# Patient Record
Sex: Female | Born: 1960 | Race: Black or African American | Hispanic: No | Marital: Single | Smoking: Former smoker
Health system: Southern US, Community
[De-identification: ages and names within clinical notes are randomized; demographics above are authoritative.]

## PROBLEM LIST (undated history)

## (undated) ENCOUNTER — Ambulatory Visit (HOSPITAL_COMMUNITY): Admission: EM | Payer: BC Managed Care – PPO | Source: Home / Self Care

## (undated) ENCOUNTER — Ambulatory Visit (HOSPITAL_COMMUNITY): Admission: EM | Payer: BC Managed Care – PPO

## (undated) DIAGNOSIS — K635 Polyp of colon: Secondary | ICD-10-CM

## (undated) DIAGNOSIS — M1990 Unspecified osteoarthritis, unspecified site: Secondary | ICD-10-CM

## (undated) DIAGNOSIS — R0981 Nasal congestion: Secondary | ICD-10-CM

## (undated) DIAGNOSIS — K5730 Diverticulosis of large intestine without perforation or abscess without bleeding: Secondary | ICD-10-CM

## (undated) DIAGNOSIS — K449 Diaphragmatic hernia without obstruction or gangrene: Secondary | ICD-10-CM

## (undated) DIAGNOSIS — I10 Essential (primary) hypertension: Secondary | ICD-10-CM

## (undated) DIAGNOSIS — Z972 Presence of dental prosthetic device (complete) (partial): Secondary | ICD-10-CM

## (undated) DIAGNOSIS — K219 Gastro-esophageal reflux disease without esophagitis: Secondary | ICD-10-CM

## (undated) DIAGNOSIS — R32 Unspecified urinary incontinence: Secondary | ICD-10-CM

## (undated) DIAGNOSIS — J302 Other seasonal allergic rhinitis: Secondary | ICD-10-CM

## (undated) DIAGNOSIS — E559 Vitamin D deficiency, unspecified: Secondary | ICD-10-CM

## (undated) DIAGNOSIS — B019 Varicella without complication: Secondary | ICD-10-CM

## (undated) DIAGNOSIS — R2231 Localized swelling, mass and lump, right upper limb: Secondary | ICD-10-CM

## (undated) HISTORY — DX: Gastro-esophageal reflux disease without esophagitis: K21.9

## (undated) HISTORY — DX: Diaphragmatic hernia without obstruction or gangrene: K44.9

## (undated) HISTORY — DX: Varicella without complication: B01.9

## (undated) HISTORY — DX: Polyp of colon: K63.5

## (undated) HISTORY — DX: Unspecified urinary incontinence: R32

## (undated) HISTORY — DX: Diverticulosis of large intestine without perforation or abscess without bleeding: K57.30

## (undated) HISTORY — PX: FOOT SURGERY: SHX648

## (undated) HISTORY — DX: Vitamin D deficiency, unspecified: E55.9

---

## 1998-04-18 ENCOUNTER — Encounter: Admission: RE | Admit: 1998-04-18 | Discharge: 1998-07-17 | Payer: Self-pay | Admitting: Internal Medicine

## 1998-10-09 ENCOUNTER — Emergency Department (HOSPITAL_COMMUNITY): Admission: EM | Admit: 1998-10-09 | Discharge: 1998-10-09 | Payer: Self-pay | Admitting: Emergency Medicine

## 1999-09-12 ENCOUNTER — Emergency Department (HOSPITAL_COMMUNITY): Admission: EM | Admit: 1999-09-12 | Discharge: 1999-09-12 | Payer: Self-pay

## 1999-11-27 ENCOUNTER — Emergency Department (HOSPITAL_COMMUNITY): Admission: EM | Admit: 1999-11-27 | Discharge: 1999-11-28 | Payer: Self-pay | Admitting: Emergency Medicine

## 2000-11-01 ENCOUNTER — Emergency Department (HOSPITAL_COMMUNITY): Admission: EM | Admit: 2000-11-01 | Discharge: 2000-11-01 | Payer: Self-pay | Admitting: Emergency Medicine

## 2001-01-30 ENCOUNTER — Emergency Department (HOSPITAL_COMMUNITY): Admission: EM | Admit: 2001-01-30 | Discharge: 2001-01-30 | Payer: Self-pay | Admitting: Vascular Surgery

## 2001-08-15 ENCOUNTER — Emergency Department (HOSPITAL_COMMUNITY): Admission: EM | Admit: 2001-08-15 | Discharge: 2001-08-15 | Payer: Self-pay | Admitting: Emergency Medicine

## 2001-12-08 ENCOUNTER — Emergency Department (HOSPITAL_COMMUNITY): Admission: EM | Admit: 2001-12-08 | Discharge: 2001-12-08 | Payer: Self-pay | Admitting: Emergency Medicine

## 2002-07-30 ENCOUNTER — Encounter: Admission: RE | Admit: 2002-07-30 | Discharge: 2002-07-30 | Payer: Self-pay | Admitting: Internal Medicine

## 2002-09-02 ENCOUNTER — Ambulatory Visit (HOSPITAL_COMMUNITY): Admission: RE | Admit: 2002-09-02 | Discharge: 2002-09-02 | Payer: Self-pay | Admitting: Obstetrics

## 2002-09-02 HISTORY — PX: TUBAL LIGATION: SHX77

## 2003-03-06 ENCOUNTER — Emergency Department (HOSPITAL_COMMUNITY): Admission: EM | Admit: 2003-03-06 | Discharge: 2003-03-06 | Payer: Self-pay | Admitting: Emergency Medicine

## 2003-03-10 ENCOUNTER — Encounter: Admission: RE | Admit: 2003-03-10 | Discharge: 2003-03-10 | Payer: Self-pay | Admitting: Internal Medicine

## 2003-03-10 ENCOUNTER — Encounter: Payer: Self-pay | Admitting: Internal Medicine

## 2003-03-16 ENCOUNTER — Encounter: Admission: RE | Admit: 2003-03-16 | Discharge: 2003-03-29 | Payer: Self-pay | Admitting: Internal Medicine

## 2004-01-27 ENCOUNTER — Encounter: Admission: RE | Admit: 2004-01-27 | Discharge: 2004-01-27 | Payer: Self-pay | Admitting: Internal Medicine

## 2004-05-13 ENCOUNTER — Emergency Department (HOSPITAL_COMMUNITY): Admission: EM | Admit: 2004-05-13 | Discharge: 2004-05-13 | Payer: Self-pay | Admitting: *Deleted

## 2004-05-17 ENCOUNTER — Encounter: Admission: RE | Admit: 2004-05-17 | Discharge: 2004-05-17 | Payer: Self-pay | Admitting: Internal Medicine

## 2005-11-10 ENCOUNTER — Emergency Department (HOSPITAL_COMMUNITY): Admission: EM | Admit: 2005-11-10 | Discharge: 2005-11-11 | Payer: Self-pay | Admitting: Emergency Medicine

## 2006-06-16 ENCOUNTER — Encounter: Admission: RE | Admit: 2006-06-16 | Discharge: 2006-06-16 | Payer: Self-pay | Admitting: Internal Medicine

## 2007-07-16 ENCOUNTER — Encounter: Admission: RE | Admit: 2007-07-16 | Discharge: 2007-07-16 | Payer: Self-pay | Admitting: Internal Medicine

## 2007-07-21 ENCOUNTER — Ambulatory Visit: Payer: Self-pay | Admitting: Gastroenterology

## 2007-07-21 LAB — CONVERTED CEMR LAB
Iron: 79 ug/dL (ref 42–145)
Saturation Ratios: 20.1 % (ref 20.0–50.0)
Transferrin: 281.4 mg/dL (ref >=212.0)
Vitamin B-12: 640 pg/mL (ref 211–911)

## 2007-08-14 ENCOUNTER — Ambulatory Visit: Payer: Self-pay | Admitting: Gastroenterology

## 2007-08-14 ENCOUNTER — Encounter: Payer: Self-pay | Admitting: Gastroenterology

## 2007-08-14 DIAGNOSIS — K210 Gastro-esophageal reflux disease with esophagitis: Secondary | ICD-10-CM

## 2007-08-14 DIAGNOSIS — K2990 Gastroduodenitis, unspecified, without bleeding: Secondary | ICD-10-CM

## 2007-08-14 DIAGNOSIS — K449 Diaphragmatic hernia without obstruction or gangrene: Secondary | ICD-10-CM | POA: Insufficient documentation

## 2007-08-14 DIAGNOSIS — D126 Benign neoplasm of colon, unspecified: Secondary | ICD-10-CM

## 2007-08-14 DIAGNOSIS — K2970 Gastritis, unspecified, without bleeding: Secondary | ICD-10-CM | POA: Insufficient documentation

## 2007-08-14 DIAGNOSIS — K5730 Diverticulosis of large intestine without perforation or abscess without bleeding: Secondary | ICD-10-CM | POA: Insufficient documentation

## 2007-08-14 HISTORY — PX: COLONOSCOPY: SHX174

## 2008-01-11 DIAGNOSIS — F329 Major depressive disorder, single episode, unspecified: Secondary | ICD-10-CM

## 2008-01-11 DIAGNOSIS — T7840XA Allergy, unspecified, initial encounter: Secondary | ICD-10-CM | POA: Insufficient documentation

## 2008-01-11 DIAGNOSIS — F410 Panic disorder [episodic paroxysmal anxiety] without agoraphobia: Secondary | ICD-10-CM

## 2008-04-23 ENCOUNTER — Emergency Department (HOSPITAL_COMMUNITY): Admission: EM | Admit: 2008-04-23 | Discharge: 2008-04-23 | Payer: Self-pay | Admitting: Emergency Medicine

## 2008-07-02 ENCOUNTER — Emergency Department (HOSPITAL_COMMUNITY): Admission: EM | Admit: 2008-07-02 | Discharge: 2008-07-03 | Payer: Self-pay | Admitting: Emergency Medicine

## 2009-06-30 ENCOUNTER — Encounter: Admission: RE | Admit: 2009-06-30 | Discharge: 2009-06-30 | Payer: Self-pay | Admitting: Internal Medicine

## 2009-08-24 ENCOUNTER — Emergency Department (HOSPITAL_COMMUNITY): Admission: EM | Admit: 2009-08-24 | Discharge: 2009-08-24 | Payer: Self-pay | Admitting: Emergency Medicine

## 2010-05-29 ENCOUNTER — Emergency Department (HOSPITAL_COMMUNITY): Admission: EM | Admit: 2010-05-29 | Discharge: 2010-05-29 | Payer: Self-pay | Admitting: Emergency Medicine

## 2010-06-12 ENCOUNTER — Ambulatory Visit: Payer: Self-pay | Admitting: Gastroenterology

## 2010-06-12 ENCOUNTER — Encounter (INDEPENDENT_AMBULATORY_CARE_PROVIDER_SITE_OTHER): Payer: Self-pay | Admitting: *Deleted

## 2010-06-12 DIAGNOSIS — R079 Chest pain, unspecified: Secondary | ICD-10-CM

## 2010-06-12 DIAGNOSIS — R109 Unspecified abdominal pain: Secondary | ICD-10-CM | POA: Insufficient documentation

## 2010-06-13 ENCOUNTER — Ambulatory Visit (HOSPITAL_COMMUNITY): Admission: RE | Admit: 2010-06-13 | Discharge: 2010-06-13 | Payer: Self-pay | Admitting: Gastroenterology

## 2010-06-13 ENCOUNTER — Ambulatory Visit: Payer: Self-pay | Admitting: Gastroenterology

## 2010-06-13 LAB — CONVERTED CEMR LAB: UREASE: NEGATIVE

## 2010-06-15 ENCOUNTER — Encounter: Payer: Self-pay | Admitting: Gastroenterology

## 2010-06-18 ENCOUNTER — Telehealth: Payer: Self-pay | Admitting: Gastroenterology

## 2010-11-27 NOTE — Procedures (Signed)
Summary: Upper Endoscopy  Patient: Marie Torres Note: All result statuses are Final unless otherwise noted.  Tests: (1) Upper Endoscopy (EGD)   EGD Upper Endoscopy       DONE     [facility]     [address]     [place], [place]  [address]           ENDOSCOPY PROCEDURE REPORT           PATIENT:  Marie, Torres  MR#:  [id number]     BIRTHDATE:  1960/11/26, 49 yrs. old  GENDER:  female           ENDOSCOPIST:  Vania Rea. Jarold Motto, MD, [facility]     Referred by:           PROCEDURE DATE:  06/13/2010     PROCEDURE:  EGD with biopsy, Elease Hashimoto Dilation of Esophagus     ASA CLASS:  Class II     INDICATIONS:  abdominal pain, chest pain, dysphagia           MEDICATIONS:   Fentanyl 50 mcg IV, Versed 5 mg IV     TOPICAL ANESTHETIC:  Exactacain Spray           DESCRIPTION OF PROCEDURE:   After the risks benefits and     alternatives of the procedure were thoroughly explained, informed     consent was obtained.  The LB GIF-H180 [id number] endoscope was     introduced through the mouth and advanced to the second portion of     the duodenum, without limitations.  The instrument was slowly     withdrawn as the mucosa was fully examined.     <<PROCEDUREIMAGES>>           Duodenitis was found in the bulb and descending duodenum. EROSIONS     IN BULB.  Mild gastritis was found in the body and the antrum of     the stomach. REGULAR AND CLO DONE.  A hiatal hernia was found. 5     CM. NOTED.ESOPHAGEAL STRICTURE DILATED.#66F MALONEY DILATOR.     Retroflexed views revealed a hiatal hernia.  5CM.HH NOTED.  The scope     was then withdrawn from the patient and the procedure completed.           COMPLICATIONS:  None           ENDOSCOPIC IMPRESSION:     1) Duodenitis in the bulb/descending duodenum     2) Mild gastritis in the body and the antrum of the stomach     3) Hiatal hernia     CHRONIC GERD AND STRICTURE DILATED.R/O H.PYLORI     GASTRODUODENITIS.     RECOMMENDATIONS:     1) Await  biopsy results     2) Rx CLO if positive     3) continue PPI           REPEAT EXAM:  No           ______________________________     Vania Rea. Jarold Motto, MD, [facility] Graham           CC:  Creola Corn, MD           n.     Rosalie Doctor:   Vania Rea. Geanna Divirgilio at 06/13/2010 03:59 PM           Julene, Rahn, [id number]  Note: An exclamation mark (!) indicates a result that was not dispersed into the flowsheet. Document Creation Date: 06/13/2010 4:01 PM _______________________________________________________________________  Marland Kitchen  1) Order result status: Final Collection or observation date-time: 06/13/2010 15:37 Requested date-time:  Receipt date-time:  Reported date-time:  Referring Physician:   Ordering Physician: Sheryn Bison [phone]) Specimen Source:  Source: Launa Grill Order Number: [id number] Lab site:

## 2010-11-27 NOTE — Letter (Signed)
Summary: EGD Instructions  [facility]  [address] [place], [place] [address]   Phone: [phone]  Fax: [phone]       Marie Torres    02-Mar-1961    MRN: [id number]       Procedure Day /Date:WEDNESDAY 06/13/2010     Arrival Time: 2PM     Procedure Time:3PM     Location of Procedure:                    X  [facility] (4th Floor)    PREPARATION FOR ENDOSCOPY   On 06/13/2010  THE DAY OF THE PROCEDURE:  1.   No solid foods, milk or milk products are allowed after midnight the night before your procedure.  2.   Do not drink anything colored red or purple.  Avoid juices with pulp.  No orange juice.  3.  You may drink clear liquids until1PM, which is 2 hours before your procedure.                                                                                                CLEAR LIQUIDS INCLUDE: Water Jello Ice Popsicles Tea (sugar ok, no milk/cream) Powdered fruit flavored drinks Coffee (sugar ok, no milk/cream) Gatorade Juice: apple, white grape, white cranberry  Lemonade Clear bullion, consomm, broth Carbonated beverages (any kind) Strained chicken noodle soup Hard Candy   MEDICATION INSTRUCTIONS  Unless otherwise instructed, you should take regular prescription medications with a small sip of water as early as possible the morning of your procedure.             OTHER INSTRUCTIONS  You will need a responsible adult at least 51 years of age to accompany you and drive you home.   This person must remain in the waiting room during your procedure.  Wear loose fitting clothing that is easily removed.  Leave jewelry and other valuables at home.  However, you may wish to bring a book to read or an iPod/MP3 player to listen to music as you wait for your procedure to start.  Remove all body piercing jewelry and leave at home.  Total time from sign-in until discharge is approximately 2-3 hours.  You should go home directly  after your procedure and rest.  You can resume normal activities the day after your procedure.  The day of your procedure you should not:   Drive   Make legal decisions   Operate machinery   Drink alcohol   Return to work  You will receive specific instructions about eating, activities and medications before you leave.    The above instructions have been reviewed and explained to me by   _______________________    I fully understand and can verbalize these instructions _____________________________ Date _________

## 2010-11-27 NOTE — Miscellaneous (Signed)
Summary: Orders Update  Clinical Lists Changes  Orders: Added new Test order of TLB-H Pylori Screen Gastric Biopsy (83013-CLOTEST) - Signed 

## 2010-11-27 NOTE — Progress Notes (Signed)
Summary: Triage  Medications Added LIDOCAINE VISCOUS 2 % SOLN (LIDOCAINE HCL) 15 ml q 4 hrs as needed       Phone Note Call from Patient   Caller: Patient Summary of Call: Pt calling,  [phone] )  Had EGD with esoph dilitation on 06/13/10.  Since the procedure she has the feeling that there is a knot in her throat.   Only notices this when eating solid food.  Does not notice this feeling with liquids.  Pt currently taking Dexilant.    Initial call taken by: Ashok Cordia RN,  June 18, 2010 12:10 PM  Follow-up for Phone Call        Pt notified.   Follow-up by: Ashok Cordia RN,  June 18, 2010 3:15 PM    Additional Follow-up for Phone Call Additional follow up Details #2::    as needed viscous xylocaine Follow-up by: Mardella Layman MD Clementeen Graham,  June 18, 2010 12:30 PM  New/Updated Medications: LIDOCAINE VISCOUS 2 % SOLN (LIDOCAINE HCL) 15 ml q 4 hrs as needed Prescriptions: LIDOCAINE VISCOUS 2 % SOLN (LIDOCAINE HCL) 15 ml q 4 hrs as needed  #450 ml x 1   Entered by:   Ashok Cordia RN   Authorized by:   Mardella Layman MD [facility]   Signed by:   Ashok Cordia RN on 06/18/2010   Method used:   Electronically to        [facility] [phone]* (retail)       [address]       [place], [place]  [address]       Ph: [phone]       Fax: [phone]   RxID:   [id number]

## 2010-11-27 NOTE — Assessment & Plan Note (Signed)
Summary: gas pain...as.    History of Present Illness Visit Type: Follow-up Visit Primary GI MD: Sheryn Bison MD FACP FAGA Primary Provider: Candis Schatz Requesting Provider: n/a Chief Complaint: Indisestion and gas, Micah Flesher to [facility] ER on 8/2 History of Present Illness:   50 year old African American female who presented to the emergency room on August 2 with acute substernal chest pain and some dysphasia. Lab data and examination was unremarkable and she was placed on daily Protonix. She continues with belching, burping, and vague substernal chest pain without specific hepatobiliary or cardiovascular or pulmonary complaints.  She did have an endoscopy done several years ago which showed a prominent hiatal hernia and erosive esophagitis. She apparently has been using over-the-counter H2 blockers on a p.r.n. basis. Family history is remarkable for cholelithiasis in her mother.She occasionally has fecal incontinence he would seem to be related to ingestion of oral sorbitol-fructose. She has had previous colonoscopy.   GI Review of Systems    Reports chest pain and  heartburn.      Denies abdominal pain, acid reflux, belching, bloating, dysphagia with liquids, dysphagia with solids, loss of appetite, nausea, vomiting, vomiting blood, weight loss, and  weight gain.        Denies anal fissure, black tarry stools, change in bowel habit, constipation, diarrhea, diverticulosis, fecal incontinence, heme positive stool, hemorrhoids, irritable bowel syndrome, jaundice, light color stool, liver problems, rectal bleeding, and  rectal pain. Preventive Screening-Counseling & Management  Alcohol-Tobacco     Smoking Status: quit      Drug Use:  no.      Current Medications (verified): 1)  Pepcid 20 Mg Tabs (Famotidine) .Marland Kitchen.. 1 By Mouth For 20 Days  Allergies (verified): No Known Drug Allergies  Past History:  Past medical, surgical, family and social histories (including risk factors)  reviewed for relevance to current acute and chronic problems.  Past Medical History: Reviewed history from 01/11/2008 and no changes required. Current Problems:  POLYP, COLON (ICD-211.3) DIVERTICULOSIS, COLON (ICD-562.10) GASTRITIS (ICD-535.50) ESOPHAGITIS, REFLUX (ICD-530.11) HIATAL HERNIA (ICD-553.3) ALLERGY (ICD-995.3) DEPRESSION (ICD-311) PANIC DISORDER (ICD-300.01)  Past Surgical History: Reviewed history from 01/11/2008 and no changes required. orthopedic surgery both feet  Family History: Reviewed history and no changes required. No FH of Colon Cancer: Family History of Heart Disease: mother, father Family History of Kidney Disease: father  Social History: Reviewed history and no changes required. Patient is a former smoker.  Alcohol Use - no Illicit Drug Use - no Daily Caffeine Use coffee every day Occupation:  assembly line Smoking Status:  quit Drug Use:  no  Review of Systems       The patient complains of allergy/sinus.  The patient denies anemia, anxiety-new, arthritis/joint pain, back pain, blood in urine, breast changes/lumps, change in vision, confusion, cough, coughing up blood, depression-new, fainting, fatigue, fever, headaches-new, hearing problems, heart murmur, heart rhythm changes, itching, menstrual pain, muscle pains/cramps, night sweats, nosebleeds, pregnancy symptoms, shortness of breath, skin rash, sleeping problems, sore throat, swelling of feet/legs, swollen lymph glands, thirst - excessive , urination - excessive , urination changes/pain, urine leakage, vision changes, and voice change.    Vital Signs:  Patient profile:   50 year old female Height:      62 inches Weight:      198 pounds BMI:     36.35 BSA:     1.91 Pulse rate:   80 / minute Pulse rhythm:   regular BP sitting:   110 / 80  (left arm)  Vitals  Entered By: Merri Ray CMA Duncan Dull) (June 12, 2010 11:03 AM)  Physical Exam  General:  Well developed, well nourished, no  acute distress.healthy appearing and obese.   Head:  Normocephalic and atraumatic. Eyes:  PERRLA, no icterus.exam deferred to patient's ophthalmologist.   Lungs:  Clear throughout to auscultation. Heart:  Regular rate and rhythm; no murmurs, rubs,  or bruits. Abdomen:  Soft, nontender and nondistended. No masses, hepatosplenomegaly or hernias noted. Normal bowel sounds.obese.   Extremities:  No clubbing, cyanosis, edema or deformities noted. Neurologic:  Alert and  oriented x4;  grossly normal neurologically. Cervical Nodes:  No significant cervical adenopathy. Psych:  Alert and cooperative. Normal mood and affect.   Impression & Recommendations:  Problem # 1:  CHEST PAIN (ICD-786.50) Assessment Improved Probable erosive esophagitis. Because of her dysphasia, we will proceed with endoscopy to exclude an esophageal stricture. I have increased her Protonix to 40 mg twice a day and we'll schedule ultrasound to exclude cholelithiasis. P.r.n. GI cocktail also ordered to be used as needed. Review of her CBC, and metabolic profile from the ER shows these labs to be normal.  Problem # 2:  POLYP, COLON (ICD-211.3) Assessment: Improved hyperplastic polyps on previous colonoscopy. I have asked her to avoid sorbitol infiltration her diet because of her intermittent diarrhea and fecal incontinency  Problem # 3:  DIVERTICULOSIS, COLON (ICD-562.10) Assessment: Unchanged  Problem # 4:  DEPRESSION (ICD-311) Assessment: Improved She apparently is not on antidepressant medication at this time.  Patient Instructions: 1)  Copy sent to : Dr. Creola Corn 2)  Please continue current medications.  3)  Avoid foods high in acid content ( tomatoes, citrus juices, spicy foods) . Avoid eating within 3 to 4 hours of lying down or before exercising. Do not over eat; try smaller more frequent meals. Elevate head of bed four inches when sleeping.  4)  Conscious Sedation brochure given.  5)  Upper Endoscopy with  Dilatation brochure given.  6)  upper abdominal ultrasound exam scheduled 7)  Avoid sorbitol-fructose and diet  Appended Document: Orders Update    Clinical Lists Changes  Problems: Added new problem of ABDOMINAL PAIN (ICD-789.00) Orders: Added new Test order of EGD (EGD) - Signed Added new Test order of Ultrasound Abdomen (UAS) - Signed      Appended Document: gas pain...as.    Clinical Lists Changes  Medications: Added new medication of PROTONIX 40 MG  TBEC (PANTOPRAZOLE SODIUM) 1 by mouth two times a day - Signed Added new medication of LEVSIN/SL 0.125 MG  SUBL (HYOSCYAMINE SULFATE) 1 SL q 4-6 hrs as needed - Signed Rx of PROTONIX 40 MG  TBEC (PANTOPRAZOLE SODIUM) 1 by mouth two times a day;  #60 x 6;  Signed;  Entered by: Ashok Cordia RN;  Authorized by: Mardella Layman MD [facility];  Method used: Electronically to [facility] [phone]*, [address] [place], [place]  [address], Ph: [phone], Fax: [phone] Rx of LEVSIN/SL 0.125 MG  SUBL (HYOSCYAMINE SULFATE) 1 SL q 4-6 hrs as needed;  #60 x 3;  Signed;  Entered by: Ashok Cordia RN;  Authorized by: Mardella Layman MD [facility];  Method used: Electronically to [facility] [phone]*, [address], [place], [place]  [address], Ph: [phone], Fax: [phone]    Prescriptions: LEVSIN/SL 0.125 MG  SUBL (HYOSCYAMINE SULFATE) 1 SL q 4-6 hrs as needed  #60 x 3   Entered by:   Ashok Cordia RN   Authorized by:   Vania Rea  Jarold Motto MD [facility]   Signed by:   Ashok Cordia RN on 06/12/2010   Method used:   Electronically to        [facility] [phone]* (retail)       [address]       [place], [place]  [address]       Ph: [phone]       Fax: [phone]   RxID:   [id number] PROTONIX 40 MG  TBEC (PANTOPRAZOLE SODIUM) 1 by mouth two times a day  #60 x 6   Entered by:   Ashok Cordia RN   Authorized by:   Mardella Layman MD [facility]   Signed by:   Ashok Cordia RN on 06/12/2010   Method used:    Electronically to        [facility] [phone]* (retail)       [address]       [place], [place]  [address]       Ph: [phone]       Fax: [phone]   RxID:   [id number]

## 2010-11-27 NOTE — Letter (Signed)
Summary: Patient [facility] Biopsy Results   Gastroenterology  [address] [place], [place] [address]   Phone: [phone]  Fax: [phone]        June 15, 2010 MRN: [id number]    [facility] [address] [place], [place]  [address]    Dear Ms. Hergert,  I am pleased to inform you that the biopsies taken during your recent endoscopic examination did not show any evidence of cancer upon pathologic examination.  Additional information/recommendations:  __No further action is needed at this time.  Please follow-up with      your primary care physician for your other healthcare needs.  __ Please call [phone] to schedule a return visit to review      your condition.  X__ Continue with the treatment plan as outlined on the day of your      exam.BIOPSY NEGATIVE FOR HELICOBACTER INFECTION.  __ You should have a repeat endoscopic examination for this problem              in _ months/years.   Please call us if you are having persistent problems or have questions about your condition that have not been fully answered at this time.  Sincerely,  Mardella Layman MD [facility]  This letter has been electronically signed by your physician.  Appended Document: Patient Notice-Endo Biopsy Results letter mailed

## 2011-01-11 LAB — DIFFERENTIAL
Basophils Relative: 0 % (ref 0–1)
Eosinophils Absolute: 0.2 10*3/uL (ref 0.0–0.7)
Lymphocytes Relative: 37 % (ref 12–46)
Lymphs Abs: 3 10*3/uL (ref 0.7–4.0)
Monocytes Relative: 6 % (ref 3–12)
Neutrophils Relative %: 54 % (ref 43–77)

## 2011-01-11 LAB — COMPREHENSIVE METABOLIC PANEL
GFR calc non Af Amer: >60 mL/min (ref >60)
Total Protein: 7.6 g/dL (ref 6.0–8.3)

## 2011-01-11 LAB — POCT CARDIAC MARKERS: Troponin i, poc: <0.05 ng/mL (ref 0.00–0.09)

## 2011-01-11 LAB — POCT I-STAT, CHEM 8
BUN: 13 mg/dL (ref 6–23)
Calcium, Ion: 1.19 mmol/L (ref 1.12–1.32)
Chloride: 107 mEq/L (ref 96–112)
Creatinine, Ser: 1 mg/dL (ref 0.4–1.2)
Glucose, Bld: 107 mg/dL — ABNORMAL HIGH (ref 70–99)
Hemoglobin: 13.9 g/dL (ref 12.0–15.0)
Potassium: 3.6 mEq/L (ref 3.5–5.1)
Sodium: 143 mEq/L (ref 135–145)

## 2011-01-11 LAB — CBC
HCT: 38.2 % (ref 36.0–46.0)
MCV: 86.5 fL (ref 78.0–100.0)
Platelets: 300 10*3/uL (ref 150–400)
RDW: 12.2 % (ref 11.5–15.5)

## 2011-01-11 LAB — URINALYSIS, ROUTINE W REFLEX MICROSCOPIC
Glucose, UA: NEGATIVE mg/dL
Hgb urine dipstick: NEGATIVE
Nitrite: NEGATIVE

## 2011-01-31 LAB — URINE CULTURE: Colony Count: 35000

## 2011-01-31 LAB — POCT I-STAT, CHEM 8
Calcium, Ion: 1.18 mmol/L (ref 1.12–1.32)
Chloride: 107 mEq/L (ref 96–112)
Hemoglobin: 13.9 g/dL (ref 12.0–15.0)
TCO2: 24 mmol/L (ref 0–100)

## 2011-01-31 LAB — URINALYSIS, ROUTINE W REFLEX MICROSCOPIC
Ketones, ur: NEGATIVE mg/dL
Nitrite: NEGATIVE
Protein, ur: NEGATIVE mg/dL
Specific Gravity, Urine: 1.031 — ABNORMAL HIGH (ref 1.005–1.030)

## 2011-03-12 NOTE — Assessment & Plan Note (Signed)
[facility]                         GASTROENTEROLOGY OFFICE NOTE   NAME:Marie Torres, Marie Torres                      MRN:          [id number]  DATE:07/21/2007                            DOB:          10/08/1961    REASON FOR CONSULTATION:  Ms. Ragan is a 50 year old African/American  female referred through the courtesy of Dr. Gwen Pounds for evaluation  of guaiac-positive stools found on Hemoccult testing.   HISTORY:  Ms. Jaskolski has been in good health most of her life, except  for some minor problems, including a history of anxiety and depression.  She has been having normal gastrointestinal function until approximately  1-1/2 months ago when she developed severe watery diarrhea which seemed  to correlate with a cruise to [place], but also after taking courses of  antibiotics for recurrent sinusitis.  She currently is on Levaquin but  denies any GI cramping or diarrhea at this time.  I cannot see where she  was treated for C. Difficile or had any other gastrointestinal  interventions performed.  She is currently eating a regular diet but  does have known lactose intolerance.  She complains of recurrent  gastroesophageal reflux disease, problems with regurgitation and burning  substernal chest pain, but has not been on H2 blockers or PPI's.  She  denies dysphagia or any hepatobiliary complaints, anorexia or weight  loss.  When she was having her diarrhea episodes, she denied rectal  bleeding or melena.  She does have a history of prolapsing hemorrhoids  and has to push them back in frequently.  She denies any hepatobiliary  problems or history of pancreatitis or hepatitis.  She denies the abuse  of Sorbitol or fructose.   PAST MEDICAL/SURGICAL HISTORY:  Otherwise, except for a history of panic  disorder and depression, is unremarkable.  She has had some orthopedic  surgery on both feet.   FAMILY HISTORY:  Noncontributory.   SOCIAL HISTORY:  The patient  is single and lives with her son.  She has  a 12th grade education and works as a International aid/development worker.  She does  not smoke or use ethanol.   REVIEW OF SYSTEMS:  Noncontributory except for a frequent nonproductive  cough and some shortness of breath on exertion.  Last menstrual period  was June 22, 2007.  She denies any current cardiovascular or pulmonary  complaints otherwise.   MEDICATIONS:  None except for p.r.n. Tessalon Perles.   LABORATORY DATA:  From June 12, 2007, shows a normal CBC and metabolic  profile except for a low serum albumin of 3.1 grams percent.   PHYSICAL EXAMINATION:  GENERAL:  She is a healthy-appearing middle-aged  black female, appearing her stated age, in no acute distress.  VITAL SIGNS:  She is 5 feet 1 inch tall and weighs 201 pounds.  Blood  pressure 86/60, pulse 80 and regular.  HEENT:  I could not appreciate stigmata of chronic liver disease or  thyromegaly.  CHEST:  Clear to percussion and auscultation.  She appeared to be in a  regular rhythm without murmurs, gallops or rubs.  ABDOMEN:  There was  no real hepatosplenomegaly, abdominal masses or  tenderness.  Bowel sounds were normal.  NEUROLOGIC:  Mental status was clear.  EXTREMITIES:  Unremarkable.  RECTAL:  Deferred.   ASSESSMENT:  1. Guaiac-positive stools, of unexplained etiology in a patient who      has had a recent rather severe episode of prolonged diarrhea which      may be related to antibiotics:  She certainly is having no active      diarrhea at this time and has no symptoms suggestive of underlying      inflammatory bowel disease.  We of course need to exclude      underlying colonic polyposis.  2. Chronic gastroesophageal reflux disease and possible upper      gastrointestinal source of guaiac-positive stools.  3. Recurrent sinusitis and antibiotic use.  4. History of anxiety and depression.  5. Low serum albumin, of unexplained etiology.   RECOMMENDATIONS:  1. Reflux  regimen with a trial of Nexium 40 mg 30 minutes before      supper.  2. Trial of Align probiotic therapy for several weeks.  3. Outpatient colonoscopy and possible endoscopy.  4. Check iron studies.  5. Continue other medications per Dr. Timothy Lasso.     Vania Rea. Jarold Motto, MD, [facility], FAGA  Electronically Signed    DRP/MedQ  DD: 07/21/2007  DT: 07/21/2007  Job #: [id number]   cc:   Gwen Pounds, MD

## 2011-03-15 NOTE — Op Note (Signed)
   Marie Torres, Marie Torres                         ACCOUNT NO.:  [id number]   MEDICAL RECORD NO.:  [id number]                   PATIENT TYPE:  AMB   LOCATION:  SDC                                  FACILITY:  [facility]   PHYSICIAN:  Kathreen Cosier, M.D.           DATE OF BIRTH:  10-03-1961   DATE OF PROCEDURE:  DATE OF DISCHARGE:                                 OPERATIVE REPORT   PREOPERATIVE DIAGNOSES:  Multiparity.   PROCEDURE:  Laparoscopic tubal sterilization.  Under general anesthesia the  patient in lithotomy position, abdomen, perineum, and vagina prepped and  draped.  Bladder emptied with straight catheter.  Weighted speculum placed  in the vagina.  Anterior lip of the cervix grasped with a Hulka tenaculum.  In the umbilicus a transverse incision made, carried down to the fascia.  Fascia cleaned, grasped with two Kochers.  Fascia of peritoneum opened Mayo  scissors.  Sleeve with a trocar inserted intraperitoneally.  3 L carbon  dioxide infused intraperitoneally.  Visualizing scope inserted.  Uterus is  normal and ovaries are normal.  Both tubes surround that.  Filmy adhesions  and both tubes were enlarged.  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  Right  tube was grasped 1 inch from the cornu and cauterized in a total of four  places moving lateral from the first site of cautery.  Procedure done in a  similar fashion on the other side.  Probes removed.  CO2 allowed to escape  from the peritoneal cavity.  Fascia closed with one stitch of 0 Dexon and  skin closed with subcuticular stitch of 0 plain.  The patient tolerated  procedure well.  Taken to recovery room in good condition.                                               Kathreen Cosier, M.D.    BAM/MEDQ  D:  09/02/2002  T:  09/02/2002  Job:  [id number]

## 2011-04-02 ENCOUNTER — Emergency Department (HOSPITAL_COMMUNITY): Payer: BC Managed Care – PPO

## 2011-04-02 ENCOUNTER — Emergency Department (HOSPITAL_COMMUNITY)
Admission: EM | Admit: 2011-04-02 | Discharge: 2011-04-02 | Disposition: A | Discharge status: Home / Self Care | Payer: BC Managed Care – PPO | Attending: Emergency Medicine | Admitting: Emergency Medicine

## 2011-04-02 DIAGNOSIS — K219 Gastro-esophageal reflux disease without esophagitis: Secondary | ICD-10-CM | POA: Insufficient documentation

## 2011-04-02 DIAGNOSIS — R079 Chest pain, unspecified: Secondary | ICD-10-CM | POA: Insufficient documentation

## 2011-04-02 DIAGNOSIS — R071 Chest pain on breathing: Secondary | ICD-10-CM | POA: Insufficient documentation

## 2011-04-02 DIAGNOSIS — R42 Dizziness and giddiness: Secondary | ICD-10-CM | POA: Insufficient documentation

## 2011-04-02 LAB — COMPREHENSIVE METABOLIC PANEL
AST: 16 U/L (ref 0–37)
Albumin: 3.4 g/dL — ABNORMAL LOW (ref 3.5–5.2)
BUN: 13 mg/dL (ref 6–23)
Calcium: 9.2 mg/dL (ref 8.4–10.5)
Chloride: 104 mEq/L (ref 96–112)
Creatinine, Ser: 0.88 mg/dL (ref 0.4–1.2)
GFR calc Af Amer: >60 mL/min (ref >60)
GFR calc non Af Amer: >60 mL/min (ref >60)
Total Bilirubin: 0.3 mg/dL (ref 0.3–1.2)

## 2011-04-02 LAB — DIFFERENTIAL
Eosinophils Absolute: 0.2 10*3/uL (ref 0.0–0.7)
Neutro Abs: 5.5 10*3/uL (ref 1.7–7.7)
Neutrophils Relative %: 64 % (ref 43–77)

## 2011-04-02 LAB — CBC
HCT: 36.9 % (ref 36.0–46.0)
Hemoglobin: 12.2 g/dL (ref 12.0–15.0)
MCHC: 33.1 g/dL (ref 30.0–36.0)
Platelets: 314 10*3/uL (ref 150–400)
RBC: 4.37 MIL/uL (ref 3.87–5.11)
WBC: 8.7 10*3/uL (ref 4.0–10.5)

## 2011-04-02 LAB — CK TOTAL AND CKMB (NOT AT ARMC): CK, MB: 2.5 ng/mL (ref 0.3–4.0)

## 2011-04-02 LAB — TROPONIN I: Troponin I: <0.3 ng/mL (ref <0.30)

## 2011-04-19 ENCOUNTER — Ambulatory Visit (INDEPENDENT_AMBULATORY_CARE_PROVIDER_SITE_OTHER): Payer: BC Managed Care – PPO | Admitting: Gastroenterology

## 2011-04-19 ENCOUNTER — Encounter: Payer: Self-pay | Admitting: Gastroenterology

## 2011-04-19 VITALS — BP 128/64 | HR 80 | Ht 61.0 in | Wt 198.0 lb

## 2011-04-19 DIAGNOSIS — K219 Gastro-esophageal reflux disease without esophagitis: Secondary | ICD-10-CM

## 2011-04-19 MED ORDER — SUCRALFATE 1 GM/10ML PO SUSP: 1.0000 g | Freq: Four times a day (QID) | ORAL | Status: DC

## 2011-04-19 MED ORDER — ESOMEPRAZOLE MAGNESIUM 40 MG PO CPDR: 40.0000 mg | DELAYED_RELEASE_CAPSULE | Freq: Every day | ORAL | Status: DC

## 2011-04-19 MED ORDER — HYOSCYAMINE SULFATE 0.125 MG SL SUBL: 0.1250 mg | SUBLINGUAL_TABLET | SUBLINGUAL | Status: DC | PRN

## 2011-04-19 NOTE — Progress Notes (Addendum)
This is a very nice 50 year old African American female who  who has had relapsing reflux symptoms off PPI therapy. Previous endoscopic exams and showed evidence of a large hiatal hernia, negative abdominal ultrasound, and she recent had CT scan of her abdomen and pelvis on 04/17/2011 per Dr. Timothy Lasso was unremarkable except for colonic diverticulosis and a small periumbilical fascial defect. Patient mostly has regurgitation, burning substernal pain, no specific cardiovascular complaints. Apparently she was seen in the emergency room had a negative EKG. She does take ibuprofen 600 mg a day for arthritic complaints. She denies any specific hepatobiliary or lower gastrointestinal issues such as melena, hematochezia, rectal bleeding.  Current Medications, Allergies, Past Medical History, Past Surgical History, Family History and Social History were reviewed in [employer] record.  Pertinent Review of Systems Negative.. no history of Raynaud's phenomenon collagen vascular disease. She also not in current cardiovascular or pulmonary or systemic complaints.   Physical Exam: Awake and alert no acute distress appearing her stated age. I cannot appreciate stigmata of chronic liver disease. Chest is clear cardiac exam shows no murmurs gallops or rubs, she appear to be in a regular rhythm. I cannot appreciate hepatomegaly, abdominal masses, tenderness, or abnormal bowel sounds. Rectal exam is deferred. Peripheral extremities are unremarkable mental status is clear.    Assessment and Plan: Known large hiatal hernia with recurrent acid reflux and esophageal spasm. I placed her on Nexium 40 mg every morning with when necessary liquid Carafate suspension, when necessary sublingual Levsin, and reviewed in detail and anti-reflex she with her. Patient education video on acid reflux and its management shown to patient. We will follow her when necessary basis as needed. He is up-to-date on her endoscopy  and colonoscopy exams.  Please copy her primary care physician, referring physician, and pertinent subspecialists. Encounter Diagnosis  Name Primary?  . Esophageal reflux Yes

## 2011-04-19 NOTE — Patient Instructions (Signed)
Your prescription(s) have been sent to you pharmacy.  Today you have watched the movie on gerd.

## 2011-04-22 ENCOUNTER — Encounter: Payer: Self-pay | Admitting: Gastroenterology

## 2011-05-23 IMAGING — US US ABDOMEN COMPLETE
1 series · 14 of 25 positions shown · non-contrast
Comparison: None

CLINICAL DATA: Abdomen pain

COMPLETE ABDOMINAL ULTRASOUND

[Series 1: us abdomen complete · 0.28mm/px · 14 of 80 slices shown]
[im 1/80]
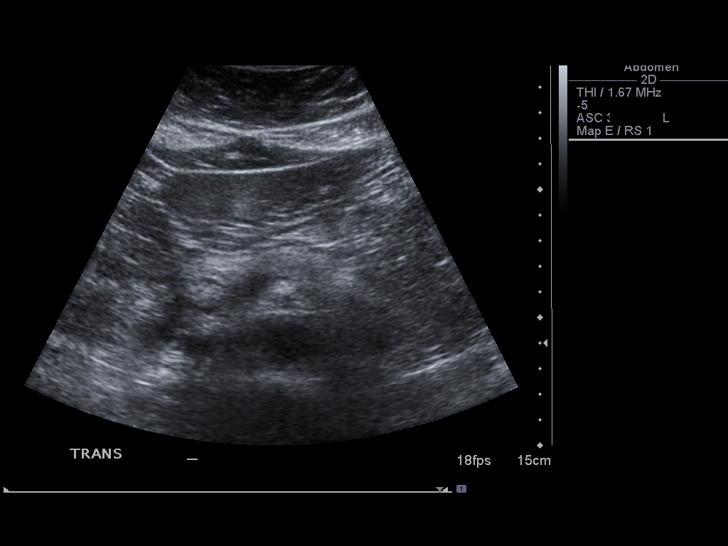
[im 7/80]
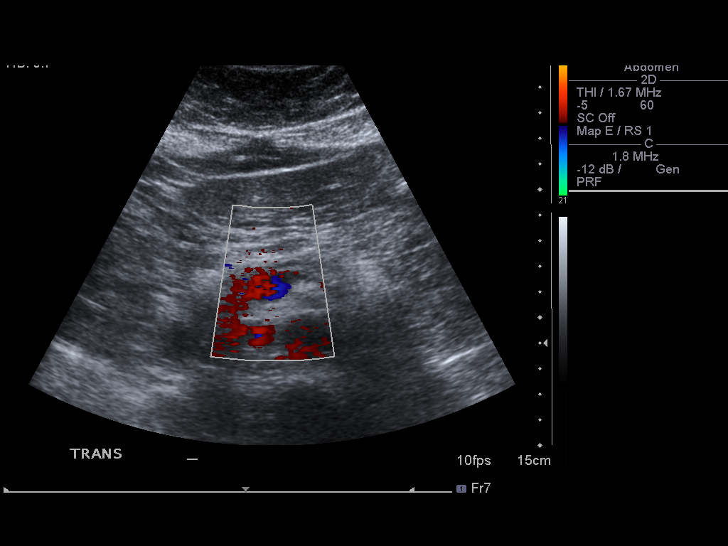
[im 14/80]
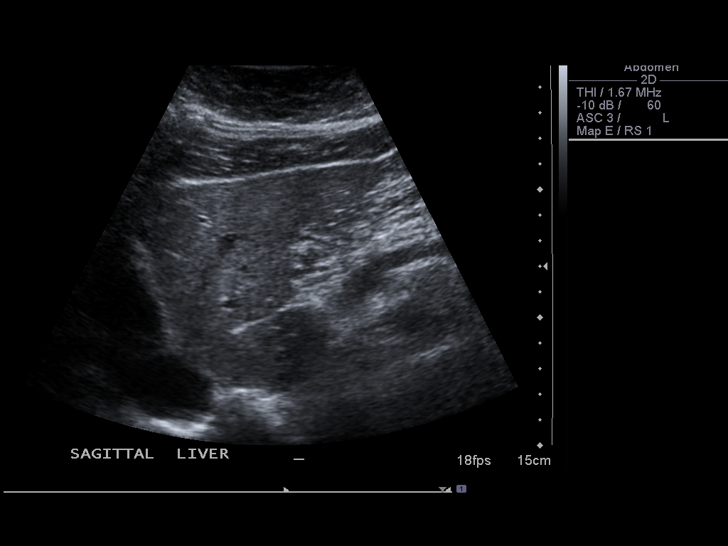
[im 20/80]
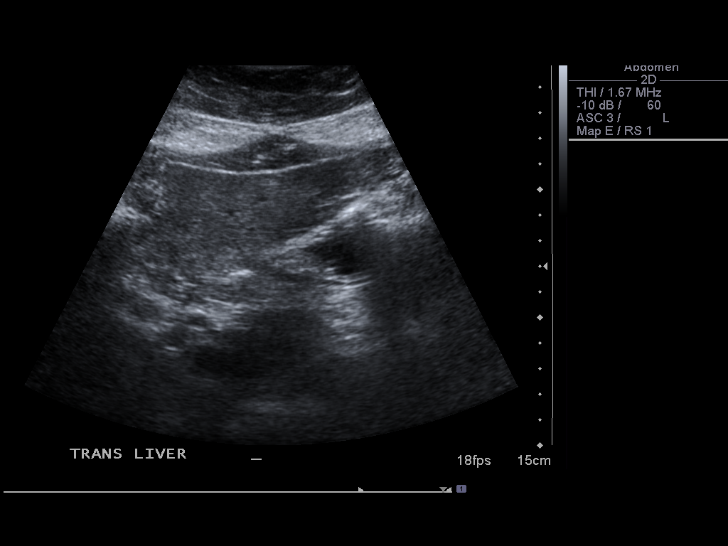
[im 27/80]
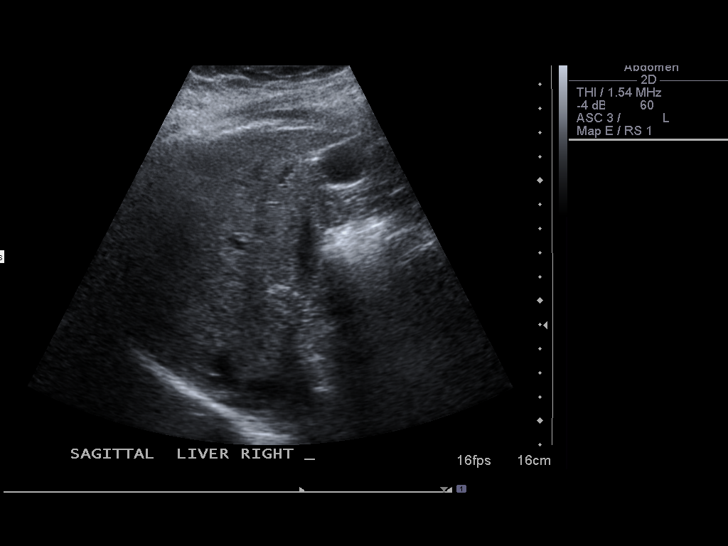
[im 30/80]
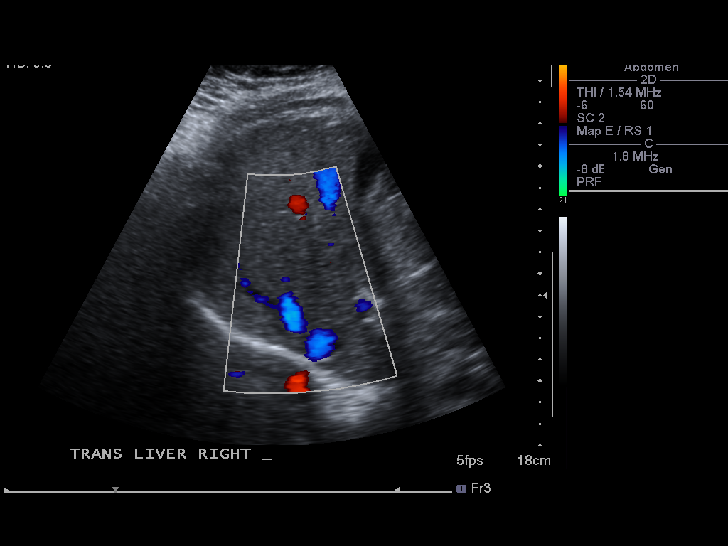
[im 37/80]
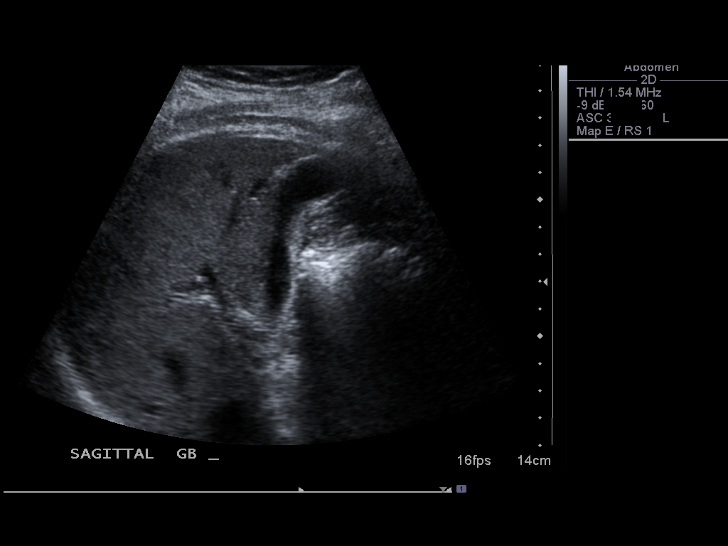
[im 43/80]
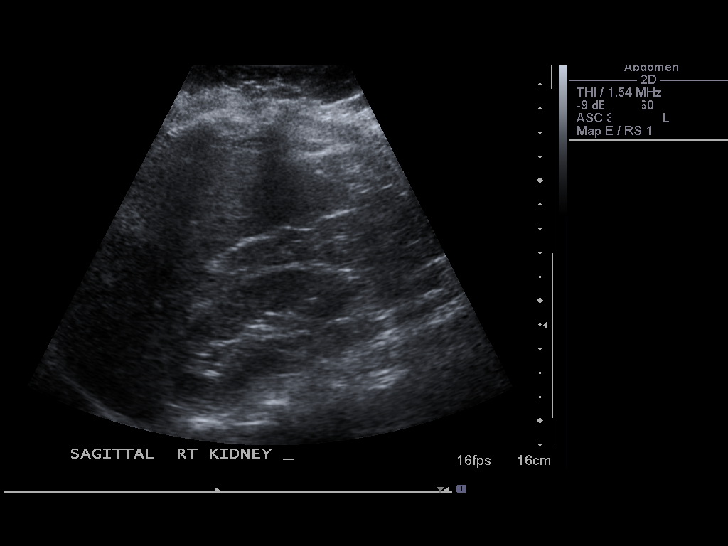
[im 50/80]
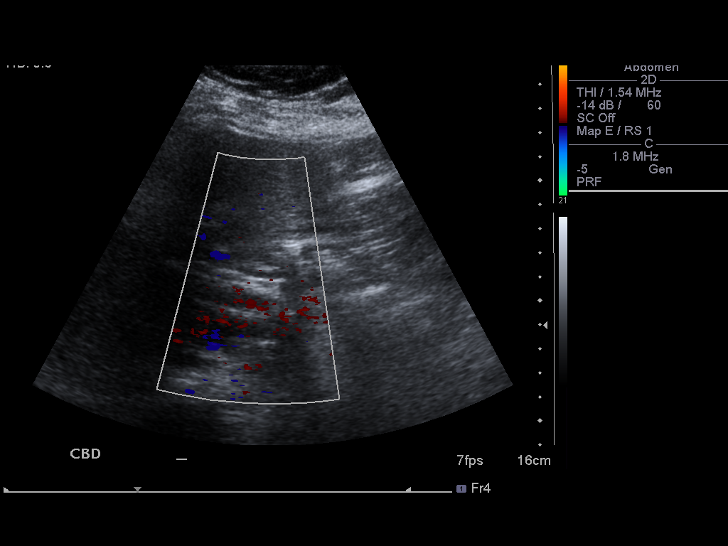
[im 53/80]
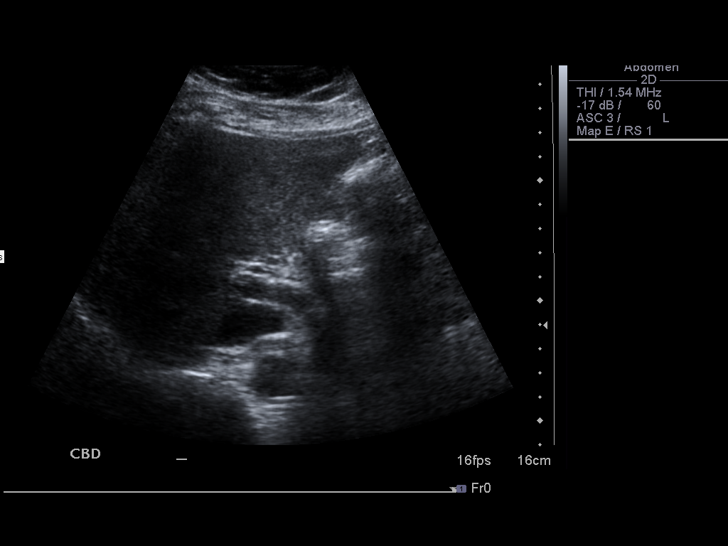
[im 60/80]
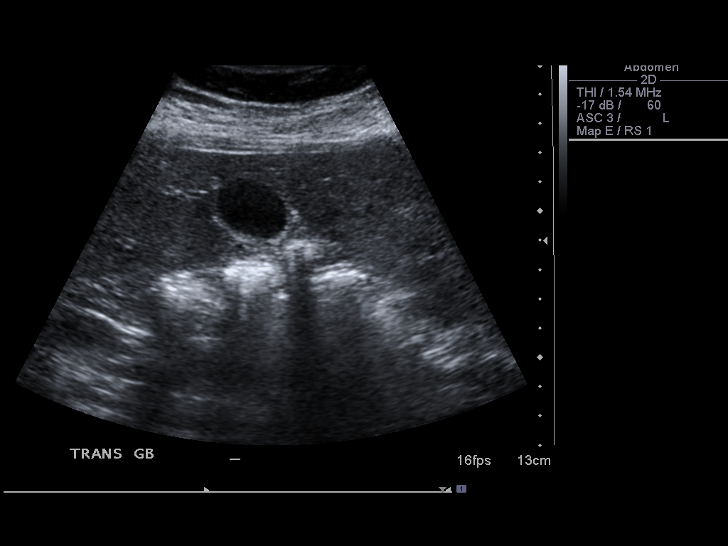
[im 66/80]
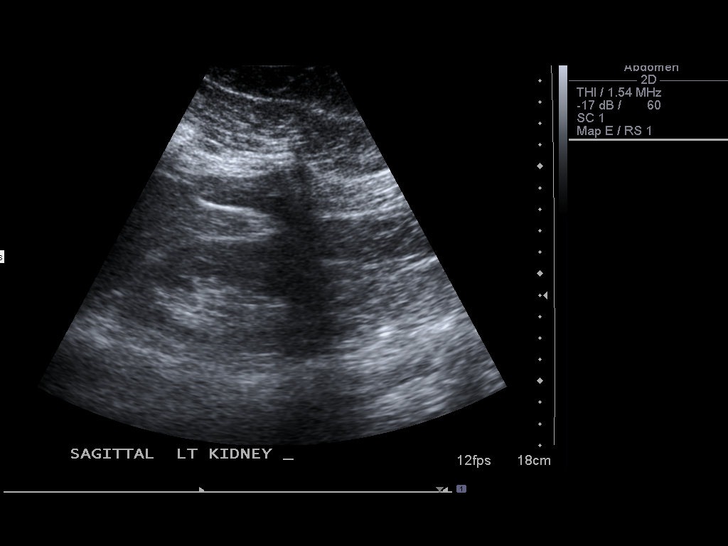
[im 73/80]
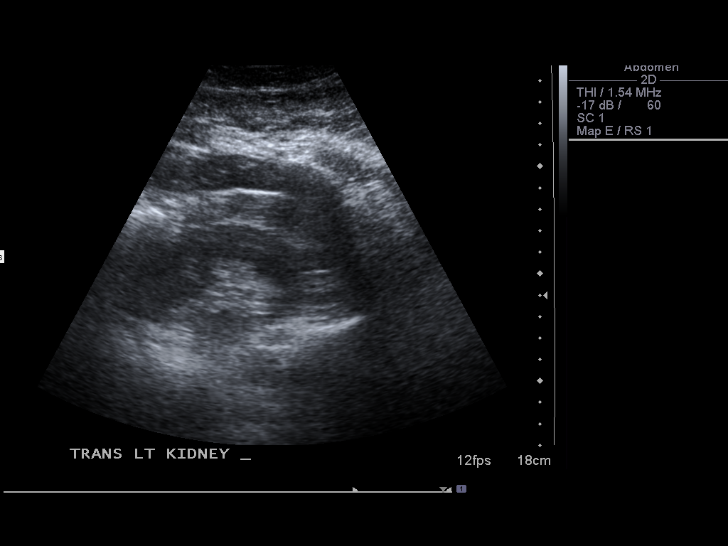
[im 80/80]
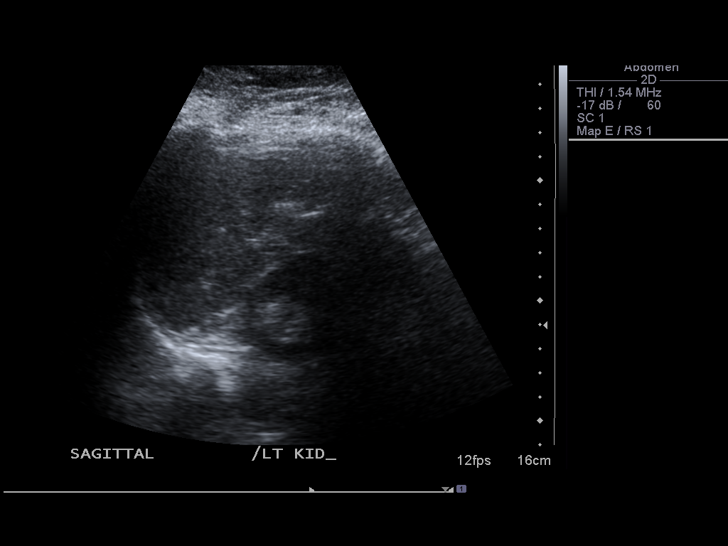

[14 of 25 positions shown; findings below may reference images not displayed]

FINDINGS: Gallbladder:  No gallstones, gallbladder wall thickening, or
pericholecystic fluid.

Common bile duct:  3.0 mm

Liver:  No focal lesion identified.  Within normal limits in
parenchymal echogenicity.

IVC:  Appears normal.

Pancreas:  No focal abnormality seen.

Spleen:  6.1 cm in length without focal abnormality

Right Kidney:  9.0 cm in length without significant abnormality

Left Kidney:  10.8 cm in length without significant abnormality.

Abdominal aorta:  No aneurysm identified.
IMPRESSION: Negative abdominal ultrasound.

## 2011-07-31 LAB — URINALYSIS, ROUTINE W REFLEX MICROSCOPIC
Glucose, UA: NEGATIVE
Ketones, ur: NEGATIVE
Leukocytes, UA: NEGATIVE
Nitrite: NEGATIVE
Protein, ur: NEGATIVE
pH: 6

## 2011-07-31 LAB — DIFFERENTIAL
Basophils Absolute: 0
Basophils Relative: 0
Eosinophils Absolute: 0.1
Eosinophils Relative: 1
Monocytes Absolute: 0.6

## 2011-07-31 LAB — POCT I-STAT, CHEM 8
Calcium, Ion: 1.21
HCT: 42
Hemoglobin: 14.3
Sodium: 141
TCO2: 28

## 2011-07-31 LAB — PREGNANCY, URINE: Preg Test, Ur: NEGATIVE

## 2011-07-31 LAB — CBC
HCT: 41.4
Hemoglobin: 13.7
MCHC: 33.1
Platelets: 305
RDW: 12.6

## 2011-07-31 LAB — URINE MICROSCOPIC-ADD ON

## 2011-10-15 ENCOUNTER — Other Ambulatory Visit: Payer: Self-pay

## 2011-10-15 ENCOUNTER — Encounter (HOSPITAL_COMMUNITY): Payer: Self-pay | Admitting: *Deleted

## 2011-10-15 ENCOUNTER — Emergency Department (HOSPITAL_COMMUNITY): Payer: BC Managed Care – PPO

## 2011-10-15 ENCOUNTER — Emergency Department (HOSPITAL_COMMUNITY)
Admission: EM | Admit: 2011-10-15 | Discharge: 2011-10-15 | Disposition: A | Discharge status: Home / Self Care | Payer: BC Managed Care – PPO | Attending: Emergency Medicine | Admitting: Emergency Medicine

## 2011-10-15 DIAGNOSIS — R42 Dizziness and giddiness: Secondary | ICD-10-CM | POA: Insufficient documentation

## 2011-10-15 DIAGNOSIS — R55 Syncope and collapse: Secondary | ICD-10-CM | POA: Insufficient documentation

## 2011-10-15 DIAGNOSIS — R079 Chest pain, unspecified: Secondary | ICD-10-CM | POA: Insufficient documentation

## 2011-10-15 DIAGNOSIS — M79609 Pain in unspecified limb: Secondary | ICD-10-CM | POA: Insufficient documentation

## 2011-10-15 DIAGNOSIS — R350 Frequency of micturition: Secondary | ICD-10-CM | POA: Insufficient documentation

## 2011-10-15 LAB — URINALYSIS, ROUTINE W REFLEX MICROSCOPIC
Ketones, ur: NEGATIVE mg/dL
Leukocytes, UA: NEGATIVE
Nitrite: NEGATIVE
Protein, ur: NEGATIVE mg/dL
pH: 6 (ref 5.0–8.0)

## 2011-10-15 LAB — DIFFERENTIAL
Basophils Absolute: 0 10*3/uL (ref 0.0–0.1)
Eosinophils Relative: 2 % (ref 0–5)
Lymphocytes Relative: 33 % (ref 12–46)
Neutro Abs: 5.3 10*3/uL (ref 1.7–7.7)
Neutrophils Relative %: 56 % (ref 43–77)

## 2011-10-15 LAB — POCT I-STAT, CHEM 8
BUN: 14 mg/dL (ref 6–23)
Calcium, Ion: 1.2 mmol/L (ref 1.12–1.32)
Chloride: 105 mEq/L (ref 96–112)
HCT: 65 % — ABNORMAL HIGH (ref 36.0–46.0)
Potassium: 3.5 mEq/L (ref 3.5–5.1)

## 2011-10-15 LAB — CBC
MCV: 85.1 fL (ref 78.0–100.0)
Platelets: 331 10*3/uL (ref 150–400)
RDW: 12.8 % (ref 11.5–15.5)
WBC: 9.4 10*3/uL (ref 4.0–10.5)

## 2011-10-15 LAB — D-DIMER, QUANTITATIVE: D-Dimer, Quant: 0.3 ug/mL-FEU (ref 0.00–0.48)

## 2011-10-15 MED ORDER — SODIUM CHLORIDE 0.9 % IV BOLUS (SEPSIS)
500.0000 mL | Freq: Once | INTRAVENOUS | Status: AC
  Administered 2011-10-15: 1000 mL via INTRAVENOUS

## 2011-10-15 NOTE — ED Notes (Signed)
MD at bedside (dr. Rubin Payor)

## 2011-10-15 NOTE — ED Notes (Signed)
[id number]<AV> Expected date:10/15/11<BR> Expected time: 2:06 PM<BR> Means of arrival:Ambulance<BR> Comments:<BR> EMS 31 GC - syncope/50 y/o f

## 2011-10-15 NOTE — ED Notes (Signed)
Standing up at workstation, felt lightheaded, vision became "dim"  Still feels lightheaded. New meds-flexiril & prednisone. Also c/o chest pressure en route- EKG unremarkable. VSS.

## 2011-10-15 NOTE — ED Provider Notes (Signed)
History     CSN: [id number] Arrival date & time: 10/15/2011  2:06 PM   First MD Initiated Contact with Patient 10/15/11 1532      Chief Complaint  Patient presents with  . Chest Pain  . Near Syncope    (Consider location/radiation/quality/duration/timing/severity/associated sxs/prior treatment) Patient is a 50 y.o. female presenting with chest pain. The history is provided by the patient.  Chest Pain The chest pain began 1 - 2 hours ago. Pertinent negatives for primary symptoms include no fatigue, no shortness of breath and no abdominal pain.  Pertinent negatives for associated symptoms include no diaphoresis.    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 Began to feel lightheaded her vision became dim. She's had some chest tightness also.that she's been having problems with her right leg recently. She states she hurt it pulling a cart at work. She's been on a Medrol Dosepak lidocaine patch and Flexeril. She's not taken Flexeril in a week because it made her sleepy. She does not have a cardiac history that she knows of. She states she's had some urinary urgency 2. No fevers. No cough. No dyspnea. No nausea vomiting or diarrhea. She does not smoke.  Past Medical History  Diagnosis Date  . Personal history of colonic polyps 2008    hyperplastic  . Diverticulosis of colon (without mention of hemorrhage)   . Unspecified gastritis and gastroduodenitis without mention of hemorrhage   . Reflux esophagitis   . Hiatal hernia   . Allergy, unspecified not elsewhere classified   . Depressive disorder, not elsewhere classified   . Panic disorder without agoraphobia   . GERD (gastroesophageal reflux disease)     Past Surgical History  Procedure Date  . Foot surgery     bilateral    Family History  Problem Relation Age of Onset  . Kidney disease Father   . Heart disease Mother   . Heart disease Father   . Colon cancer Neg Hx     History  Substance Use Topics  . Smoking  status: Former Games developer  . Smokeless tobacco: Never Used  . Alcohol Use: No    OB History    Grav Para Term Preterm Abortions TAB SAB Ect Mult Living                  Review of Systems  Constitutional: Negative for diaphoresis and fatigue.  HENT: Negative for neck pain.   Respiratory: Negative for shortness of breath.   Cardiovascular: Positive for chest pain.  Gastrointestinal: Negative for abdominal pain.  Genitourinary: Positive for frequency.  Musculoskeletal: Positive for back pain. Negative for joint swelling.  Skin: Negative for pallor.  Neurological: Positive for light-headedness.  Psychiatric/Behavioral: Negative for confusion.    Allergies  Review of patient's allergies indicates no known allergies.  Home Medications   Current Outpatient Rx  Name Route Sig Dispense Refill  . CYCLOBENZAPRINE HCL 10 MG PO TABS Oral Take 10 mg by mouth at bedtime as needed. For muscle spasms/pain.    . IBUPROFEN 200 MG PO TABS Oral Take 200 mg by mouth. As needed for arthritis pain     . LIDOCAINE 5 % EX PTCH Transdermal Place 1 patch onto the skin every 12 (twelve) hours as needed. Remove & Discard patch within 12 hours or as directed by MD     . METHYLPREDNISOLONE 4 MG PO KIT Oral Take 4 mg by mouth daily. Take the following number of tablets on consecutive days:6-5-4-3-2-1. Follow package directions. Patient  filled on 10/11/2011 never started therapy course.       BP 131/71  Pulse 73  Temp(Src) 98.2 F (36.8 C) (Oral)  Resp 22  SpO2 95%  Physical Exam  Nursing note and vitals reviewed. Constitutional: She is oriented to person, place, and time. She appears well-developed and well-nourished.  HENT:  Head: Normocephalic and atraumatic.  Eyes: EOM are normal. Pupils are equal, round, and reactive to light.  Neck: Normal range of motion. Neck supple.  Cardiovascular: Normal rate, regular rhythm and normal heart sounds.   No murmur heard. Pulmonary/Chest: Effort normal and  breath sounds normal. No respiratory distress. She has no wheezes. She has no rales.  Abdominal: Soft. Bowel sounds are normal. She exhibits no distension. There is no tenderness. There is no rebound and no guarding.  Musculoskeletal: Normal range of motion.       Tender to right thigh. No peripheral edema.  dorsalis pedis pulse intact distally  Neurological: She is alert and oriented to person, place, and time. No cranial nerve deficit.  Skin: Skin is warm and dry.  Psychiatric: She has a normal mood and affect. Her speech is normal.    ED Course  Procedures (including critical care time)  Labs Reviewed  POCT I-STAT, CHEM 8 - Abnormal; Notable for the following:    Hemoglobin 22.1 (*)    HCT 65.0 (*)    All other components within normal limits  CBC  DIFFERENTIAL  URINALYSIS, ROUTINE W REFLEX MICROSCOPIC  TROPONIN I  D-DIMER, QUANTITATIVE  I-STAT, CHEM 8   Dg Chest 2 View  10/15/2011  *RADIOLOGY REPORT*  Clinical Data: Chest pain and cough.  CHEST - 2 VIEW  Comparison: 04/02/2011  Findings: Heart size and vascularity are normal and the lungs are clear.  Slight peribronchial thickening suggestive of bronchitis. No effusions.  No osseous abnormalities.  IMPRESSION: Mild bronchitic changes.  Original Report Authenticated By: Gwynn Burly, M.D.     1. Near syncope   2. CHEST PAIN      Date: 10/15/2011  Rate: 76  Rhythm: normal sinus rhythm  QRS Axis: normal  Intervals: normal  ST/T Wave abnormalities: nonspecific T wave changes  Conduction Disutrbances:none  Narrative Interpretation:   Old EKG Reviewed: unchanged    MDM  Near syncope and chest pain. She's been on new medications for her leg pain. Laboratories reassuring. She is not orthostatic. Her EKG did not show ischemic changes. She has medications for leg at home. She'll be discharged home. She'll follow upwith her primary care Dr.        Juliet Rude. Rubin Payor, MD 10/15/11 1740

## 2012-04-26 ENCOUNTER — Emergency Department (HOSPITAL_COMMUNITY)
Admission: EM | Admit: 2012-04-26 | Discharge: 2012-04-26 | Disposition: A | Discharge status: Home / Self Care | Payer: BC Managed Care – PPO | Attending: Emergency Medicine | Admitting: Emergency Medicine

## 2012-04-26 DIAGNOSIS — F3289 Other specified depressive episodes: Secondary | ICD-10-CM | POA: Insufficient documentation

## 2012-04-26 DIAGNOSIS — F329 Major depressive disorder, single episode, unspecified: Secondary | ICD-10-CM | POA: Insufficient documentation

## 2012-04-26 DIAGNOSIS — K219 Gastro-esophageal reflux disease without esophagitis: Secondary | ICD-10-CM | POA: Insufficient documentation

## 2012-04-26 DIAGNOSIS — IMO0001 Reserved for inherently not codable concepts without codable children: Secondary | ICD-10-CM | POA: Insufficient documentation

## 2012-04-26 DIAGNOSIS — M791 Myalgia, unspecified site: Secondary | ICD-10-CM

## 2012-04-26 DIAGNOSIS — M549 Dorsalgia, unspecified: Secondary | ICD-10-CM | POA: Insufficient documentation

## 2012-04-26 LAB — URINALYSIS, ROUTINE W REFLEX MICROSCOPIC
Bilirubin Urine: NEGATIVE
Leukocytes, UA: NEGATIVE
Nitrite: NEGATIVE
Specific Gravity, Urine: 1.027 (ref 1.005–1.030)
Urobilinogen, UA: 1 mg/dL (ref 0.0–1.0)
pH: 5.5 (ref 5.0–8.0)

## 2012-04-26 LAB — CBC WITH DIFFERENTIAL/PLATELET
Eosinophils Absolute: 0.3 10*3/uL (ref 0.0–0.7)
Hemoglobin: 13.5 g/dL (ref 12.0–15.0)
Lymphocytes Relative: 27 % (ref 12–46)
Lymphs Abs: 2.8 10*3/uL (ref 0.7–4.0)
MCH: 28.2 pg (ref 26.0–34.0)
Monocytes Relative: 7 % (ref 3–12)
Neutro Abs: 6.7 10*3/uL (ref 1.7–7.7)
Neutrophils Relative %: 63 % (ref 43–77)
Platelets: 297 10*3/uL (ref 150–400)
RBC: 4.79 MIL/uL (ref 3.87–5.11)
WBC: 10.6 10*3/uL — ABNORMAL HIGH (ref 4.0–10.5)

## 2012-04-26 LAB — BASIC METABOLIC PANEL
BUN: 17 mg/dL (ref 6–23)
CO2: 24 mEq/L (ref 19–32)
Chloride: 104 mEq/L (ref 96–112)
GFR calc non Af Amer: 83 mL/min — ABNORMAL LOW (ref >90)
Glucose, Bld: 95 mg/dL (ref 70–99)
Potassium: 4.8 mEq/L (ref 3.5–5.1)
Sodium: 137 mEq/L (ref 135–145)

## 2012-04-26 MED ORDER — HYDROCODONE-ACETAMINOPHEN 5-325 MG PO TABS: 1.0000 | ORAL_TABLET | Freq: Four times a day (QID) | ORAL | Status: AC | PRN

## 2012-04-26 MED ORDER — HYDROMORPHONE HCL PF 1 MG/ML IJ SOLN
1.0000 mg | Freq: Once | INTRAMUSCULAR | Status: DC
  Filled 2012-04-26: qty 1

## 2012-04-26 MED ORDER — HYDROCODONE-ACETAMINOPHEN 5-325 MG PO TABS: 1.0000 | ORAL_TABLET | Freq: Four times a day (QID) | ORAL | Status: DC | PRN

## 2012-04-26 MED ORDER — OXYCODONE-ACETAMINOPHEN 5-325 MG PO TABS
1.0000 | ORAL_TABLET | Freq: Once | ORAL | Status: AC
Start: 2012-04-26 — End: 2012-04-26
  Administered 2012-04-26: 1 via ORAL
  Filled 2012-04-26: qty 1

## 2012-04-26 MED ORDER — DIAZEPAM 5 MG PO TABS: 5.0000 mg | ORAL_TABLET | Freq: Two times a day (BID) | ORAL | Status: AC | PRN

## 2012-04-26 MED ORDER — DIAZEPAM 5 MG PO TABS: 5.0000 mg | ORAL_TABLET | Freq: Two times a day (BID) | ORAL | Status: DC | PRN

## 2012-04-26 MED ORDER — ONDANSETRON HCL 4 MG/2ML IJ SOLN
4.0000 mg | Freq: Once | INTRAMUSCULAR | Status: DC
  Filled 2012-04-26: qty 2

## 2012-04-26 NOTE — ED Provider Notes (Signed)
Medical screening examination/treatment/procedure(s) were conducted as a shared visit with non-physician practitioner(s) and myself.  I personally evaluated the patient during the encounter  Denika Krone, MD 04/26/12 1254 

## 2012-04-26 NOTE — ED Notes (Signed)
Patient is alert and oriented x3.  She is complaining of left sided flank pain that started on Friday. Currently she rates her pain at a 7 of 10 constant pain with no relief in any position.  She states she has no history of this issue before.  She denies nausea and vomiting.

## 2012-04-26 NOTE — Discharge Instructions (Signed)
Read the information below.  Please avoid eating or drinking all dairy products including milk, yogurt, cheese, and ice cream.  Dr Ferd Hibbs office will call you for a follow up appointment.  Return to the ER immediately if you develop fevers, uncontrolled pain, weakness or numbness of your legs, loss of control of bowel or bladder, or inability to walk.  You may return to the ER at any time for worsening condition or any new symptoms that concern you.   Back Pain, Adult Back pain is very common. The pain often gets better over time. The cause of back pain is usually not dangerous. Most people can learn to manage their back pain on their own.  HOME CARE   Stay active. Start with short walks on flat ground if you can. Try to walk farther each day.   Do not sit, drive, or stand in one place for more than 30 minutes. Do not stay in bed.   Do not avoid exercise or work. Activity can help your back heal faster.   Be careful when you bend or lift an object. Bend at your knees, keep the object close to you, and do not twist.   Sleep on a firm mattress. Lie on your side, and bend your knees. If you lie on your back, put a pillow under your knees.   Only take medicines as told by your doctor.   Put ice on the injured area.   Put ice in a plastic bag.   Place a towel between your skin and the bag.   Leave the ice on for 15 to 20 minutes, 3 to 4 times a day for the first 2 to 3 days. After that, you can switch between ice and heat packs.   Ask your doctor about back exercises or massage.   Avoid feeling anxious or stressed. Find good ways to deal with stress, such as exercise.  GET HELP RIGHT AWAY IF:   Your pain does not go away with rest or medicine.   Your pain does not go away in 1 week.   You have new problems.   You do not feel well.   The pain spreads into your legs.   You cannot control when you poop (bowel movement) or pee (urinate).   Your arms or legs feel weak or lose  feeling (numbness).   You feel sick to your stomach (nauseous) or throw up (vomit).   You have belly (abdominal) pain.   You feel like you may pass out (faint).  MAKE SURE YOU:   Understand these instructions.   Will watch your condition.   Will get help right away if you are not doing well or get worse.  Document Released: 04/01/2008 Document Revised: 10/03/2011 Document Reviewed: 03/04/2011 [facility] Patient Information 2012 [place], [place].Back Pain, Adult Back pain is very common. The pain often gets better over time. The cause of back pain is usually not dangerous. Most people can learn to manage their back pain on their own.  HOME CARE   Stay active. Start with short walks on flat ground if you can. Try to walk farther each day.   Do not sit, drive, or stand in one place for more than 30 minutes. Do not stay in bed.   Do not avoid exercise or work. Activity can help your back heal faster.   Be careful when you bend or lift an object. Bend at your knees, keep the object close to you, and do not twist.  Sleep on a firm mattress. Lie on your side, and bend your knees. If you lie on your back, put a pillow under your knees.   Only take medicines as told by your doctor.   Put ice on the injured area.   Put ice in a plastic bag.   Place a towel between your skin and the bag.   Leave the ice on for 15 to 20 minutes, 3 to 4 times a day for the first 2 to 3 days. After that, you can switch between ice and heat packs.   Ask your doctor about back exercises or massage.   Avoid feeling anxious or stressed. Find good ways to deal with stress, such as exercise.  GET HELP RIGHT AWAY IF:   Your pain does not go away with rest or medicine.   Your pain does not go away in 1 week.   You have new problems.   You do not feel well.   The pain spreads into your legs.   You cannot control when you poop (bowel movement) or pee (urinate).   Your arms or legs feel weak or lose  feeling (numbness).   You feel sick to your stomach (nauseous) or throw up (vomit).   You have belly (abdominal) pain.   You feel like you may pass out (faint).  MAKE SURE YOU:   Understand these instructions.   Will watch your condition.   Will get help right away if you are not doing well or get worse.  Document Released: 04/01/2008 Document Revised: 10/03/2011 Document Reviewed: 03/04/2011 [facility] Patient Information 2012 [place], [place].

## 2012-04-26 NOTE — ED Notes (Signed)
Back pain, left flank area, started Friday evening suddenly, no dysuria,

## 2012-04-26 NOTE — ED Provider Notes (Signed)
History     CSN: [id number]  Arrival date & time 04/26/12  0725   First MD Initiated Contact with Patient 04/26/12 [phone]      Chief Complaint  Patient presents with  . Back Pain    left side flank pain    (Consider location/radiation/quality/duration/timing/severity/associated sxs/prior treatment) HPI Comments: Patient reports left flank pain that began two days ago, worse with movement and with laying flat.  Pain is constant, also wakes her from sleep.  Has been taking ibuprofen with some relief.  Pt also reports chronic diarrhea, urinary frequency and urgency x 2-3 months.  States she occasionally does not get to the bathroom.  Pt reports low back injury on the job that occurred 6 months ago.  Denies fevers, abdominal pain, N/V, leg pain, weakness or numbness of the legs.   The history is provided by the patient.    Past Medical History  Diagnosis Date  . Personal history of colonic polyps 2008    hyperplastic  . Diverticulosis of colon (without mention of hemorrhage)   . Unspecified gastritis and gastroduodenitis without mention of hemorrhage   . Reflux esophagitis   . Hiatal hernia   . Allergy, unspecified not elsewhere classified   . Depressive disorder, not elsewhere classified   . Panic disorder without agoraphobia   . GERD (gastroesophageal reflux disease)     Past Surgical History  Procedure Date  . Foot surgery     bilateral    Family History  Problem Relation Age of Onset  . Kidney disease Father   . Heart disease Mother   . Heart disease Father   . Colon cancer Neg Hx     History  Substance Use Topics  . Smoking status: Former Games developer  . Smokeless tobacco: Never Used  . Alcohol Use: No    OB History    Grav Para Term Preterm Abortions TAB SAB Ect Mult Living                  Review of Systems  Constitutional: Negative for fever, chills, diaphoresis and unexpected weight change.  Respiratory: Negative for cough and shortness of breath.     Cardiovascular: Negative for chest pain.  Gastrointestinal: Positive for diarrhea. Negative for nausea, vomiting and abdominal pain.  Genitourinary: Positive for urgency and frequency. Negative for dysuria and menstrual problem.  Musculoskeletal: Positive for back pain.  Neurological: Negative for dizziness, syncope, weakness, light-headedness and numbness.    Allergies  Review of patient's allergies indicates no known allergies.  Home Medications   Current Outpatient Rx  Name Route Sig Dispense Refill  . IBUPROFEN 200 MG PO TABS Oral Take 200 mg by mouth every 6 (six) hours as needed. For pain      BP 115/79  Pulse 84  Temp 97.9 F (36.6 C) (Oral)  Resp 18  SpO2 96%  Physical Exam  Nursing note and vitals reviewed. Constitutional: She appears well-developed and well-nourished. No distress.  HENT:  Head: Normocephalic and atraumatic.  Neck: Neck supple.  Cardiovascular: Normal rate, regular rhythm and normal heart sounds.   Pulmonary/Chest: Breath sounds normal. No respiratory distress. She has no wheezes. She has no rales. She exhibits no tenderness.  Abdominal: Soft. Bowel sounds are normal. She exhibits no distension and no mass. There is no rebound and no guarding.  Musculoskeletal:       Arms:      Spine without crepitus, no step-offs.  No skin changes.  Lower extremities: strength 5/5, sensation intact,  distal pulses intact.   Neurological: She is alert.  Skin: She is not diaphoretic.    ED Course  Procedures (including critical care time)  Labs Reviewed  URINALYSIS, ROUTINE W REFLEX MICROSCOPIC - Abnormal; Notable for the following:    APPearance CLOUDY (*)     All other components within normal limits  CBC WITH DIFFERENTIAL - Abnormal; Notable for the following:    WBC 10.6 (*)     All other components within normal limits  BASIC METABOLIC PANEL - Abnormal; Notable for the following:    GFR calc non Af Amer 83 (*)     All other components within normal  limits  URINE CULTURE   No results found.  8:19 AM Discussed patient with Dr Ethelda Chick who has also seen the patient.  Per our discussion, will treat as musculoskeletal back pain and will call PCP for close follow up.    8:39 AM Spoke with Dr Timothy Lasso regarding patient.  He asked me to have her avoid dairy products and his office will give her a call shortly for follow up.  I have discussed this with the patient.    9:31 AM Patient reports she is feeling much better after pain medication, is able to twist and move without pain.  No needs at this time.    1. Back pain   2. Muscular pain       MDM  Afebrile nontoxic patient with left back pain, worse with movement.  Pt has possibly associated low back injury from 6 months ago.  No radicular symptoms, no bowel or bladder incontinence.  Doubt cauda equina.  No red flags for back pain.  Pt also has chronic (2-3 months) of diarrhea and urinary frequency and urgency - I have spoken with Dr Timothy Lasso (pt's PCP) who will follow closely with her.  Urine sent for culture.  Pt feeling better after one percocet.  Pt d/c home with valium, norco, PCP follow up.  Return precautions given.  Patient verbalizes understanding and agrees with plan.          Dillard Cannon [place], [place] 04/26/12 1211

## 2012-04-26 NOTE — ED Notes (Signed)
MD in room

## 2012-04-26 NOTE — ED Provider Notes (Signed)
Complains of left-sided back pain nonradiating onset January 2013 as result of the work-related injury pain became worse 2 days ago treated herself with Motrin without adequate pain relief. No new injury no fever pain is worse with movement. She reports diarrhea for 2 months sometimes reporting that she is unable to make it to the bathroom before having diarrhea. She denies urinary incontinence  No focal weakness. No abdominal pain. No other associated symptoms. On exam alert nontoxic lungs clear to auscultation heart regular rate and rhythm abdomen obese nontender back left-sided paralumbar tenderness. Rectum normal tone brown stool. Neurologic Glasgow Coma Score 15 motor strength 5 over 5 overall DTRs symmetric bilaterally at knee jerk ankle jerk and biceps toes 4 to bilaterally gait normal. Medical decision making pain likely musculoskeletal in etiology. Strongly doubt cauda equina syndrome . Normal rectal tone normal motor strength no urinary incontinence.  Doug Sou, MD 04/26/12 [phone]

## 2013-02-25 DIAGNOSIS — R2231 Localized swelling, mass and lump, right upper limb: Secondary | ICD-10-CM

## 2013-02-25 HISTORY — DX: Localized swelling, mass and lump, right upper limb: R22.31

## 2013-03-08 ENCOUNTER — Other Ambulatory Visit: Payer: Self-pay | Admitting: Orthopedic Surgery

## 2013-03-16 ENCOUNTER — Encounter (HOSPITAL_BASED_OUTPATIENT_CLINIC_OR_DEPARTMENT_OTHER): Payer: Self-pay | Admitting: *Deleted

## 2013-03-16 DIAGNOSIS — R0981 Nasal congestion: Secondary | ICD-10-CM

## 2013-03-16 HISTORY — DX: Nasal congestion: R09.81

## 2013-03-24 ENCOUNTER — Ambulatory Visit (HOSPITAL_BASED_OUTPATIENT_CLINIC_OR_DEPARTMENT_OTHER)
Admission: RE | Admit: 2013-03-24 | Payer: BC Managed Care – PPO | Source: Ambulatory Visit | Admitting: Orthopedic Surgery

## 2013-03-24 HISTORY — DX: Localized swelling, mass and lump, right upper limb: R22.31

## 2013-03-24 HISTORY — DX: Presence of dental prosthetic device (complete) (partial): Z97.2

## 2013-03-24 HISTORY — DX: Nasal congestion: R09.81

## 2013-03-24 HISTORY — DX: Other seasonal allergic rhinitis: J30.2

## 2013-03-24 HISTORY — DX: Unspecified osteoarthritis, unspecified site: M19.90

## 2013-03-24 SURGERY — EXCISION METACARPAL MASS
Anesthesia: Regional | Site: Hand | Laterality: Right

## 2013-06-21 ENCOUNTER — Encounter (HOSPITAL_COMMUNITY): Payer: Self-pay | Admitting: Emergency Medicine

## 2013-06-21 ENCOUNTER — Emergency Department (INDEPENDENT_AMBULATORY_CARE_PROVIDER_SITE_OTHER): Payer: BC Managed Care – PPO

## 2013-06-21 ENCOUNTER — Emergency Department (HOSPITAL_COMMUNITY)
Admission: EM | Admit: 2013-06-21 | Discharge: 2013-06-21 | Disposition: A | Discharge status: Home / Self Care | Payer: BC Managed Care – PPO | Source: Home / Self Care | Attending: Emergency Medicine | Admitting: Emergency Medicine

## 2013-06-21 DIAGNOSIS — J40 Bronchitis, not specified as acute or chronic: Secondary | ICD-10-CM

## 2013-06-21 MED ORDER — BENZONATATE 100 MG PO CAPS: 100.0000 mg | ORAL_CAPSULE | Freq: Three times a day (TID) | ORAL | Status: DC

## 2013-06-21 MED ORDER — PREDNISONE 10 MG PO TABS: 50.0000 mg | ORAL_TABLET | Freq: Every day | ORAL | Status: DC

## 2013-06-21 NOTE — ED Notes (Signed)
C/o nonproductive cough x 8 days with chest soreness. Sinus pressure and pain.  Denies fever, n/v/d Pt has tried otc sinus cold meds with no relief in symptoms.

## 2013-06-21 NOTE — ED Provider Notes (Signed)
CSN: [id number]     Arrival date & time 06/21/13  1638 History   None    Chief Complaint  Patient presents with  . Cough    nonproductive x 8 days.    (Consider location/radiation/quality/duration/timing/severity/associated sxs/prior Treatment) Patient is a 52 y.o. female presenting with cough. The history is provided by the patient.  Cough Cough characteristics:  Non-productive and barking Severity:  Moderate Onset quality:  Gradual Duration:  8 days Timing:  Intermittent Progression:  Unchanged Chronicity:  New Smoker: no   Relieved by:  Nothing Worsened by:  Lying down Ineffective treatments: mucinex. Associated symptoms: chills, ear fullness, fever and sinus congestion   Associated symptoms: no ear pain, no rhinorrhea, no shortness of breath, no sore throat and no wheezing     Past Medical History  Diagnosis Date  . Hiatal hernia   . GERD (gastroesophageal reflux disease)   . Seasonal allergies   . Arthritis     right hand  . Mass of finger of right hand 02/2013    right middle finger  . Sinus congestion 03/16/2013    cough, stuffy and runny nose  . Wears partial dentures     upper   Past Surgical History  Procedure Laterality Date  . Foot surgery Bilateral     exc. ingrown toenail great toe  . Tubal ligation  09/02/2002   Family History  Problem Relation Age of Onset  . Kidney disease Father   . Heart disease Mother   . Heart disease Father   . Colon cancer Neg Hx    History  Substance Use Topics  . Smoking status: Former Games developer  . Smokeless tobacco: Never Used     Comment: quit smoking 20 years ago  . Alcohol Use: No   OB History   Grav Para Term Preterm Abortions TAB SAB Ect Mult Living                 Review of Systems  Constitutional: Positive for fever and chills.  HENT: Positive for congestion. Negative for ear pain, sore throat, rhinorrhea and sinus pressure.   Respiratory: Positive for cough. Negative for shortness of breath and wheezing.      Allergies  Shellfish allergy  Home Medications   Current Outpatient Rx  Name  Route  Sig  Dispense  Refill  . benzonatate (TESSALON) 100 MG capsule   Oral   Take 1 capsule (100 mg total) by mouth every 8 (eight) hours.   21 capsule   0   . predniSONE (DELTASONE) 10 MG tablet   Oral   Take 5 tablets (50 mg total) by mouth daily.   15 tablet   0    BP 141/94  Pulse 85  Temp(Src) 98.8 F (37.1 C) (Oral)  Resp 16  SpO2 97% Physical Exam  Constitutional: She appears well-developed and well-nourished. No distress.  HENT:  Right Ear: External ear and ear canal normal. A middle ear effusion is present.  Left Ear: Tympanic membrane, external ear and ear canal normal.  Nose: Mucosal edema present. Right sinus exhibits no maxillary sinus tenderness and no frontal sinus tenderness. Left sinus exhibits no maxillary sinus tenderness and no frontal sinus tenderness.  Mouth/Throat: Oropharynx is clear and moist and mucous membranes are normal.  Cardiovascular: Normal rate and regular rhythm.   Pulmonary/Chest: Effort normal and breath sounds normal.  Frequent dry cough    ED Course  Procedures (including critical care time) Labs Review Labs Reviewed - No data to  display Imaging Review Dg Chest 2 View  06/21/2013   CLINICAL DATA:  Fever, chills. Nonproductive cough.  EXAM: CHEST  2 VIEW  COMPARISON:  10/15/2011  FINDINGS: The heart size and mediastinal contours are within normal limits. Both lungs are clear. The visualized skeletal structures are unremarkable.  IMPRESSION: No active cardiopulmonary disease.   Electronically Signed   By: Charlett Nose   On: 06/21/2013 17:54    MDM   1. Bronchitis   rx prednisone 50mg  daily for 3 days, and tessalon perles 100mg  TID prn cough #21. Use saline nasal spray and mucinex. Return to work 8/27.      Cathlyn Parsons, NP 06/21/13 [phone]

## 2013-06-21 NOTE — ED Provider Notes (Signed)
Medical screening examination/treatment/procedure(s) were performed by non-physician practitioner and as supervising physician I was immediately available for consultation/collaboration.  Marie Torres   Maritssa Haughton, MD 06/21/13 1809 

## 2013-09-17 ENCOUNTER — Other Ambulatory Visit: Payer: Self-pay | Admitting: Orthopedic Surgery

## 2013-09-21 ENCOUNTER — Encounter (HOSPITAL_BASED_OUTPATIENT_CLINIC_OR_DEPARTMENT_OTHER): Payer: Self-pay | Admitting: Anesthesiology

## 2013-09-21 ENCOUNTER — Ambulatory Visit (HOSPITAL_BASED_OUTPATIENT_CLINIC_OR_DEPARTMENT_OTHER)
Admission: RE | Admit: 2013-09-21 | Payer: BC Managed Care – PPO | Source: Ambulatory Visit | Admitting: Orthopedic Surgery

## 2013-09-21 ENCOUNTER — Other Ambulatory Visit: Payer: Self-pay | Admitting: Orthopedic Surgery

## 2013-09-21 ENCOUNTER — Encounter (HOSPITAL_BASED_OUTPATIENT_CLINIC_OR_DEPARTMENT_OTHER): Admission: RE | Payer: Self-pay | Source: Ambulatory Visit

## 2013-09-21 ENCOUNTER — Encounter (HOSPITAL_BASED_OUTPATIENT_CLINIC_OR_DEPARTMENT_OTHER): Payer: Self-pay | Admitting: Orthopedic Surgery

## 2013-09-21 SURGERY — REPAIR, TENDON, EXTENSOR
Anesthesia: Choice | Laterality: Right

## 2013-09-27 ENCOUNTER — Other Ambulatory Visit: Payer: Self-pay | Admitting: Orthopedic Surgery

## 2013-09-28 ENCOUNTER — Ambulatory Visit (HOSPITAL_BASED_OUTPATIENT_CLINIC_OR_DEPARTMENT_OTHER)
Admission: RE | Admit: 2013-09-28 | Payer: BC Managed Care – PPO | Source: Ambulatory Visit | Admitting: Orthopedic Surgery

## 2013-09-28 ENCOUNTER — Encounter (HOSPITAL_BASED_OUTPATIENT_CLINIC_OR_DEPARTMENT_OTHER): Admission: RE | Payer: Self-pay | Source: Ambulatory Visit

## 2013-09-28 SURGERY — EXPLORATION, TENDON
Anesthesia: Choice | Site: Finger | Laterality: Right

## 2013-11-22 ENCOUNTER — Encounter: Payer: Self-pay | Admitting: Physician Assistant

## 2013-11-22 ENCOUNTER — Ambulatory Visit (INDEPENDENT_AMBULATORY_CARE_PROVIDER_SITE_OTHER): Payer: BC Managed Care – PPO | Admitting: Physician Assistant

## 2013-11-22 VITALS — BP 130/70 | HR 80 | Temp 97.3°F | Resp 20 | Ht 61.0 in | Wt 204.0 lb

## 2013-11-22 DIAGNOSIS — Z0000 Encounter for general adult medical examination without abnormal findings: Secondary | ICD-10-CM

## 2013-11-22 DIAGNOSIS — IMO0001 Reserved for inherently not codable concepts without codable children: Secondary | ICD-10-CM

## 2013-11-22 DIAGNOSIS — K219 Gastro-esophageal reflux disease without esophagitis: Secondary | ICD-10-CM

## 2013-11-22 DIAGNOSIS — R079 Chest pain, unspecified: Secondary | ICD-10-CM

## 2013-11-22 MED ORDER — DEXLANSOPRAZOLE 30 MG PO CPDR: 30.0000 mg | DELAYED_RELEASE_CAPSULE | Freq: Every day | ORAL | Status: DC

## 2013-11-22 NOTE — Progress Notes (Signed)
Subjective:    Patient ID: Marie Torres, female    DOB: 12/27/1960, 53 y.o.   MRN: [id number]  HPI Comments: Patient is a 53 year old female who presents to the clinic to establish care. Has had regularly scheduled PCP appointment within last 6 months.  Patient self-reports a history of reflux which is well controlled with Dexilant. Denies history of other chronic conditions or medications. Patient states in the last 2 weeks she has had 2 episodes of sharp chest pain on the left side of her chest stating pain is only lasted a few seconds and is not associated with any particular activity. Denies shortness of breath, cough, upper or lower extremity swelling. Denies headache, diaphoresis, nausea, vomiting, change in vision or visual disturbances, change in bowel or bladder habits.    Review of Systems  Constitutional: Negative for fever, chills and appetite change.  Eyes: Negative for pain and visual disturbance.  Respiratory: Negative for cough, shortness of breath and wheezing.   Cardiovascular: Positive for chest pain (2 separate episodes in last 3 weeks, lasting only seconds).  Gastrointestinal: Negative for vomiting and blood in stool.  Skin: Negative for rash.  Neurological: Negative for dizziness, weakness, light-headedness, numbness and headaches.  All other systems reviewed and are negative.      Objective:   Physical Exam  Vitals reviewed. Constitutional: She is oriented to person, place, and time. Vital signs are normal. She appears well-developed and well-nourished. No distress.  HENT:  Head: Normocephalic and atraumatic.  Right Ear: Hearing, tympanic membrane, external ear and ear canal normal.  Left Ear: Hearing, tympanic membrane, external ear and ear canal normal.  Nose: Nose normal.  Mouth/Throat: Uvula is midline, oropharynx is clear and moist and mucous membranes are normal.  Eyes: Conjunctivae and EOM are normal. Pupils are equal, round, and reactive to light.  Neck:  Normal range of motion.  Cardiovascular: Normal rate, regular rhythm, S1 normal and S2 normal.   Pulses:      Radial pulses are 2+ on the right side, and 2+ on the left side.       Posterior tibial pulses are 2+ on the right side, and 2+ on the left side.  Pulmonary/Chest: Effort normal. She has no decreased breath sounds. She has no wheezes. She has no rhonchi. She has no rales. She exhibits no tenderness.  Chest pain not reproducible with palpation  Abdominal: Soft. Normal appearance and bowel sounds are normal. There is no tenderness.  Genitourinary:  Deferred to GYN  Musculoskeletal:  Full range of motion of upper and lower extremity bilateral  Lymphadenopathy:    She has no cervical adenopathy.       Right: No supraclavicular adenopathy present.       Left: No supraclavicular adenopathy present.  Neurological: She is alert and oriented to person, place, and time. She has normal strength. No sensory deficit. She displays a negative Romberg sign. Coordination and gait normal. GCS eye subscore is 4. GCS verbal subscore is 5. GCS motor subscore is 6.  Skin: Skin is warm and dry.       Past Medical History  Diagnosis Date  . Hiatal hernia   . GERD (gastroesophageal reflux disease)   . Seasonal allergies   . Arthritis     right hand  . Mass of finger of right hand 02/2013    right middle finger  . Sinus congestion 03/16/2013    cough, stuffy and runny nose  . Wears partial dentures     upper  .  Heart murmur   . Urine incontinence   . Chicken pox     Childhood   Family History  Problem Relation Age of Onset  . Kidney disease Father   . Heart disease Father   . Stroke Father   . Hypertension Father   . Heart disease Mother   . Depression Mother   . Colon cancer Neg Hx    History   Social History  . Marital Status: Single    Spouse Name: N/A    Number of Children: N/A  . Years of Education: N/A   Social History Main Topics  . Smoking status: Former Research scientist (life sciences)  .  Smokeless tobacco: Never Used     Comment: quit smoking 20 years ago  . Alcohol Use: No  . Drug Use: No  . Sexual Activity: Yes   Other Topics Concern  . None   Social History Narrative  . None   Past Surgical History  Procedure Laterality Date  . Foot surgery Bilateral     exc. ingrown toenail great toe  . Tubal ligation  09/02/2002   Lab Results  Component Value Date   WBC 10.6* 04/26/2012   HGB 13.5 04/26/2012   HCT 40.5 04/26/2012   PLT 297 04/26/2012   GLUCOSE 95 04/26/2012   ALT 11 04/02/2011   AST 16 04/02/2011   NA 137 04/26/2012   K 4.8 04/26/2012   CL 104 04/26/2012   CREATININE 0.80 04/26/2012   BUN 17 04/26/2012   CO2 24 04/26/2012    EKG reviewed: Normal sinus rhythm with nonspecific T. abnormality. Rate 69. No acute changes. Assessment & Plan:    CPX/v70.0 - Patient has been counseled on age-appropriate routine health concerns for screening and prevention. These are reviewed and up-to-date. Immunizations are up-to-date or declined. Labs ordered and ECG reviewed.   Chest pain: No acute changes on EKG, symptoms possibly related to reflux.  Reflux: Discussed lifestyle changes to reduce symptoms. E-Rx for Dexilant 30 mg one tab daily.

## 2013-11-22 NOTE — Progress Notes (Signed)
Pre-visit discussion using our clinic review tool. No additional management support is needed unless otherwise documented below in the visit note.  

## 2013-11-22 NOTE — Patient Instructions (Addendum)
It was great meeting you today Marie Torres! Labs have been ordered for you, when you report to lab please be fasting.   I have refilled prescription of Dexilant to your preferred pharmacy.   I have made referral for GYN.    Chest Pain (Nonspecific) Chest pain has many causes. Your pain could be caused by something serious, such as a heart attack or a blood clot in the lungs. It could also be caused by something less serious, such as a chest bruise or a virus. Follow up with your doctor. More lab tests or other studies may be needed to find the cause of your pain. Most of the time, nonspecific chest pain will improve within 2 to 3 days of rest and mild pain medicine. HOME CARE  For chest bruises, you may put ice on the sore area for 15-20 minutes, 03-04 times a day. Do this only if it makes you feel better.  Put ice in a plastic bag.  Place a towel between the skin and the bag.  Rest for the next 2 to 3 days.  Go back to work if the pain improves.  See your doctor if the pain lasts longer than 1 to 2 weeks.  Only take medicine as told by your doctor.  Quit smoking if you smoke. GET HELP RIGHT AWAY IF:   There is more pain or pain that spreads to the arm, neck, jaw, back, or belly (abdomen).  You have shortness of breath.  You cough more than usual or cough up blood.  You have very bad back or belly pain, feel sick to your stomach (nauseous), or throw up (vomit).  You have very bad weakness.  You pass out (faint).  You have a fever. Any of these problems may be serious and may be an emergency. Do not wait to see if the problems will go away. Get medical help right away. Call your local emergency services 911 in [place] Do not drive yourself to the hospital. MAKE SURE YOU:   Understand these instructions.  Will watch this condition.  Will get help right away if you or your child is not doing well or gets worse. Document Released: 04/01/2008 Document Revised: 01/06/2012  Document Reviewed: 04/01/2008 [facility] Patient Information 2014 [place], [place].    Diet for Gastroesophageal Reflux Disease, Adult Reflux (acid reflux) is when acid from your stomach flows up into the esophagus. When acid comes in contact with the esophagus, the acid causes irritation and soreness (inflammation) in the esophagus. When reflux happens often or so severely that it causes damage to the esophagus, it is called gastroesophageal reflux disease (GERD). Nutrition therapy can help ease the discomfort of GERD. FOODS OR DRINKS TO AVOID OR LIMIT  Smoking or chewing tobacco. Nicotine is one of the most potent stimulants to acid production in the gastrointestinal tract.  Caffeinated and decaffeinated coffee and black tea.  Regular or low-calorie carbonated beverages or energy drinks (caffeine-free carbonated beverages are allowed).   Strong spices, such as black pepper, white pepper, red pepper, cayenne, curry powder, and chili powder.  Peppermint or spearmint.  Chocolate.  High-fat foods, including meats and fried foods. Extra added fats including oils, butter, salad dressings, and nuts. Limit these to less than 8 tsp per day.  Fruits and vegetables if they are not tolerated, such as citrus fruits or tomatoes.  Alcohol.  Any food that seems to aggravate your condition. If you have questions regarding your diet, call your caregiver or a registered  dietitian. OTHER THINGS THAT MAY HELP GERD INCLUDE:   Eating your meals slowly, in a relaxed setting.  Eating 5 to 6 small meals per day instead of 3 large meals.  Eliminating food for a period of time if it causes distress.  Not lying down until 3 hours after eating a meal.  Keeping the head of your bed raised 6 to 9 inches (15 to 23 cm) by using a foam wedge or blocks under the legs of the bed. Lying flat may make symptoms worse.  Being physically active. Weight loss may be helpful in reducing reflux in overweight or obese  adults.  Wear loose fitting clothing EXAMPLE MEAL PLAN This meal plan is approximately 2,000 calories based on [email or web address] meal planning guidelines. Breakfast   cup cooked oatmeal.  1 cup strawberries.  1 cup low-fat milk.  1 oz almonds. Snack  1 cup cucumber slices.  6 oz yogurt (made from low-fat or fat-free milk). Lunch  2 slice whole-wheat bread.  2 oz sliced [place].  2 tsp mayonnaise.  1 cup blueberries.  1 cup snap peas. Snack  6 whole-wheat crackers.  1 oz string cheese. Dinner   cup brown rice.  1 cup mixed veggies.  1 tsp olive oil.  3 oz grilled fish. Document Released: 10/14/2005 Document Revised: 01/06/2012 Document Reviewed: 08/30/2011 [facility] Patient Information 2014 [place], [place].    Health Maintenance, Female A healthy lifestyle and preventative care can promote health and wellness.  Maintain regular health, dental, and eye exams.  Eat a healthy diet. Foods like vegetables, fruits, whole grains, low-fat dairy products, and lean protein foods contain the nutrients you need without too many calories. Decrease your intake of foods high in solid fats, added sugars, and salt. Get information about a proper diet from your caregiver, if necessary.  Regular physical exercise is one of the most important things you can do for your health. Most adults should get at least 150 minutes of moderate-intensity exercise (any activity that increases your heart rate and causes you to sweat) each week. In addition, most adults need muscle-strengthening exercises on 2 or more days a week.   Maintain a healthy weight. The body mass index (BMI) is a screening tool to identify possible weight problems. It provides an estimate of body fat based on height and weight. Your caregiver can help determine your BMI, and can help you achieve or maintain a healthy weight. For adults 20 years and older:  A BMI below 18.5 is considered underweight.  A BMI of  18.5 to 24.9 is normal.  A BMI of 25 to 29.9 is considered overweight.  A BMI of 30 and above is considered obese.  Maintain normal blood lipids and cholesterol by exercising and minimizing your intake of saturated fat. Eat a balanced diet with plenty of fruits and vegetables. Blood tests for lipids and cholesterol should begin at age 25 and be repeated every 5 years. If your lipid or cholesterol levels are high, you are over 50, or you are a high risk for heart disease, you may need your cholesterol levels checked more frequently.Ongoing high lipid and cholesterol levels should be treated with medicines if diet and exercise are not effective.  If you smoke, find out from your caregiver how to quit. If you do not use tobacco, do not start.  Lung cancer screening is recommended for adults aged 45 80 years who are at high risk for developing lung cancer because of a history of smoking. Yearly low-dose computed  tomography (CT) is recommended for people who have at least a 30-pack-year history of smoking and are a current smoker or have quit within the past 15 years. A pack year of smoking is smoking an average of 1 pack of cigarettes a day for 1 year (for example: 1 pack a day for 30 years or 2 packs a day for 15 years). Yearly screening should continue until the smoker has stopped smoking for at least 15 years. Yearly screening should also be stopped for people who develop a health problem that would prevent them from having lung cancer treatment.  If you are pregnant, do not drink alcohol. If you are breastfeeding, be very cautious about drinking alcohol. If you are not pregnant and choose to drink alcohol, do not exceed 1 drink per day. One drink is considered to be 12 ounces (355 mL) of beer, 5 ounces (148 mL) of wine, or 1.5 ounces (44 mL) of liquor.  Avoid use of street drugs. Do not share needles with anyone. Ask for help if you need support or instructions about stopping the use of drugs.  High  blood pressure causes heart disease and increases the risk of stroke. Blood pressure should be checked at least every 1 to 2 years. Ongoing high blood pressure should be treated with medicines, if weight loss and exercise are not effective.  If you are 31 to 55 years old, ask your caregiver if you should take aspirin to prevent strokes.  Diabetes screening involves taking a blood sample to check your fasting blood sugar level. This should be done once every 3 years, after age 68, if you are within normal weight and without risk factors for diabetes. Testing should be considered at a younger age or be carried out more frequently if you are overweight and have at least 1 risk factor for diabetes.  Breast cancer screening is essential preventative care for women. You should practice "breast self-awareness." This means understanding the normal appearance and feel of your breasts and may include breast self-examination. Any changes detected, no matter how small, should be reported to a caregiver. Women in their 7s and 30s should have a clinical breast exam (CBE) by a caregiver as part of a regular health exam every 1 to 3 years. After age 65, women should have a CBE every year. Starting at age 58, women should consider having a mammogram (breast X-ray) every year. Women who have a family history of breast cancer should talk to their caregiver about genetic screening. Women at a high risk of breast cancer should talk to their caregiver about having an MRI and a mammogram every year.  Breast cancer gene (BRCA)-related cancer risk assessment is recommended for women who have family members with BRCA-related cancers. BRCA-related cancers include breast, ovarian, tubal, and peritoneal cancers. Having family members with these cancers may be associated with an increased risk for harmful changes (mutations) in the breast cancer genes BRCA1 and BRCA2. Results of the assessment will determine the need for genetic  counseling and BRCA1 and BRCA2 testing.  The Pap test is a screening test for cervical cancer. Women should have a Pap test starting at age 64. Between ages 49 and 32, Pap tests should be repeated every 2 years. Beginning at age 53, you should have a Pap test every 3 years as long as the past 3 Pap tests have been normal. If you had a hysterectomy for a problem that was not cancer or a condition that could lead to cancer,  then you no longer need Pap tests. If you are between ages 55 and 51, and you have had normal Pap tests going back 10 years, you no longer need Pap tests. If you have had past treatment for cervical cancer or a condition that could lead to cancer, you need Pap tests and screening for cancer for at least 20 years after your treatment. If Pap tests have been discontinued, risk factors (such as a new sexual partner) need to be reassessed to determine if screening should be resumed. Some women have medical problems that increase the chance of getting cervical cancer. In these cases, your caregiver may recommend more frequent screening and Pap tests.  The human papillomavirus (HPV) test is an additional test that may be used for cervical cancer screening. The HPV test looks for the virus that can cause the cell changes on the cervix. The cells collected during the Pap test can be tested for HPV. The HPV test could be used to screen women aged 44 years and older, and should be used in women of any age who have unclear Pap test results. After the age of 61, women should have HPV testing at the same frequency as a Pap test.  Colorectal cancer can be detected and often prevented. Most routine colorectal cancer screening begins at the age of 40 and continues through age 36. However, your caregiver may recommend screening at an earlier age if you have risk factors for colon cancer. On a yearly basis, your caregiver may provide home test kits to check for hidden blood in the stool. Use of a small camera  at the end of a tube, to directly examine the colon (sigmoidoscopy or colonoscopy), can detect the earliest forms of colorectal cancer. Talk to your caregiver about this at age 43, when routine screening begins. Direct examination of the colon should be repeated every 5 to 10 years through age 55, unless early forms of pre-cancerous polyps or small growths are found.  Hepatitis C blood testing is recommended for all people born from [birth year] through 1967 and any individual with known risks for hepatitis C.  Practice safe sex. Use condoms and avoid high-risk sexual practices to reduce the spread of sexually transmitted infections (STIs). Sexually active women aged 32 and younger should be checked for Chlamydia, which is a common sexually transmitted infection. Older women with new or multiple partners should also be tested for Chlamydia. Testing for other STIs is recommended if you are sexually active and at increased risk.  Osteoporosis is a disease in which the bones lose minerals and strength with aging. This can result in serious bone fractures. The risk of osteoporosis can be identified using a bone density scan. Women ages 59 and over and women at risk for fractures or osteoporosis should discuss screening with their caregivers. Ask your caregiver whether you should be taking a calcium supplement or vitamin D to reduce the rate of osteoporosis.  Menopause can be associated with physical symptoms and risks. Hormone replacement therapy is available to decrease symptoms and risks. You should talk to your caregiver about whether hormone replacement therapy is right for you.  Use sunscreen. Apply sunscreen liberally and repeatedly throughout the day. You should seek shade when your shadow is shorter than you. Protect yourself by wearing long sleeves, pants, a wide-brimmed hat, and sunglasses year round, whenever you are outdoors.  Notify your caregiver of new moles or changes in moles, especially if there is  a change in shape or color.  Also notify your caregiver if a mole is larger than the size of a pencil eraser.  Stay current with your immunizations. Document Released: 04/29/2011 Document Revised: 02/08/2013 Document Reviewed: 04/29/2011 [facility] Patient Information 2014 [place].

## 2013-11-24 ENCOUNTER — Encounter: Payer: Self-pay | Admitting: *Deleted

## 2013-11-30 ENCOUNTER — Ambulatory Visit (INDEPENDENT_AMBULATORY_CARE_PROVIDER_SITE_OTHER): Payer: BC Managed Care – PPO | Admitting: Gastroenterology

## 2013-11-30 ENCOUNTER — Encounter: Payer: Self-pay | Admitting: Gastroenterology

## 2013-11-30 ENCOUNTER — Other Ambulatory Visit (INDEPENDENT_AMBULATORY_CARE_PROVIDER_SITE_OTHER): Payer: BC Managed Care – PPO

## 2013-11-30 VITALS — BP 110/84 | HR 96 | Ht 60.63 in | Wt 201.1 lb

## 2013-11-30 DIAGNOSIS — Z86010 Personal history of colonic polyps: Secondary | ICD-10-CM

## 2013-11-30 DIAGNOSIS — K625 Hemorrhage of anus and rectum: Secondary | ICD-10-CM

## 2013-11-30 DIAGNOSIS — Z0000 Encounter for general adult medical examination without abnormal findings: Secondary | ICD-10-CM

## 2013-11-30 DIAGNOSIS — K219 Gastro-esophageal reflux disease without esophagitis: Secondary | ICD-10-CM

## 2013-11-30 LAB — BASIC METABOLIC PANEL
BUN: 14 mg/dL (ref 6–23)
CO2: 26 meq/L (ref 19–32)
Calcium: 9.8 mg/dL (ref 8.4–10.5)
Chloride: 107 mEq/L (ref 96–112)
Creatinine, Ser: 0.9 mg/dL (ref 0.4–1.2)
GFR: 88.24 mL/min (ref >60.00)
Glucose, Bld: 95 mg/dL (ref 70–99)
POTASSIUM: 4.4 meq/L (ref 3.5–5.1)
SODIUM: 141 meq/L (ref 135–145)

## 2013-11-30 LAB — LIPID PANEL
CHOLESTEROL: 216 mg/dL — AB (ref 0–200)
HDL: 43.4 mg/dL (ref >39.00)
Total CHOL/HDL Ratio: 5
Triglycerides: 128 mg/dL (ref 0.0–149.0)
VLDL: 25.6 mg/dL (ref 0.0–40.0)

## 2013-11-30 LAB — CBC WITH DIFFERENTIAL/PLATELET
BASOS ABS: 0 10*3/uL (ref 0.0–0.1)
Basophils Relative: 0.3 % (ref 0.0–3.0)
EOS PCT: 2.9 % (ref 0.0–5.0)
Eosinophils Absolute: 0.2 10*3/uL (ref 0.0–0.7)
HCT: 42.6 % (ref 36.0–46.0)
Hemoglobin: 13.9 g/dL (ref 12.0–15.0)
LYMPHS ABS: 2.9 10*3/uL (ref 0.7–4.0)
Lymphocytes Relative: 38.4 % (ref 12.0–46.0)
MCHC: 32.7 g/dL (ref 30.0–36.0)
MCV: 86.4 fl (ref 78.0–100.0)
MONOS PCT: 6.2 % (ref 3.0–12.0)
Monocytes Absolute: 0.5 10*3/uL (ref 0.1–1.0)
NEUTROS PCT: 52.2 % (ref 43.0–77.0)
Neutro Abs: 4 10*3/uL (ref 1.4–7.7)
PLATELETS: 335 10*3/uL (ref 150.0–400.0)
RBC: 4.93 Mil/uL (ref 3.87–5.11)
RDW: 12.6 % (ref 11.5–14.6)
WBC: 7.6 10*3/uL (ref 4.5–10.5)

## 2013-11-30 LAB — HEPATIC FUNCTION PANEL
ALBUMIN: 3.6 g/dL (ref 3.5–5.2)
ALT: 20 U/L (ref 0–35)
AST: 22 U/L (ref 0–37)
Alkaline Phosphatase: 65 U/L (ref 39–117)
BILIRUBIN TOTAL: 0.9 mg/dL (ref 0.3–1.2)
Bilirubin, Direct: 0.1 mg/dL (ref 0.0–0.3)
Total Protein: 7.9 g/dL (ref 6.0–8.3)

## 2013-11-30 LAB — LDL CHOLESTEROL, DIRECT: Direct LDL: 150.8 mg/dL

## 2013-11-30 LAB — URINALYSIS, ROUTINE W REFLEX MICROSCOPIC
Bilirubin Urine: NEGATIVE
Hgb urine dipstick: NEGATIVE
Ketones, ur: NEGATIVE
Leukocytes, UA: NEGATIVE
Nitrite: NEGATIVE
PH: 5.5 (ref 5.0–8.0)
Total Protein, Urine: NEGATIVE
URINE GLUCOSE: NEGATIVE
UROBILINOGEN UA: 0.2 (ref 0.0–1.0)

## 2013-11-30 LAB — TSH: TSH: 0.58 u[IU]/mL (ref 0.35–5.50)

## 2013-11-30 MED ORDER — DEXLANSOPRAZOLE 30 MG PO CPDR: 30.0000 mg | DELAYED_RELEASE_CAPSULE | Freq: Every day | ORAL | Status: DC

## 2013-11-30 MED ORDER — MOVIPREP 100 G PO SOLR: 1.0000 | Freq: Once | ORAL | Status: DC

## 2013-11-30 NOTE — Progress Notes (Signed)
This is a 53 year old African American female with chronic GERD and a large hernia.  She is doing well on Dexilant 60 mg a day and denies upper GI complaints.  She recently has had some bright red blood per rectum without abdominal rectal pain.  Her last colonoscopy was performed in 2008.  Family history is noncontributory.  Her appetite is good her weight is stable.  She denies other gastrointestinal issues.  Recent CBC and metabolic profiles were normal.  She generally has regular bowel movements and has not had diarrhea or severe constipation.  Review of her previous colonoscopy shows multiple hyperplastic polyps but no adenomatous.  Current Medications, Allergies, Past Medical History, Past Surgical History, Family History and Social History were reviewed in [employer] record.  ROS: All systems were reviewed and are negative unless otherwise stated in the HPI.          Physical Exam: Healthy-appearing patient in no distress.  Blood pressure 110/84, pulse 96, and weight 201 with a BMI of 38.47.  I cannot appreciate stigmata of chronic liver disease.  She is to be in a regular rhythm without murmurs gallops or rubs, and chest is clear.  There is no hepatosplenomegaly, abdominal masses or tenderness.  Bowel sounds are normal.  Inspection the rectum is generally unremarkable as is rectal exam.  There is formed stool in the rectal vault which is guaiac negative.    Assessment and Plan: Asymptomatic rectal bleeding in a 53 year old otherwise asymptomatic patient.  I've scheduled her for followup colonoscopy since it is been 8 years since her last exam.  Her acid reflux is doing extremely well on Dexilant 60 mg a day which I have renewed, and also reviewed antireflux maneuvers with this patient.  She is to continue other medications and followup with primary care as scheduled.

## 2013-11-30 NOTE — Patient Instructions (Signed)
You have been scheduled for a colonoscopy with propofol. Please follow written instructions given to you at your visit today.  Please pick up your prep kit at the pharmacy within the next 1-3 days. If you use inhalers (even only as needed), please bring them with you on the day of your procedure. Your physician has requested that you go to www.startemmi.com and enter the access code given to you at your visit today. This web site gives a general overview about your procedure. However, you should still follow specific instructions given to you by our office regarding your preparation for the procedure.  We have sent the following medications to your pharmacy for you to pick up at your convenience: Dexilant

## 2013-12-01 LAB — HM COLONOSCOPY

## 2013-12-07 ENCOUNTER — Ambulatory Visit: Payer: BC Managed Care – PPO | Admitting: Internal Medicine

## 2013-12-07 ENCOUNTER — Telehealth: Payer: Self-pay | Admitting: Gastroenterology

## 2013-12-07 NOTE — Telephone Encounter (Signed)
Call patiet back concerning mixing prep wrong. Instructed to come to facility now and pick up new prep.

## 2013-12-08 ENCOUNTER — Other Ambulatory Visit: Payer: Self-pay | Admitting: *Deleted

## 2013-12-08 ENCOUNTER — Ambulatory Visit (AMBULATORY_SURGERY_CENTER): Payer: BC Managed Care – PPO | Admitting: Gastroenterology

## 2013-12-08 ENCOUNTER — Encounter: Payer: Self-pay | Admitting: Gastroenterology

## 2013-12-08 VITALS — BP 139/73 | HR 65 | Temp 97.3°F | Resp 12 | Ht 60.0 in | Wt 201.0 lb

## 2013-12-08 DIAGNOSIS — D126 Benign neoplasm of colon, unspecified: Secondary | ICD-10-CM

## 2013-12-08 DIAGNOSIS — Z1211 Encounter for screening for malignant neoplasm of colon: Secondary | ICD-10-CM

## 2013-12-08 DIAGNOSIS — R109 Unspecified abdominal pain: Secondary | ICD-10-CM

## 2013-12-08 DIAGNOSIS — K625 Hemorrhage of anus and rectum: Secondary | ICD-10-CM

## 2013-12-08 DIAGNOSIS — K5730 Diverticulosis of large intestine without perforation or abscess without bleeding: Secondary | ICD-10-CM

## 2013-12-08 MED ORDER — SODIUM CHLORIDE 0.9 % IV SOLN: 500.0000 mL | INTRAVENOUS | Status: DC

## 2013-12-08 MED ORDER — PRAMOXINE-HC 1-1 % EX CREA: TOPICAL_CREAM | CUTANEOUS | Status: DC | PRN

## 2013-12-08 NOTE — Progress Notes (Signed)
Called into room post procedure and instructed per tech darlene massey location and removal  of polyps for pt.  No further instructions per md. ewm

## 2013-12-08 NOTE — Progress Notes (Signed)
1032 A/ox3 pleased with MAC, report to Auburn Bilberry RN

## 2013-12-08 NOTE — Op Note (Addendum)
[facility]  [address]. [place], [address]   COLONOSCOPY PROCEDURE REPORT  PATIENT: Marie Torres, Marie Torres  MR#: [id number] BIRTHDATE: [birth year] , 45  yrs. old GENDER: Female ENDOSCOPIST: Sable Feil, MD, [facility] REFERRED BY: PROCEDURE DATE:  12/08/2013 PROCEDURE:   Colonoscopy with snare polypectomy and Colonoscopy with biopsy Prior Negative Screening - Now for repeat screening.  N/A Prior Negative Screening - Now for repeat screening.  Less than 10 yrs Polyps Removed Today? Yes. ASA CLASS:   Class II INDICATIONS:Rectal Bleeding and average risk screening. MEDICATIONS: propofol (Diprivan) 250mg  IV  DESCRIPTION OF PROCEDURE:   After the risks benefits and alternatives of the procedure were thoroughly explained, informed consent was obtained.  A digital rectal exam revealed a normal rectal and perianal exam.   The LB [id number]  endoscope was introduced through the anus and advanced to the cecum, which was identified by both the appendix and ileocecal valve. No adverse events experienced.   The quality of the prep was excellent, using MoviPrep  The instrument was then slowly withdrawn as the colon was fully examined.      COLON FINDINGS: There was moderate diverticulosis noted throughout the entire examined colon, in the descending colon, and sigmoid colon with associated colonic spasm.  Retroflexed views revealed no abnormalities. Multiple hyperplastic nodules in rectosigmoid biopsy and snare removed.Very redundant perianal skin and tissue,no heme or true hemorrhoids noted. The time to cecum=2 minutes 300 seconds. Withdrawal time=11 minutes 04 seconds.  The scope was withdrawn and the procedure completed. COMPLICATIONS: There were no complications.  ENDOSCOPIC IMPRESSION:Diverticulosis coli and hyperplastic left colon polyps,r/o adenomas. Rectal bleedind from local anal area probably associated with straining.   RECOMMENDATIONS: 1.  Await biopsy  results 2.  Repeat Colonoscopy in 5 years. 3.  Continue current medications 4. prn local analpram cream prn   eSigned:  Sable Feil, MD, [facility] 12/08/2013 10:34 AM   cc:   PATIENT NAME:  [name], [name] MR#: [id number]

## 2013-12-08 NOTE — Patient Instructions (Signed)
Findings:  Diverticulosis, polyps Recommendations:  Wait for pathology results, continue current medications.  analpram as needed and benefiber two times daily. High fiber diet  YOU HAD AN ENDOSCOPIC PROCEDURE TODAY AT [facility]: Refer to the procedure report that was given to you for any specific questions about what was found during the examination.  If the procedure report does not answer your questions, please call your gastroenterologist to clarify.  If you requested that your care partner not be given the details of your procedure findings, then the procedure report has been included in a sealed envelope for you to review at your convenience later.  YOU SHOULD EXPECT: Some feelings of bloating in the abdomen. Passage of more gas than usual.  Walking can help get rid of the air that was put into your GI tract during the procedure and reduce the bloating. If you had a lower endoscopy (such as a colonoscopy or flexible sigmoidoscopy) you may notice spotting of blood in your stool or on the toilet paper. If you underwent a bowel prep for your procedure, then you may not have a normal bowel movement for a few days.  DIET: Your first meal following the procedure should be a light meal and then it is ok to progress to your normal diet.  A half-sandwich or bowl of soup is an example of a good first meal.  Heavy or fried foods are harder to digest and may make you feel nauseous or bloated.  Likewise meals heavy in dairy and vegetables can cause extra gas to form and this can also increase the bloating.  Drink plenty of fluids but you should avoid alcoholic beverages for 24 hours.  ACTIVITY: Your care partner should take you home directly after the procedure.  You should plan to take it easy, moving slowly for the rest of the day.  You can resume normal activity the day after the procedure however you should NOT DRIVE or use heavy machinery for 24 hours (because of the sedation medicines used  during the test).    SYMPTOMS TO REPORT IMMEDIATELY: A gastroenterologist can be reached at any hour.  During normal business hours, 8:30 AM to 5:00 PM Monday through Friday, call [phone].  After hours and on weekends, please call the GI answering service at [phone] who will take a message and have the physician on call contact you.   Following lower endoscopy (colonoscopy or flexible sigmoidoscopy):  Excessive amounts of blood in the stool  Significant tenderness or worsening of abdominal pains  Swelling of the abdomen that is new, acute  Fever of 100F or higher  Following upper endoscopy (EGD)  Vomiting of blood or coffee ground material  New chest pain or pain under the shoulder blades  Painful or persistently difficult swallowing  New shortness of breath  Fever of 100F or higher  Black, tarry-looking stools  FOLLOW UP: If any biopsies were taken you will be contacted by phone or by letter within the next 1-3 weeks.  Call your gastroenterologist if you have not heard about the biopsies in 3 weeks.  Our staff will call the home number listed on your records the next business day following your procedure to check on you and address any questions or concerns that you may have at that time regarding the information given to you following your procedure. This is a courtesy call and so if there is no answer at the home number and we have not heard from you  through the emergency physician on call, we will assume that you have returned to your regular daily activities without incident.  SIGNATURES/CONFIDENTIALITY: You and/or your care partner have signed paperwork which will be entered into your electronic medical record.  These signatures attest to the fact that that the information above on your After Visit Summary has been reviewed and is understood.  Full responsibility of the confidentiality of this discharge information lies with you and/or your care-partner.  Please  follow all discharge instructions given to you by the recovery room nurse. If you have any questions or problems after discharge please call one of the numbers listed above. You will receive a phone call in the am to see how you are doing and answer any questions you may have. Thank you for choosing [facility] for your health care needs.

## 2013-12-09 ENCOUNTER — Telehealth: Payer: Self-pay | Admitting: *Deleted

## 2013-12-09 NOTE — Telephone Encounter (Signed)
  Follow up Call-  Call back number 12/08/2013  Post procedure Call Back phone  # [phone]  Permission to leave phone message Yes     Patient questions:  Do you have a fever, pain , or abdominal swelling? no Pain Score  0 *  Have you tolerated food without any problems? yes  Have you been able to return to your normal activities? yes  Do you have any questions about your discharge instructions: Diet   no Medications  no Follow up visit  no  Do you have questions or concerns about your Care? no  Actions: * If pain score is 4 or above: No action needed, pain <4.

## 2013-12-15 ENCOUNTER — Encounter: Payer: Self-pay | Admitting: Obstetrics & Gynecology

## 2013-12-16 ENCOUNTER — Encounter: Payer: Self-pay | Admitting: Gastroenterology

## 2013-12-22 ENCOUNTER — Ambulatory Visit
Admission: RE | Admit: 2013-12-22 | Discharge: 2013-12-22 | Disposition: A | Discharge status: Home / Self Care | Payer: BC Managed Care – PPO | Source: Ambulatory Visit | Attending: Physician Assistant | Admitting: Physician Assistant

## 2013-12-22 DIAGNOSIS — Z0000 Encounter for general adult medical examination without abnormal findings: Secondary | ICD-10-CM

## 2013-12-24 LAB — HM MAMMOGRAPHY: HM MAMMO: NORMAL

## 2014-01-31 ENCOUNTER — Encounter: Payer: Self-pay | Admitting: Obstetrics & Gynecology

## 2014-01-31 ENCOUNTER — Ambulatory Visit (INDEPENDENT_AMBULATORY_CARE_PROVIDER_SITE_OTHER): Payer: BC Managed Care – PPO | Admitting: Obstetrics & Gynecology

## 2014-01-31 VITALS — BP 117/82 | HR 67 | Temp 97.6°F | Ht 62.0 in | Wt 202.5 lb

## 2014-01-31 DIAGNOSIS — Z01419 Encounter for gynecological examination (general) (routine) without abnormal findings: Secondary | ICD-10-CM

## 2014-01-31 DIAGNOSIS — Z124 Encounter for screening for malignant neoplasm of cervix: Secondary | ICD-10-CM

## 2014-01-31 NOTE — Progress Notes (Signed)
     GYNECOLOGY CLINIC ANNUAL PREVENTATIVE CARE ENCOUNTER NOTE  Subjective:     Merlin Ege is a 53 y.o. G1P1  female here for a routine annual gynecologic exam.  Current complaints: none     Gynecologic History No LMP recorded. Patient is postmenopausal. Contraception: none Last Pap: 2014. Results were: normal Last mammogram: 12/24/13. Results were: normal  Obstetric History OB History  Gravida Para Term Preterm AB SAB TAB Ectopic Multiple Living  1 1 1       1     # Outcome Date GA Lbr Len/2nd Weight Sex Delivery Anes PTL Lv  1 TRM      SVD         The following portions of the patient's history were reviewed and updated as appropriate: allergies, current medications, past family history, past medical history, past social history, past surgical history and problem list.  Review of Systems A comprehensive review of systems was negative.    Objective:   BP 117/82  Pulse 67  Temp(Src) 97.6 F (36.4 C) (Oral)  Ht 5\' 2"  (1.575 m)  Wt 202 lb 8 oz (91.853 kg)  BMI 37.03 kg/m2 GENERAL: Well-developed, well-nourished female in no acute distress.  HEENT: Normocephalic, atraumatic. Sclerae anicteric.  NECK: Supple. Normal thyroid.  LUNGS: Clear to auscultation bilaterally.  HEART: Regular rate and rhythm. BREASTS: Symmetric in size. No masses, skin changes, nipple drainage, or lymphadenopathy. ABDOMEN: Soft, nontender, nondistended. No organomegaly. PELVIC: Normal external female genitalia. Two 2 mm open sores noted on mid portion left labium majus, no erythema. Mild TTP (patient claims this happened 2/2 shaving).  Vagina is pink and rugated.  Normal discharge. Normal cervix contour. Pap smear obtained. Uterus is normal in size. No adnexal mass or tenderness.  EXTREMITIES: No cyanosis, clubbing, or edema, 2+ distal pulses.   Assessment:   Annual gynecologic examination   Plan:   Pap done, will follow up results and manage accordingly. Patient was told to call if vulvar  lesions get worse. Routine preventative health maintenance measures emphasized   Verita Schneiders, MD, [name], [place]

## 2014-01-31 NOTE — Patient Instructions (Signed)
Preventive Care for Adults, Female A healthy lifestyle and preventive care can promote health and wellness. Preventive health guidelines for women include the following key practices.  A routine yearly physical is a good way to check with your health care provider about your health and preventive screening. It is a chance to share any concerns and updates on your health and to receive a thorough exam.  Visit your dentist for a routine exam and preventive care every 6 months. Brush your teeth twice a day and floss once a day. Good oral hygiene prevents tooth decay and gum disease.  The frequency of eye exams is based on your age, health, family medical history, use of contact lenses, and other factors. Follow your health care provider's recommendations for frequency of eye exams.  Eat a healthy diet. Foods like vegetables, fruits, whole grains, low-fat dairy products, and lean protein foods contain the nutrients you need without too many calories. Decrease your intake of foods high in solid fats, added sugars, and salt. Eat the right amount of calories for you.Get information about a proper diet from your health care provider, if necessary.  Regular physical exercise is one of the most important things you can do for your health. Most adults should get at least 150 minutes of moderate-intensity exercise (any activity that increases your heart rate and causes you to sweat) each week. In addition, most adults need muscle-strengthening exercises on 2 or more days a week.  Maintain a healthy weight. The body mass index (BMI) is a screening tool to identify possible weight problems. It provides an estimate of body fat based on height and weight. Your health care provider can find your BMI, and can help you achieve or maintain a healthy weight.For adults 20 years and older:  A BMI below 18.5 is considered underweight.  A BMI of 18.5 to 24.9 is normal.  A BMI of 25 to 29.9 is considered  overweight.  A BMI of 30 and above is considered obese.  Maintain normal blood lipids and cholesterol levels by exercising and minimizing your intake of saturated fat. Eat a balanced diet with plenty of fruit and vegetables. Blood tests for lipids and cholesterol should begin at age 20 and be repeated every 5 years. If your lipid or cholesterol levels are high, you are over 50, or you are at high risk for heart disease, you may need your cholesterol levels checked more frequently.Ongoing high lipid and cholesterol levels should be treated with medicines if diet and exercise are not working.  If you smoke, find out from your health care provider how to quit. If you do not use tobacco, do not start.  Lung cancer screening is recommended for adults aged 55 80 years who are at high risk for developing lung cancer because of a history of smoking. A yearly low-dose CT scan of the lungs is recommended for people who have at least a 30-pack-year history of smoking and are a current smoker or have quit within the past 15 years. A pack year of smoking is smoking an average of 1 pack of cigarettes a day for 1 year (for example: 1 pack a day for 30 years or 2 packs a day for 15 years). Yearly screening should continue until the smoker has stopped smoking for at least 15 years. Yearly screening should be stopped for people who develop a health problem that would prevent them from having lung cancer treatment.  If you are pregnant, do not drink alcohol. If you   are breastfeeding, be very cautious about drinking alcohol. If you are not pregnant and choose to drink alcohol, do not have more than 1 drink per day. One drink is considered to be 12 ounces (355 mL) of beer, 5 ounces (148 mL) of wine, or 1.5 ounces (44 mL) of liquor.  Avoid use of street drugs. Do not share needles with anyone. Ask for help if you need support or instructions about stopping the use of drugs.  High blood pressure causes heart disease and  increases the risk of stroke. Your blood pressure should be checked at least every 1 to 2 years. Ongoing high blood pressure should be treated with medicines if weight loss and exercise do not work.  If you are 20 55 years old, ask your health care provider if you should take aspirin to prevent strokes.  Diabetes screening involves taking a blood sample to check your fasting blood sugar level. This should be done once every 3 years, after age 35, if you are within normal weight and without risk factors for diabetes. Testing should be considered at a younger age or be carried out more frequently if you are overweight and have at least 1 risk factor for diabetes.  Breast cancer screening is essential preventive care for women. You should practice "breast self-awareness." This means understanding the normal appearance and feel of your breasts and may include breast self-examination. Any changes detected, no matter how small, should be reported to a health care provider. Women in their 42s and 30s should have a clinical breast exam (CBE) by a health care provider as part of a regular health exam every 1 to 3 years. After age 74, women should have a CBE every year. Starting at age 43, women should consider having a mammogram (breast X-ray test) every year. Women who have a family history of breast cancer should talk to their health care provider about genetic screening. Women at a high risk of breast cancer should talk to their health care providers about having an MRI and a mammogram every year.  Breast cancer gene (BRCA)-related cancer risk assessment is recommended for women who have family members with BRCA-related cancers. BRCA-related cancers include breast, ovarian, tubal, and peritoneal cancers. Having family members with these cancers may be associated with an increased risk for harmful changes (mutations) in the breast cancer genes BRCA1 and BRCA2. Results of the assessment will determine the need for  genetic counseling and BRCA1 and BRCA2 testing.  The Pap test is a screening test for cervical cancer. A Pap test can show cell changes on the cervix that might become cervical cancer if left untreated. A Pap test is a procedure in which cells are obtained and examined from the lower end of the uterus (cervix).  Women should have a Pap test starting at age 60.  Between ages 63 and 62, Pap tests should be repeated every 2 years.  Beginning at age 43, you should have a Pap test every 3 years as long as the past 3 Pap tests have been normal.  Some women have medical problems that increase the chance of getting cervical cancer. Talk to your health care provider about these problems. It is especially important to talk to your health care provider if a new problem develops soon after your last Pap test. In these cases, your health care provider may recommend more frequent screening and Pap tests.  The above recommendations are the same for women who have or have not gotten the vaccine  for human papillomavirus (HPV).  If you had a hysterectomy for a problem that was not cancer or a condition that could lead to cancer, then you no longer need Pap tests. Even if you no longer need a Pap test, a regular exam is a good idea to make sure no other problems are starting.  If you are between ages 65 and 70 years, and you have had normal Pap tests going back 10 years, you no longer need Pap tests. Even if you no longer need a Pap test, a regular exam is a good idea to make sure no other problems are starting.  If you have had past treatment for cervical cancer or a condition that could lead to cancer, you need Pap tests and screening for cancer for at least 20 years after your treatment.  If Pap tests have been discontinued, risk factors (such as a new sexual partner) need to be reassessed to determine if screening should be resumed.  The HPV test is an additional test that may be used for cervical cancer  screening. The HPV test looks for the virus that can cause the cell changes on the cervix. The cells collected during the Pap test can be tested for HPV. The HPV test could be used to screen women aged 30 years and older, and should be used in women of any age who have unclear Pap test results. After the age of 30, women should have HPV testing at the same frequency as a Pap test.  Colorectal cancer can be detected and often prevented. Most routine colorectal cancer screening begins at the age of 50 years and continues through age 75 years. However, your health care provider may recommend screening at an earlier age if you have risk factors for colon cancer. On a yearly basis, your health care provider may provide home test kits to check for hidden blood in the stool. Use of a small camera at the end of a tube, to directly examine the colon (sigmoidoscopy or colonoscopy), can detect the earliest forms of colorectal cancer. Talk to your health care provider about this at age 50, when routine screening begins. Direct exam of the colon should be repeated every 5 10 years through age 75 years, unless early forms of pre-cancerous polyps or small growths are found.  People who are at an increased risk for hepatitis B should be screened for this virus. You are considered at high risk for hepatitis B if:  You were born in a country where hepatitis B occurs often. Talk with your health care provider about which countries are considered high risk.  Your parents were born in a high-risk country and you have not received a shot to protect against hepatitis B (hepatitis B vaccine).  You have HIV or AIDS.  You use needles to inject street drugs.  You live with, or have sex with, someone who has Hepatitis B.  You get hemodialysis treatment.  You take certain medicines for conditions like cancer, organ transplantation, and autoimmune conditions.  Hepatitis C blood testing is recommended for all people born from  1945 through 1965 and any individual with known risks for hepatitis C.  Practice safe sex. Use condoms and avoid high-risk sexual practices to reduce the spread of sexually transmitted infections (STIs). STIs include gonorrhea, chlamydia, syphilis, trichomonas, herpes, HPV, and human immunodeficiency virus (HIV). Herpes, HIV, and HPV are viral illnesses that have no cure. They can result in disability, cancer, and death. Sexually active women aged 25   years and younger should be checked for chlamydia. Older women with new or multiple partners should also be tested for chlamydia. Testing for other STIs is recommended if you are sexually active and at increased risk.  Osteoporosis is a disease in which the bones lose minerals and strength with aging. This can result in serious bone fractures or breaks. The risk of osteoporosis can be identified using a bone density scan. Women ages 65 years and over and women at risk for fractures or osteoporosis should discuss screening with their health care providers. Ask your health care provider whether you should take a calcium supplement or vitamin D to reduce the rate of osteoporosis.  Menopause can be associated with physical symptoms and risks. Hormone replacement therapy is available to decrease symptoms and risks. You should talk to your health care provider about whether hormone replacement therapy is right for you.  Use sunscreen. Apply sunscreen liberally and repeatedly throughout the day. You should seek shade when your shadow is shorter than you. Protect yourself by wearing long sleeves, pants, a wide-brimmed hat, and sunglasses year round, whenever you are outdoors.  Once a month, do a whole body skin exam, using a mirror to look at the skin on your back. Tell your health care provider of new moles, moles that have irregular borders, moles that are larger than a pencil eraser, or moles that have changed in shape or color.  Stay current with required  vaccines (immunizations).  Influenza vaccine. All adults should be immunized every year.  Tetanus, diphtheria, and acellular pertussis (Td, Tdap) vaccine. Pregnant women should receive 1 dose of Tdap vaccine during each pregnancy. The dose should be obtained regardless of the length of time since the last dose. Immunization is preferred during the 27th 36th week of gestation. An adult who has not previously received Tdap or who does not know her vaccine status should receive 1 dose of Tdap. This initial dose should be followed by tetanus and diphtheria toxoids (Td) booster doses every 10 years. Adults with an unknown or incomplete history of completing a 3-dose immunization series with Td-containing vaccines should begin or complete a primary immunization series including a Tdap dose. Adults should receive a Td booster every 10 years.  Varicella vaccine. An adult without evidence of immunity to varicella should receive 2 doses or a second dose if she has previously received 1 dose. Pregnant females who do not have evidence of immunity should receive the first dose after pregnancy. This first dose should be obtained before leaving the health care facility. The second dose should be obtained 4 8 weeks after the first dose.  Human papillomavirus (HPV) vaccine. Females aged 13 26 years who have not received the vaccine previously should obtain the 3-dose series. The vaccine is not recommended for use in pregnant females. However, pregnancy testing is not needed before receiving a dose. If a female is found to be pregnant after receiving a dose, no treatment is needed. In that case, the remaining doses should be delayed until after the pregnancy. Immunization is recommended for any person with an immunocompromised condition through the age of 26 years if she did not get any or all doses earlier. During the 3-dose series, the second dose should be obtained 4 8 weeks after the first dose. The third dose should be  obtained 24 weeks after the first dose and 16 weeks after the second dose.  Zoster vaccine. One dose is recommended for adults aged 60 years or older unless certain   conditions are present.  Measles, mumps, and rubella (MMR) vaccine. Adults born before 1957 generally are considered immune to measles and mumps. Adults born in 1957 or later should have 1 or more doses of MMR vaccine unless there is a contraindication to the vaccine or there is laboratory evidence of immunity to each of the three diseases. A routine second dose of MMR vaccine should be obtained at least 28 days after the first dose for students attending postsecondary schools, health care workers, or international travelers. People who received inactivated measles vaccine or an unknown type of measles vaccine during 1963 1967 should receive 2 doses of MMR vaccine. People who received inactivated mumps vaccine or an unknown type of mumps vaccine before 1979 and are at high risk for mumps infection should consider immunization with 2 doses of MMR vaccine. For females of childbearing age, rubella immunity should be determined. If there is no evidence of immunity, females who are not pregnant should be vaccinated. If there is no evidence of immunity, females who are pregnant should delay immunization until after pregnancy. Unvaccinated health care workers born before 1957 who lack laboratory evidence of measles, mumps, or rubella immunity or laboratory confirmation of disease should consider measles and mumps immunization with 2 doses of MMR vaccine or rubella immunization with 1 dose of MMR vaccine.  Pneumococcal 13-valent conjugate (PCV13) vaccine. When indicated, a person who is uncertain of her immunization history and has no record of immunization should receive the PCV13 vaccine. An adult aged 19 years or older who has certain medical conditions and has not been previously immunized should receive 1 dose of PCV13 vaccine. This PCV13 should be  followed with a dose of pneumococcal polysaccharide (PPSV23) vaccine. The PPSV23 vaccine dose should be obtained at least 8 weeks after the dose of PCV13 vaccine. An adult aged 19 years or older who has certain medical conditions and previously received 1 or more doses of PPSV23 vaccine should receive 1 dose of PCV13. The PCV13 vaccine dose should be obtained 1 or more years after the last PPSV23 vaccine dose.  Pneumococcal polysaccharide (PPSV23) vaccine. When PCV13 is also indicated, PCV13 should be obtained first. All adults aged 65 years and older should be immunized. An adult younger than age 65 years who has certain medical conditions should be immunized. Any person who resides in a nursing home or long-term care facility should be immunized. An adult smoker should be immunized. People with an immunocompromised condition and certain other conditions should receive both PCV13 and PPSV23 vaccines. People with human immunodeficiency virus (HIV) infection should be immunized as soon as possible after diagnosis. Immunization during chemotherapy or radiation therapy should be avoided. Routine use of PPSV23 vaccine is not recommended for American Indians, [place] Natives, or people younger than 65 years unless there are medical conditions that require PPSV23 vaccine. When indicated, people who have unknown immunization and have no record of immunization should receive PPSV23 vaccine. One-time revaccination 5 years after the first dose of PPSV23 is recommended for people aged 19 64 years who have chronic kidney failure, nephrotic syndrome, asplenia, or immunocompromised conditions. People who received 1 2 doses of PPSV23 before age 65 years should receive another dose of PPSV23 vaccine at age 65 years or later if at least 5 years have passed since the previous dose. Doses of PPSV23 are not needed for people immunized with PPSV23 at or after age 65 years.  Meningococcal vaccine. Adults with asplenia or persistent  complement component deficiencies should receive 2   doses of quadrivalent meningococcal conjugate (MenACWY-D) vaccine. The doses should be obtained at least 2 months apart. Microbiologists working with certain meningococcal bacteria, military recruits, people at risk during an outbreak, and people who travel to or live in countries with a high rate of meningitis should be immunized. A first-year college student up through age 21 years who is living in a residence hall should receive a dose if she did not receive a dose on or after her 16th birthday. Adults who have certain high-risk conditions should receive one or more doses of vaccine.  Hepatitis A vaccine. Adults who wish to be protected from this disease, have certain high-risk conditions, work with hepatitis A-infected animals, work in hepatitis A research labs, or travel to or work in countries with a high rate of hepatitis A should be immunized. Adults who were previously unvaccinated and who anticipate close contact with an international adoptee during the first 60 days after arrival in the [place] from a country with a high rate of hepatitis A should be immunized.  Hepatitis B vaccine. Adults who wish to be protected from this disease, have certain high-risk conditions, may be exposed to blood or other infectious body fluids, are household contacts or sex partners of hepatitis B positive people, are clients or workers in certain care facilities, or travel to or work in countries with a high rate of hepatitis B should be immunized.  Haemophilus influenzae type b (Hib) vaccine. A previously unvaccinated person with asplenia or sickle cell disease or having a scheduled splenectomy should receive 1 dose of Hib vaccine. Regardless of previous immunization, a recipient of a hematopoietic stem cell transplant should receive a 3-dose series 6 12 months after her successful transplant. Hib vaccine is not recommended for adults with HIV  infection. Preventive Services / Frequency Ages 19 to 39years  Blood pressure check.** / Every 1 to 2 years.  Lipid and cholesterol check.** / Every 5 years beginning at age 20.  Clinical breast exam.** / Every 3 years for women in their 20s and 30s.  BRCA-related cancer risk assessment.** / For women who have family members with a BRCA-related cancer (breast, ovarian, tubal, or peritoneal cancers).  Pap test.** / Every 2 years from ages 21 through 29. Every 3 years starting at age 30 through age 65 or 70 with a history of 3 consecutive normal Pap tests.  HPV screening.** / Every 3 years from ages 30 through ages 65 to 70 with a history of 3 consecutive normal Pap tests.  Hepatitis C blood test.** / For any individual with known risks for hepatitis C.  Skin self-exam. / Monthly.  Influenza vaccine. / Every year.  Tetanus, diphtheria, and acellular pertussis (Tdap, Td) vaccine.** / Consult your health care provider. Pregnant women should receive 1 dose of Tdap vaccine during each pregnancy. 1 dose of Td every 10 years.  Varicella vaccine.** / Consult your health care provider. Pregnant females who do not have evidence of immunity should receive the first dose after pregnancy.  HPV vaccine. / 3 doses over 6 months, if 26 and younger. The vaccine is not recommended for use in pregnant females. However, pregnancy testing is not needed before receiving a dose.  Measles, mumps, rubella (MMR) vaccine.** / You need at least 1 dose of MMR if you were born in 1957 or later. You may also need a 2nd dose. For females of childbearing age, rubella immunity should be determined. If there is no evidence of immunity, females who are not   pregnant should be vaccinated. If there is no evidence of immunity, females who are pregnant should delay immunization until after pregnancy.  Pneumococcal 13-valent conjugate (PCV13) vaccine.** / Consult your health care provider.  Pneumococcal polysaccharide (PPSV23)  vaccine.** / 1 to 2 doses if you smoke cigarettes or if you have certain conditions.  Meningococcal vaccine.** / 1 dose if you are age 88 to 41 years and a Market researcher living in a residence hall, or have one of several medical conditions, you need to get vaccinated against meningococcal disease. You may also need additional booster doses.  Hepatitis A vaccine.** / Consult your health care provider.  Hepatitis B vaccine.** / Consult your health care provider.  Haemophilus influenzae type b (Hib) vaccine.** / Consult your health care provider. Ages 45 to 64years  Blood pressure check.** / Every 1 to 2 years.  Lipid and cholesterol check.** / Every 5 years beginning at age 2 years.  Lung cancer screening. / Every year if you are aged 10 80 years and have a 30-pack-year history of smoking and currently smoke or have quit within the past 15 years. Yearly screening is stopped once you have quit smoking for at least 15 years or develop a health problem that would prevent you from having lung cancer treatment.  Clinical breast exam.** / Every year after age 28 years.  BRCA-related cancer risk assessment.** / For women who have family members with a BRCA-related cancer (breast, ovarian, tubal, or peritoneal cancers).  Mammogram.** / Every year beginning at age 63 years and continuing for as long as you are in good health. Consult with your health care provider.  Pap test.** / Every 3 years starting at age 71 years through age 39 or 90 years with a history of 3 consecutive normal Pap tests.  HPV screening.** / Every 3 years from ages 27 years through ages 32 to 72 years with a history of 3 consecutive normal Pap tests.  Fecal occult blood test (FOBT) of stool. / Every year beginning at age 40 years and continuing until age 29 years. You may not need to do this test if you get a colonoscopy every 10 years.  Flexible sigmoidoscopy or colonoscopy.** / Every 5 years for a flexible  sigmoidoscopy or every 10 years for a colonoscopy beginning at age 66 years and continuing until age 47 years.  Hepatitis C blood test.** / For all people born from [birth year] through 1967 and any individual with known risks for hepatitis C.  Skin self-exam. / Monthly.  Influenza vaccine. / Every year.  Tetanus, diphtheria, and acellular pertussis (Tdap/Td) vaccine.** / Consult your health care provider. Pregnant women should receive 1 dose of Tdap vaccine during each pregnancy. 1 dose of Td every 10 years.  Varicella vaccine.** / Consult your health care provider. Pregnant females who do not have evidence of immunity should receive the first dose after pregnancy.  Zoster vaccine.** / 1 dose for adults aged 39 years or older.  Measles, mumps, rubella (MMR) vaccine.** / You need at least 1 dose of MMR if you were born in 1959 or later. You may also need a 2nd dose. For females of childbearing age, rubella immunity should be determined. If there is no evidence of immunity, females who are not pregnant should be vaccinated. If there is no evidence of immunity, females who are pregnant should delay immunization until after pregnancy.  Pneumococcal 13-valent conjugate (PCV13) vaccine.** / Consult your health care provider.  Pneumococcal polysaccharide (PPSV23) vaccine.** / 1  to 2 doses if you smoke cigarettes or if you have certain conditions.  Meningococcal vaccine.** / Consult your health care provider.  Hepatitis A vaccine.** / Consult your health care provider.  Hepatitis B vaccine.** / Consult your health care provider.  Haemophilus influenzae type b (Hib) vaccine.** / Consult your health care provider. Ages 66 years and over  Blood pressure check.** / Every 1 to 2 years.  Lipid and cholesterol check.** / Every 5 years beginning at age 50 years.  Lung cancer screening. / Every year if you are aged 65 80 years and have a 30-pack-year history of smoking and currently smoke or have quit  within the past 15 years. Yearly screening is stopped once you have quit smoking for at least 15 years or develop a health problem that would prevent you from having lung cancer treatment.  Clinical breast exam.** / Every year after age 71 years.  BRCA-related cancer risk assessment.** / For women who have family members with a BRCA-related cancer (breast, ovarian, tubal, or peritoneal cancers).  Mammogram.** / Every year beginning at age 58 years and continuing for as long as you are in good health. Consult with your health care provider.  Pap test.** / Every 3 years starting at age 51 years through age 34 or 38 years with 3 consecutive normal Pap tests. Testing can be stopped between 65 and 70 years with 3 consecutive normal Pap tests and no abnormal Pap or HPV tests in the past 10 years.  HPV screening.** / Every 3 years from ages 35 years through ages 84 or 80 years with a history of 3 consecutive normal Pap tests. Testing can be stopped between 65 and 70 years with 3 consecutive normal Pap tests and no abnormal Pap or HPV tests in the past 10 years.  Fecal occult blood test (FOBT) of stool. / Every year beginning at age 26 years and continuing until age 51 years. You may not need to do this test if you get a colonoscopy every 10 years.  Flexible sigmoidoscopy or colonoscopy.** / Every 5 years for a flexible sigmoidoscopy or every 10 years for a colonoscopy beginning at age 55 years and continuing until age 19 years.  Hepatitis C blood test.** / For all people born from [birth year] through 1967 and any individual with known risks for hepatitis C.  Osteoporosis screening.** / A one-time screening for women ages 68 years and over and women at risk for fractures or osteoporosis.  Skin self-exam. / Monthly.  Influenza vaccine. / Every year.  Tetanus, diphtheria, and acellular pertussis (Tdap/Td) vaccine.** / 1 dose of Td every 10 years.  Varicella vaccine.** / Consult your health care  provider.  Zoster vaccine.** / 1 dose for adults aged 21 years or older.  Pneumococcal 13-valent conjugate (PCV13) vaccine.** / Consult your health care provider.  Pneumococcal polysaccharide (PPSV23) vaccine.** / 1 dose for all adults aged 43 years and older.  Meningococcal vaccine.** / Consult your health care provider.  Hepatitis A vaccine.** / Consult your health care provider.  Hepatitis B vaccine.** / Consult your health care provider.  Haemophilus influenzae type b (Hib) vaccine.** / Consult your health care provider. ** Family history and personal history of risk and conditions may change your health care provider's recommendations. Document Released: 12/10/2001 Document Revised: 08/04/2013 Document Reviewed: 03/11/2011 [facility] Patient Information 2014 [place], [place].  Thank you for enrolling in [facility]. Please follow the instructions below to securely access your online medical record. MyChart allows you to send messages  to your doctor, view your test results, manage appointments, and more.   How Do I Sign Up? 1. In your Internet browser, go to [employer] and enter [email or web address]. 2. Click on the Sign Up Now link in the Sign In box. You will see the New Member Sign Up page. 3. Enter your MyChart Access Code exactly as it appears below. You will not need to use this code after you've completed the sign-up process. If you do not sign up before the expiration date, you must request a new code.  MyChart Access Code: [email or web address] Expires: 04/01/2014  1:17 PM  4. Enter your Social Security Number ([email or web address]) and Date of Birth (mm/dd/yyyy) as indicated and click Submit. You will be taken to the next sign-up page. 5. Create a MyChart ID. This will be your MyChart login ID and cannot be changed, so think of one that is secure and easy to remember. 6. Create a MyChart password. You can change your password at any time. 7. Enter your Password Reset Question  and Answer. This can be used at a later time if you forget your password.  8. Enter your e-mail address. You will receive e-mail notification when new information is available in [facility]. 9. Click Sign Up. You can now view your medical record.   Additional Information Remember, MyChart is NOT to be used for urgent needs. For medical emergencies, dial 911.

## 2014-02-02 ENCOUNTER — Encounter (HOSPITAL_COMMUNITY): Payer: Self-pay | Admitting: Emergency Medicine

## 2014-02-02 ENCOUNTER — Emergency Department (HOSPITAL_COMMUNITY)
Admission: EM | Admit: 2014-02-02 | Discharge: 2014-02-02 | Disposition: A | Discharge status: Home / Self Care | Payer: BC Managed Care – PPO | Source: Home / Self Care | Attending: Emergency Medicine | Admitting: Emergency Medicine

## 2014-02-02 DIAGNOSIS — M5432 Sciatica, left side: Secondary | ICD-10-CM

## 2014-02-02 DIAGNOSIS — M5430 Sciatica, unspecified side: Secondary | ICD-10-CM

## 2014-02-02 MED ORDER — METHYLPREDNISOLONE (PAK) 4 MG PO TABS: ORAL_TABLET | ORAL | Status: DC

## 2014-02-02 MED ORDER — HYDROCODONE-ACETAMINOPHEN 5-325 MG PO TABS: 1.0000 | ORAL_TABLET | Freq: Four times a day (QID) | ORAL | Status: DC | PRN

## 2014-02-02 MED ORDER — KETOROLAC TROMETHAMINE 30 MG/ML IJ SOLN
INTRAMUSCULAR | Status: AC
  Filled 2014-02-02: qty 1

## 2014-02-02 MED ORDER — KETOROLAC TROMETHAMINE 30 MG/ML IJ SOLN
60.0000 mg | Freq: Once | INTRAMUSCULAR | Status: AC
  Administered 2014-02-02: 60 mg via INTRAMUSCULAR

## 2014-02-02 NOTE — ED Provider Notes (Signed)
CSN: [id number]     Arrival date & time 02/02/14  1057 History   First MD Initiated Contact with Patient 02/02/14 1314     Chief Complaint  Patient presents with  . Hip Pain   (Consider location/radiation/quality/duration/timing/severity/associated sxs/prior Treatment) HPI Comments: Symptoms aggravated by changes in position, such as transitions from sitting to standing and vice versa.  Patient is a 53 y.o. female presenting with back pain. The history is provided by the patient.  Back Pain Location:  Sacro-iliac joint (Left) Quality:  Shooting ("throbbing") Radiates to:  L foot Pain severity:  Moderate Pain is:  Same all the time Onset quality:  Gradual Duration:  2 days Timing:  Constant Progression:  Worsening Chronicity:  New Context: not emotional stress, not falling, not lifting heavy objects, not MVA, not physical stress, not recent illness and not twisting   Ineffective treatments:  NSAIDs Associated symptoms: no abdominal pain, no abdominal swelling, no bladder incontinence, no bowel incontinence, no dysuria, no fever, no leg pain, no numbness, no paresthesias, no pelvic pain, no perianal numbness, no tingling, no weakness and no weight loss     Past Medical History  Diagnosis Date  . Hiatal hernia   . GERD (gastroesophageal reflux disease)   . Seasonal allergies   . Arthritis     right hand  . Mass of finger of right hand 02/2013    right middle finger  . Sinus congestion 03/16/2013    cough, stuffy and runny nose  . Wears partial dentures     upper  . Urine incontinence   . Chicken pox     Childhood  . Diverticulosis of colon (without mention of hemorrhage)   . Hyperplastic colon polyp    Past Surgical History  Procedure Laterality Date  . Foot surgery Bilateral     exc. ingrown toenail great toe  . Tubal ligation  09/02/2002  . Colonoscopy  08/14/2007    562.10, 211.3   Family History  Problem Relation Age of Onset  . Kidney disease Father   . Heart  disease Father   . Stroke Father   . Hypertension Father   . Heart disease Mother   . Depression Mother   . Colon cancer Neg Hx    History  Substance Use Topics  . Smoking status: Former Research scientist (life sciences)  . Smokeless tobacco: Never Used     Comment: quit smoking 20 years ago  . Alcohol Use: No   OB History   Grav Para Term Preterm Abortions TAB SAB Ect Mult Living   1 1 1       1      Review of Systems  Constitutional: Negative for fever and weight loss.  Gastrointestinal: Negative for abdominal pain and bowel incontinence.  Genitourinary: Negative for bladder incontinence, dysuria and pelvic pain.  Musculoskeletal: Positive for back pain.  Neurological: Negative for tingling, weakness, numbness and paresthesias.  All other systems reviewed and are negative.   Allergies  Shellfish allergy  Home Medications   Current Outpatient Rx  Name  Route  Sig  Dispense  Refill  . Dexlansoprazole (DEXILANT) 30 MG capsule   Oral   Take 1 capsule (30 mg total) by mouth daily.   30 capsule   3   . HYDROcodone-acetaminophen (NORCO/VICODIN) 5-325 MG per tablet   Oral   Take 1-2 tablets by mouth every 6 (six) hours as needed for moderate pain.   12 tablet   0   . methylPREDNIsolone (MEDROL DOSPACK) 4 MG tablet  follow package directions   21 tablet   0   . pramoxine-hydrocortisone (ANALPRAM HC) cream   Topical   Apply topically as needed.   30 g   1    BP 127/79  Pulse 69  Temp(Src) 97.8 F (36.6 C) (Oral)  Resp 21  SpO2 99% Physical Exam  Nursing note and vitals reviewed. Constitutional: She is oriented to person, place, and time. She appears well-developed and well-nourished. No distress.  HENT:  Head: Normocephalic and atraumatic.  Eyes: Conjunctivae are normal. No scleral icterus.  Neck: Normal range of motion. Neck supple.  Cardiovascular: Normal rate, regular rhythm and normal heart sounds.   Pulmonary/Chest: Effort normal and breath sounds normal.  Abdominal:  Soft. Bowel sounds are normal. She exhibits no distension. There is no tenderness.  Musculoskeletal: Normal range of motion.       Lumbar back: She exhibits tenderness. She exhibits normal range of motion, no bony tenderness, no swelling, no edema, no deformity, no laceration, no pain, no spasm and normal pulse.       Back:  CSM exam of LLE normal. DTRs normal.   Neurological: She is alert and oriented to person, place, and time.  Skin: Skin is warm and dry. No rash noted.  Psychiatric: She has a normal mood and affect. Her behavior is normal.    ED Course  Procedures (including critical care time) Labs Review Labs Reviewed - No data to display Imaging Review No results found.   MDM   1. Sciatica of left side      [place], [place] 02/02/14 1359

## 2014-02-02 NOTE — ED Notes (Signed)
Pt  Reports   Pain  l  Hip     With  Pain  Radiating  Down the  l  Leg        Symptoms  X  2  Days     Pain is  Worse  On  Weight bearing         Pt  denys  A  Recent  specefic  Injury

## 2014-02-02 NOTE — Discharge Instructions (Signed)
Sciatica °Sciatica is pain, weakness, numbness, or tingling along the path of the sciatic nerve. The nerve starts in the lower back and runs down the back of each leg. The nerve controls the muscles in the lower leg and in the back of the knee, while also providing sensation to the back of the thigh, lower leg, and the sole of your foot. Sciatica is a symptom of another medical condition. For instance, nerve damage or certain conditions, such as a herniated disk or bone spur on the spine, pinch or put pressure on the sciatic nerve. This causes the pain, weakness, or other sensations normally associated with sciatica. Generally, sciatica only affects one side of the body. °CAUSES  °· Herniated or slipped disc. °· Degenerative disk disease. °· A pain disorder involving the narrow muscle in the buttocks (piriformis syndrome). °· Pelvic injury or fracture. °· Pregnancy. °· Tumor (rare). °SYMPTOMS  °Symptoms can vary from mild to very severe. The symptoms usually travel from the low back to the buttocks and down the back of the leg. Symptoms can include: °· Mild tingling or dull aches in the lower back, leg, or hip. °· Numbness in the back of the calf or sole of the foot. °· Burning sensations in the lower back, leg, or hip. °· Sharp pains in the lower back, leg, or hip. °· Leg weakness. °· Severe back pain inhibiting movement. °These symptoms may get worse with coughing, sneezing, laughing, or prolonged sitting or standing. Also, being overweight may worsen symptoms. °DIAGNOSIS  °Your caregiver will perform a physical exam to look for common symptoms of sciatica. He or she may ask you to do certain movements or activities that would trigger sciatic nerve pain. Other tests may be performed to find the cause of the sciatica. These may include: °· Blood tests. °· X-rays. °· Imaging tests, such as an MRI or CT scan. °TREATMENT  °Treatment is directed at the cause of the sciatic pain. Sometimes, treatment is not necessary  and the pain and discomfort goes away on its own. If treatment is needed, your caregiver may suggest: °· Over-the-counter medicines to relieve pain. °· Prescription medicines, such as anti-inflammatory medicine, muscle relaxants, or narcotics. °· Applying heat or ice to the painful area. °· Steroid injections to lessen pain, irritation, and inflammation around the nerve. °· Reducing activity during periods of pain. °· Exercising and stretching to strengthen your abdomen and improve flexibility of your spine. Your caregiver may suggest losing weight if the extra weight makes the back pain worse. °· Physical therapy. °· Surgery to eliminate what is pressing or pinching the nerve, such as a bone spur or part of a herniated disk. °HOME CARE INSTRUCTIONS  °· Only take over-the-counter or prescription medicines for pain or discomfort as directed by your caregiver. °· Apply ice to the affected area for 20 minutes, 3 4 times a day for the first 48 72 hours. Then try heat in the same way. °· Exercise, stretch, or perform your usual activities if these do not aggravate your pain. °· Attend physical therapy sessions as directed by your caregiver. °· Keep all follow-up appointments as directed by your caregiver. °· Do not wear high heels or shoes that do not provide proper support. °· Check your mattress to see if it is too soft. A firm mattress may lessen your pain and discomfort. °SEEK IMMEDIATE MEDICAL CARE IF:  °· You lose control of your bowel or bladder (incontinence). °· You have increasing weakness in the lower back,   pelvis, buttocks, or legs. °· You have redness or swelling of your back. °· You have a burning sensation when you urinate. °· You have pain that gets worse when you lie down or awakens you at night. °· Your pain is worse than you have experienced in the past. °· Your pain is lasting longer than 4 weeks. °· You are suddenly losing weight without reason. °MAKE SURE YOU: °· Understand these  instructions. °· Will watch your condition. °· Will get help right away if you are not doing well or get worse. °Document Released: 10/08/2001 Document Revised: 04/14/2012 Document Reviewed: 02/23/2012 °[employer]® Patient Information ©2014 [employer]. ° °

## 2014-02-03 NOTE — ED Provider Notes (Signed)
Medical screening examination/treatment/procedure(s) were performed by non-physician practitioner and as supervising physician I was immediately available for consultation/collaboration.  Philipp Deputy, M.D.  Harden Mo, MD 02/03/14 [phone]

## 2014-02-09 ENCOUNTER — Ambulatory Visit (INDEPENDENT_AMBULATORY_CARE_PROVIDER_SITE_OTHER): Payer: BC Managed Care – PPO | Admitting: Internal Medicine

## 2014-02-09 ENCOUNTER — Encounter: Payer: Self-pay | Admitting: Internal Medicine

## 2014-02-09 VITALS — BP 138/82 | HR 74 | Temp 97.4°F | Resp 16 | Ht 61.5 in | Wt 206.0 lb

## 2014-02-09 DIAGNOSIS — M5432 Sciatica, left side: Secondary | ICD-10-CM | POA: Insufficient documentation

## 2014-02-09 DIAGNOSIS — M5430 Sciatica, unspecified side: Secondary | ICD-10-CM

## 2014-02-09 MED ORDER — METHOCARBAMOL 500 MG PO TABS: 500.0000 mg | ORAL_TABLET | Freq: Three times a day (TID) | ORAL | Status: DC

## 2014-02-09 MED ORDER — MELOXICAM 15 MG PO TABS: 15.0000 mg | ORAL_TABLET | Freq: Every day | ORAL | Status: DC

## 2014-02-09 NOTE — Progress Notes (Signed)
Pre visit review using our clinic review tool, if applicable. No additional management support is needed unless otherwise documented below in the visit note. 

## 2014-02-09 NOTE — Progress Notes (Signed)
Subjective:    Patient ID: Marie Torres, female    DOB: [birth year], 53 y.o.   MRN: [id number]  HPI Comments: New to me she complains of pain in her left buttocks area, she has had this intermittently for 3-4 years. The initial episode occurred after she did some pushing at work many years ago. She has had several flare ups since then. She has been treated in the past with meds and PT and has had a good response. This most recent episode started about 1 week after she did some heavy activity at work but she does not recall any specific trauma or injury. She describes the pain as sharp and stabbing with a fire and burning sensation in the left leg. She was seen in the ER 5 days ago and has taken steroids, norco, and nsaids with a moderate amount of relief from the pain.     Review of Systems  Constitutional: Negative.  Negative for fever, chills, diaphoresis, appetite change and fatigue.  HENT: Negative.   Eyes: Negative.   Respiratory: Negative.  Negative for cough, choking, chest tightness, shortness of breath and stridor.   Cardiovascular: Negative.  Negative for chest pain, palpitations and leg swelling.  Gastrointestinal: Negative.  Negative for nausea, vomiting, abdominal pain, diarrhea, constipation and blood in stool.  Endocrine: Negative.   Genitourinary: Negative.   Musculoskeletal: Positive for back pain. Negative for arthralgias, gait problem, joint swelling, myalgias, neck pain and neck stiffness.  Skin: Negative.   Allergic/Immunologic: Negative.   Neurological: Negative.  Negative for dizziness, weakness, light-headedness and numbness.  Hematological: Negative.  Negative for adenopathy. Does not bruise/bleed easily.  Psychiatric/Behavioral: Negative.        Objective:   Physical Exam  Vitals reviewed. Constitutional: She appears well-developed and well-nourished. No distress.  HENT:  Head: Normocephalic and atraumatic.  Mouth/Throat: Oropharynx is clear and moist. No  oropharyngeal exudate.  Eyes: Conjunctivae are normal. Right eye exhibits no discharge. Left eye exhibits no discharge. No scleral icterus.  Neck: Normal range of motion. Neck supple. No JVD present. No tracheal deviation present. No thyromegaly present.  Cardiovascular: Normal rate, regular rhythm, normal heart sounds and intact distal pulses.  Exam reveals no gallop and no friction rub.   No murmur heard. Pulmonary/Chest: Effort normal and breath sounds normal. No stridor. No respiratory distress. She has no wheezes. She has no rales. She exhibits no tenderness.  Abdominal: Soft. Bowel sounds are normal. She exhibits no distension and no mass. There is no tenderness. There is no rebound and no guarding.  Musculoskeletal: Normal range of motion. She exhibits no edema and no tenderness.       Left hip: Normal. She exhibits normal range of motion, normal strength, no tenderness, no bony tenderness, no swelling, no crepitus, no deformity and no laceration.       Lumbar back: Normal. She exhibits normal range of motion, no tenderness, no bony tenderness, no swelling, no edema, no deformity, no laceration, no pain, no spasm and normal pulse.  Lymphadenopathy:    She has no cervical adenopathy.  Neurological: She is alert. She has normal strength. She displays no atrophy, no tremor and normal reflexes. No cranial nerve deficit or sensory deficit. She exhibits normal muscle tone. She displays a negative Romberg sign. She displays no seizure activity. Coordination and gait normal. She displays no Babinski's sign on the right side. She displays no Babinski's sign on the left side.  Reflex Scores:      Tricep reflexes are  1+ on the right side and 1+ on the left side.      Bicep reflexes are 1+ on the right side and 1+ on the left side.      Brachioradialis reflexes are 1+ on the right side and 1+ on the left side.      Patellar reflexes are 2+ on the right side and 2+ on the left side.      Achilles  reflexes are 1+ on the right side and 1+ on the left side. Neg SLR in BLE  Skin: Skin is warm and dry. No rash noted. She is not diaphoretic. No erythema. No pallor.  Psychiatric: She has a normal mood and affect. Her behavior is normal. Judgment and thought content normal.     Lab Results  Component Value Date   WBC 7.6 11/30/2013   HGB 13.9 11/30/2013   HCT 42.6 11/30/2013   PLT 335.0 11/30/2013   GLUCOSE 95 11/30/2013   CHOL 216* 11/30/2013   TRIG 128.0 11/30/2013   HDL 43.40 11/30/2013   LDLDIRECT 150.8 11/30/2013   ALT 20 11/30/2013   AST 22 11/30/2013   NA 141 11/30/2013   K 4.4 11/30/2013   CL 107 11/30/2013   CREATININE 0.9 11/30/2013   BUN 14 11/30/2013   CO2 26 11/30/2013   TSH 0.58 11/30/2013       Assessment & Plan:

## 2014-02-09 NOTE — Patient Instructions (Signed)
Sciatica °Sciatica is pain, weakness, numbness, or tingling along the path of the sciatic nerve. The nerve starts in the lower back and runs down the back of each leg. The nerve controls the muscles in the lower leg and in the back of the knee, while also providing sensation to the back of the thigh, lower leg, and the sole of your foot. Sciatica is a symptom of another medical condition. For instance, nerve damage or certain conditions, such as a herniated disk or bone spur on the spine, pinch or put pressure on the sciatic nerve. This causes the pain, weakness, or other sensations normally associated with sciatica. Generally, sciatica only affects one side of the body. °CAUSES  °· Herniated or slipped disc. °· Degenerative disk disease. °· A pain disorder involving the narrow muscle in the buttocks (piriformis syndrome). °· Pelvic injury or fracture. °· Pregnancy. °· Tumor (rare). °SYMPTOMS  °Symptoms can vary from mild to very severe. The symptoms usually travel from the low back to the buttocks and down the back of the leg. Symptoms can include: °· Mild tingling or dull aches in the lower back, leg, or hip. °· Numbness in the back of the calf or sole of the foot. °· Burning sensations in the lower back, leg, or hip. °· Sharp pains in the lower back, leg, or hip. °· Leg weakness. °· Severe back pain inhibiting movement. °These symptoms may get worse with coughing, sneezing, laughing, or prolonged sitting or standing. Also, being overweight may worsen symptoms. °DIAGNOSIS  °Your caregiver will perform a physical exam to look for common symptoms of sciatica. He or she may ask you to do certain movements or activities that would trigger sciatic nerve pain. Other tests may be performed to find the cause of the sciatica. These may include: °· Blood tests. °· X-rays. °· Imaging tests, such as an MRI or CT scan. °TREATMENT  °Treatment is directed at the cause of the sciatic pain. Sometimes, treatment is not necessary  and the pain and discomfort goes away on its own. If treatment is needed, your caregiver may suggest: °· Over-the-counter medicines to relieve pain. °· Prescription medicines, such as anti-inflammatory medicine, muscle relaxants, or narcotics. °· Applying heat or ice to the painful area. °· Steroid injections to lessen pain, irritation, and inflammation around the nerve. °· Reducing activity during periods of pain. °· Exercising and stretching to strengthen your abdomen and improve flexibility of your spine. Your caregiver may suggest losing weight if the extra weight makes the back pain worse. °· Physical therapy. °· Surgery to eliminate what is pressing or pinching the nerve, such as a bone spur or part of a herniated disk. °HOME CARE INSTRUCTIONS  °· Only take over-the-counter or prescription medicines for pain or discomfort as directed by your caregiver. °· Apply ice to the affected area for 20 minutes, 3 4 times a day for the first 48 72 hours. Then try heat in the same way. °· Exercise, stretch, or perform your usual activities if these do not aggravate your pain. °· Attend physical therapy sessions as directed by your caregiver. °· Keep all follow-up appointments as directed by your caregiver. °· Do not wear high heels or shoes that do not provide proper support. °· Check your mattress to see if it is too soft. A firm mattress may lessen your pain and discomfort. °SEEK IMMEDIATE MEDICAL CARE IF:  °· You lose control of your bowel or bladder (incontinence). °· You have increasing weakness in the lower back,   pelvis, buttocks, or legs. °· You have redness or swelling of your back. °· You have a burning sensation when you urinate. °· You have pain that gets worse when you lie down or awakens you at night. °· Your pain is worse than you have experienced in the past. °· Your pain is lasting longer than 4 weeks. °· You are suddenly losing weight without reason. °MAKE SURE YOU: °· Understand these  instructions. °· Will watch your condition. °· Will get help right away if you are not doing well or get worse. °Document Released: 10/08/2001 Document Revised: 04/14/2012 Document Reviewed: 02/23/2012 °[employer]® Patient Information ©2014 [employer]. ° °

## 2014-02-11 ENCOUNTER — Encounter: Payer: Self-pay | Admitting: Internal Medicine

## 2014-02-11 NOTE — Assessment & Plan Note (Signed)
She will try robaxin and mobic for the pain and will cont norco as needed for the more severe pain I have asked her to start PT She wants FMLA paperwork completed

## 2014-02-24 ENCOUNTER — Emergency Department (HOSPITAL_COMMUNITY)
Admission: EM | Admit: 2014-02-24 | Discharge: 2014-02-24 | Disposition: A | Discharge status: Home / Self Care | Payer: BC Managed Care – PPO | Source: Home / Self Care | Attending: Emergency Medicine | Admitting: Emergency Medicine

## 2014-02-24 ENCOUNTER — Encounter (HOSPITAL_COMMUNITY): Payer: Self-pay | Admitting: Emergency Medicine

## 2014-02-24 ENCOUNTER — Emergency Department (INDEPENDENT_AMBULATORY_CARE_PROVIDER_SITE_OTHER): Payer: BC Managed Care – PPO

## 2014-02-24 DIAGNOSIS — J209 Acute bronchitis, unspecified: Secondary | ICD-10-CM

## 2014-02-24 MED ORDER — AZITHROMYCIN 250 MG PO TABS: ORAL_TABLET | ORAL | Status: DC

## 2014-02-24 MED ORDER — HYDROCOD POLST-CHLORPHEN POLST 10-8 MG/5ML PO LQCR: 5.0000 mL | Freq: Two times a day (BID) | ORAL | Status: DC | PRN

## 2014-02-24 MED ORDER — ALBUTEROL SULFATE HFA 108 (90 BASE) MCG/ACT IN AERS: 2.0000 | INHALATION_SPRAY | Freq: Four times a day (QID) | RESPIRATORY_TRACT | Status: DC

## 2014-02-24 MED ORDER — PREDNISONE 20 MG PO TABS: 20.0000 mg | ORAL_TABLET | Freq: Two times a day (BID) | ORAL | Status: DC

## 2014-02-24 NOTE — ED Notes (Signed)
C/o cough for a week  States she has a bad cough, nasal drainage and congestion States nyquil was taken as tx States sx started off as a sore throat.  Throat feels better now

## 2014-02-24 NOTE — Discharge Instructions (Signed)
How to Use an Inhaler Proper inhaler technique is very important. Good technique ensures that the medicine reaches the lungs. Poor technique results in depositing the medicine on the tongue and back of the throat rather than in the airways. If you do not use the inhaler with good technique, the medicine will not help you. STEPS TO FOLLOW IF USING AN INHALER WITHOUT AN EXTENSION TUBE 1. Remove the cap from the inhaler. 2. If you are using the inhaler for the first time, you will need to prime it. Shake the inhaler for 5 seconds and release four puffs into the air, away from your face. Ask your health care provider or pharmacist if you have questions about priming your inhaler. 3. Shake the inhaler for 5 seconds before each breath in (inhalation). 4. Position the inhaler so that the top of the canister faces up. 5. Put your index finger on the top of the medicine canister. Your thumb supports the bottom of the inhaler. 6. Open your mouth. 7. Either place the inhaler between your teeth and place your lips tightly around the mouthpiece, or hold the inhaler 1 2 inches away from your open mouth. If you are unsure of which technique to use, ask your health care provider. 8. Breathe out (exhale) normally and as completely as possible. 9. Press the canister down with your index finger to release the medicine. 10. At the same time as the canister is pressed, inhale deeply and slowly until your lungs are completely filled. This should take 4 6 seconds. Keep your tongue down. 11. Hold the medicine in your lungs for 5 10 seconds (10 seconds is best). This helps the medicine get into the small airways of your lungs. 12. Breathe out slowly, through pursed lips. Whistling is an example of pursed lips. 13. Wait at least 15 30 seconds between puffs. Continue with the above steps until you have taken the number of puffs your health care provider has ordered. Do not use the inhaler more than your health care provider  tells you. 14. Replace the cap on the inhaler. 15. Follow the directions from your health care provider or the inhaler insert for cleaning the inhaler. STEPS TO FOLLOW IF USING AN INHALER WITH AN EXTENSION (SPACER) 1. Remove the cap from the inhaler. 2. If you are using the inhaler for the first time, you will need to prime it. Shake the inhaler for 5 seconds and release four puffs into the air, away from your face. Ask your health care provider or pharmacist if you have questions about priming your inhaler. 3. Shake the inhaler for 5 seconds before each breath in (inhalation). 4. Place the open end of the spacer onto the mouthpiece of the inhaler. 5. Position the inhaler so that the top of the canister faces up and the spacer mouthpiece faces you. 6. Put your index finger on the top of the medicine canister. Your thumb supports the bottom of the inhaler and the spacer. 7. Breathe out (exhale) normally and as completely as possible. 8. Immediately after exhaling, place the spacer between your teeth and into your mouth. Close your lips tightly around the spacer. 9. Press the canister down with your index finger to release the medicine. 10. At the same time as the canister is pressed, inhale deeply and slowly until your lungs are completely filled. This should take 4 6 seconds. Keep your tongue down and out of the way. 11. Hold the medicine in your lungs for 5 10 seconds (10  seconds is best). This helps the medicine get into the small airways of your lungs. Exhale. 12. Repeat inhaling deeply through the spacer mouthpiece. Again hold that breath for up to 10 seconds (10 seconds is best). Exhale slowly. If it is difficult to take this second deep breath through the spacer, breathe normally several times through the spacer. Remove the spacer from your mouth. 13. Wait at least 15 30 seconds between puffs. Continue with the above steps until you have taken the number of puffs your health care provider has  ordered. Do not use the inhaler more than your health care provider tells you. 14. Remove the spacer from the inhaler, and place the cap on the inhaler. 15. Follow the directions from your health care provider or the inhaler insert for cleaning the inhaler and spacer. If you are using different kinds of inhalers, use your quick relief medicine to open the airways 10 15 minutes before using a steroid if instructed to do so by your health care provider. If you are unsure which inhalers to use and the order of using them, ask your health care provider, nurse, or respiratory therapist. If you are using a steroid inhaler, always rinse your mouth with water after your last puff, then gargle and spit out the water. Do not swallow the water. AVOID:  Inhaling before or after starting the spray of medicine. It takes practice to coordinate your breathing with triggering the spray.  Inhaling through the nose (rather than the mouth) when triggering the spray. HOW TO DETERMINE IF YOUR INHALER IS FULL OR NEARLY EMPTY You cannot know when an inhaler is empty by shaking it. A few inhalers are now being made with dose counters. Ask your health care provider for a prescription that has a dose counter if you feel you need that extra help. If your inhaler does not have a counter, ask your health care provider to help you determine the date you need to refill your inhaler. Write the refill date on a calendar or your inhaler canister. Refill your inhaler 7 10 days before it runs out. Be sure to keep an adequate supply of medicine. This includes making sure it is not expired, and that you have a spare inhaler.  SEEK MEDICAL CARE IF:   Your symptoms are only partially relieved with your inhaler.  You are having trouble using your inhaler.  You have some increase in phlegm. SEEK IMMEDIATE MEDICAL CARE IF:   You feel little or no relief with your inhalers. You are still wheezing and are feeling shortness of breath or  tightness in your chest or both.  You have dizziness, headaches, or a fast heart rate.  You have chills, fever, or night sweats.  You have a noticeable increase in phlegm production, or there is blood in the phlegm. MAKE SURE YOU:   Understand these instructions.  Will watch your condition.  Will get help right away if you are not doing well or get worse. Document Released: 10/11/2000 Document Revised: 08/04/2013 Document Reviewed: 05/13/2013 [facility] Patient Information 2014 [place], [place].  Most upper respiratory infections are caused by viruses and do not require antibiotics.  We try to save the antibiotics for when we really need them to prevent bacteria from developing resistance to them.  Here are a few hints about things that can be done at home to help get over an upper respiratory infection quicker:  Get extra sleep and extra fluids.  Get 7 to 9 hours of sleep per  night and 6 to 8 glasses of water a day.  Getting extra sleep keeps the immune system from getting run down.  Most people with an upper respiratory infection are a little dehydrated.  The extra fluids also keep the secretions liquified and easier to deal with.  Also, get extra vitamin C.  4000 mg per day is the recommended dose. For the aches, headache, and fever, acetaminophen or ibuprofen are helpful.  These can be alternated every 4 hours.  People with liver disease should avoid large amounts of acetaminophen, and people with ulcer disease, gastroesophageal reflux, gastritis, congestive heart failure, chronic kidney disease, coronary artery disease and the elderly should avoid ibuprofen. For nasal congestion try Mucinex-D, or if you're having lots of sneezing or clear nasal drainage use Zyrtec-D. People with high blood pressure can take these if their blood pressure is controlled, if not, it's best to avoid the forms with a "D" (decongestants).  You can use the plain Mucinex, Allegra, Claritin, or Zyrtec even if your blood  pressure is not controlled.   A Saline nasal spray such as Ocean Spray can also help.  You can add a decongestant sprays such as Afrin, but you should not use the decongestant sprays for more than 3 or 4 days since they can be habituating.  Breathe Rite nasal strips can also offer a non-drug alternative treatment to nasal congestion, especially at night. For people with symptoms of sinusitis, sleeping with your head elevated can be helpful.  For sinus pain, moist, hot compresses to the face may provide some relief.  Many people find that inhaling steam as in a shower or from a pot of steaming water can help. For any viral infection, zinc containing lozenges such as Cold-Eze or Zicam are helpful.  Zinc helps to fight viral infection.  Hot salt water gargles (8 oz of hot water, 1/2 tsp of table salt, and a pinch of baking soda) can give relief as well as hot beverages such as hot tea.  Sucrets extra strength lozenges will help the sore throat.  For the cough, take Delsym 2 tsp every 12 hours.  It has also been found recently that Aleve can help control a cough.  The dose is 1 to 2 tablets twice daily with food.  This can be combined with Delsym. (Note, if you are taking ibuprofen, you should not take Aleve as well--take one or the other.) A cool mist vaporizer will help keep your mucous membranes from drying out.   It's important when you have an upper respiratory infection not to pass the infection to others.  This involves being very careful about the following:  Frequent hand washing or use of hand sanitizer, especially after coughing, sneezing, blowing your nose or touching your face, nose or eyes. Do not shake hands or touch anyone and try to avoid touching surfaces that other people use such as doorknobs, shopping carts, telephones and computer keyboards. Use tissues and dispose of them properly in a garbage can or ziplock bag. Cough into your sleeve. Do not let others eat or drink after  you.  It's also important to recognize the signs of serious illness and get evaluated if they occur: Any respiratory infection that lasts more than 7 to 10 days.  Yellow nasal drainage and sputum are not reliable indicators of a bacterial infection, but if they last for more than 1 week, see your doctor. Fever and sore throat can indicate strep. Fever and cough can indicate influenza or pneumonia.  Any kind of severe symptom such as difficulty breathing, intractable vomiting, or severe pain should prompt you to see a doctor as soon as possible.   Your body's immune system is really the thing that will get rid of this infection.  Your immune system is comprised of 2 types of specialized cells called T cells and B cells.  T cells coordinate the array of cells in your body that engulf invading bacteria or viruses while B cells orchestrate the production of antibodies that neutralize infection.  Anything we do or any medications we give you, will just strengthen your immune system or help it clear up the infection quicker.  Here are a few helpful hints to improve your immune system to help overcome this illness or to prevent future infections:  A few vitamins can improve the health of your immune system.  That's why your diet should include plenty of fruits, vegetables, fish, nuts, and whole grains.  Vitamin A and bet-carotene can increase the cells that fight infections (T cells and B cells).  Vitamin A is abundant in dark greens and orange vegetables such as spinach, greens, sweet potatoes, and carrots.  Vitamin B6 contributes to the maturation of white blood cells, the cells that fight disease.  Foods with vitamin B6 include cold cereal and bananas.  Vitamin C is credited with preventing colds because it increases white blood cells and also prevents cellular damage.  Citrus fruits, peaches and green and red bell peppers are all hight in vitamin C.  Vitamin E is an anti-oxidant that encourages the  production of natural killer cells which reject foreign invaders and B cells that produce antibodies.  Foods high in vitamin E include wheat germ, nuts and seeds.  Foods high in omega-3 fatty acids found in foods like salmon, tuna and mackerel boost your immune system and help cells to engulf and absorb germs.  Probiotics are good bacteria that increase your T cells.  These can be found in yogurt and are available in supplements such as Culturelle or Align.  Moderate exercise increases the strength of your immune system and your ability to recover from illness.  I suggest 3 to 5 moderate intensity 30 minute workouts per week.    Sleep is another component of maintaining a strong immune system.  It enables your body to recuperate from the day's activities, stress and work.  My recommendation is to get between 7 and 9 hours of sleep per night.  If you smoke, try to quit completely or at least cut down.  Drink alcohol only in moderation if at all.  No more than 2 drinks daily for men or 1 for women.  Get a flu vaccine early in the fall or if you have not gotten one yet, once this illness has run its course.  If you are over 65, a smoker, or an asthmatic, get a pneumococcal vaccine.  My final recommendation is to maintain a healthy weight.  Excess weight can impair the immune system by interfering with the way the immune system deals with invading viruses or bacteria.    Bronchitis Bronchitis is inflammation of the airways that extend from the windpipe into the lungs (bronchi). The inflammation often causes mucus to develop, which leads to a cough. If the inflammation becomes severe, it may cause shortness of breath. CAUSES  Bronchitis may be caused by:   Viral infections.   Bacteria.   Cigarette smoke.   Allergens, pollutants, and other irritants.  SIGNS AND SYMPTOMS  The most common symptom of bronchitis is a frequent cough that produces mucus. Other symptoms include:  Fever.    Body aches.   Chest congestion.   Chills.   Shortness of breath.   Sore throat.  DIAGNOSIS  Bronchitis is usually diagnosed through a medical history and physical exam. Tests, such as chest X-rays, are sometimes done to rule out other conditions.  TREATMENT  You may need to avoid contact with whatever caused the problem (smoking, for example). Medicines are sometimes needed. These may include:  Antibiotics. These may be prescribed if the condition is caused by bacteria.  Cough suppressants. These may be prescribed for relief of cough symptoms.   Inhaled medicines. These may be prescribed to help open your airways and make it easier for you to breathe.   Steroid medicines. These may be prescribed for those with recurrent (chronic) bronchitis. HOME CARE INSTRUCTIONS  Get plenty of rest.   Drink enough fluids to keep your urine clear or pale yellow (unless you have a medical condition that requires fluid restriction). Increasing fluids may help thin your secretions and will prevent dehydration.   Only take over-the-counter or prescription medicines as directed by your health care provider.  Only take antibiotics as directed. Make sure you finish them even if you start to feel better.  Avoid secondhand smoke, irritating chemicals, and strong fumes. These will make bronchitis worse. If you are a smoker, quit smoking. Consider using nicotine gum or skin patches to help control withdrawal symptoms. Quitting smoking will help your lungs heal faster.   Put a cool-mist humidifier in your bedroom at night to moisten the air. This may help loosen mucus. Change the water in the humidifier daily. You can also run the hot water in your shower and sit in the bathroom with the door closed for 5 10 minutes.   Follow up with your health care provider as directed.   Wash your hands frequently to avoid catching bronchitis again or spreading an infection to others.  SEEK MEDICAL  CARE IF: Your symptoms do not improve after 1 week of treatment.  SEEK IMMEDIATE MEDICAL CARE IF:  Your fever increases.  You have chills.   You have chest pain.   You have worsening shortness of breath.   You have bloody sputum.  You faint.  You have lightheadedness.  You have a severe headache.   You vomit repeatedly. MAKE SURE YOU:   Understand these instructions.  Will watch your condition.  Will get help right away if you are not doing well or get worse. Document Released: 10/14/2005 Document Revised: 08/04/2013 Document Reviewed: 06/08/2013 [facility] Patient Information 2014 [place].

## 2014-02-24 NOTE — ED Provider Notes (Signed)
Chief Complaint   Chief Complaint  Patient presents with  . Cough    History of Present Illness   Marie Torres is a 19 your old female who has had a two-week history of cough productive yellow sputum, chest tightness, wheezing, aching in her ribs when she coughs, nasal congestion with no drainage, headache, sinus pressure, popping of the ears, chills, and diarrhea. She denies any fever, abdominal pain, nausea, or vomiting. She has not had any sore throat. She denies any sick exposures.  Review of Systems   Other than as noted above, the patient denies any of the following symptoms: Systemic:  No fevers, chills, sweats, or myalgias. Eye:  No redness or discharge. ENT:  No ear pain, headache, nasal congestion, drainage, sinus pressure, or sore throat. Neck:  No neck pain, stiffness, or swollen glands. Lungs:  No cough, sputum production, hemoptysis, wheezing, chest tightness, shortness of breath or chest pain. GI:  No abdominal pain, nausea, vomiting or diarrhea.  [place]   Past medical history, family history, social history, meds, and allergies were reviewed. She has a history of GERD and sciatica and takes Kinnelon.  Physical exam   Vital signs:  BP 131/81  Pulse 86  Temp(Src) 99.2 F (37.3 C) (Oral)  Resp 16  SpO2 99% General:  Alert and oriented.  In no distress.  Skin warm and dry. Eye:  No conjunctival injection or drainage. Lids were normal. ENT:  TMs and canals were normal, without erythema or inflammation.  Nasal mucosa was clear and uncongested, without drainage.  Mucous membranes were moist.  Pharynx was clear with no exudate or drainage.  There were no oral ulcerations or lesions. Neck:  Supple, no adenopathy, tenderness or mass. Lungs:  No respiratory distress.  She has expiratory wheezes on the right side anteriorly and some rales, no rhonchi.  Heart:  Regular rhythm, without gallops, murmers or rubs. Skin:  Clear, warm, and dry, without rash or lesions.    Radiology   Dg Chest 2 View  02/24/2014   CLINICAL DATA:  Productive cough for 2 weeks  EXAM: CHEST  2 VIEW  COMPARISON:  Chest radiograph 06/21/2013  FINDINGS: The heart size and mediastinal contours are within normal limits. Both lungs are clear. The visualized skeletal structures are unremarkable.  IMPRESSION: No active cardiopulmonary disease.   Electronically Signed   By: Curlene Dolphin M.D.   On: 02/24/2014 19:00   Assessment     The encounter diagnosis was Acute bronchitis.  No evidence of pneumonia.  Plan    1.  Meds:  The following meds were prescribed:   Discharge Medication List as of 02/24/2014  7:18 PM    START taking these medications   Details  albuterol (PROVENTIL HFA;VENTOLIN HFA) 108 (90 BASE) MCG/ACT inhaler Inhale 2 puffs into the lungs 4 (four) times daily., Starting 02/24/2014, Until Discontinued, Normal    azithromycin (ZITHROMAX Z-PAK) 250 MG tablet Take as directed., Normal    chlorpheniramine-HYDROcodone (TUSSIONEX) 10-8 MG/5ML LQCR Take 5 mLs by mouth every 12 (twelve) hours as needed for cough., Starting 02/24/2014, Until Discontinued, Normal    predniSONE (DELTASONE) 20 MG tablet Take 1 tablet (20 mg total) by mouth 2 (two) times daily., Starting 02/24/2014, Until Discontinued, Normal        2.  Patient Education/Counseling:  The patient was given appropriate handouts, self care instructions, and instructed in symptomatic relief.  Instructed to get extra fluids, rest, and use a cool mist vaporizer.    3.  Follow  up:  The patient was told to follow up here if no better in 3 to 4 days, or sooner if becoming worse in any way, and given some red flag symptoms such as increasing fever, difficulty breathing, chest pain, or persistent vomiting which would prompt immediate return.  Follow up here as needed.      Harden Mo, MD 02/24/14 [phone]

## 2014-03-08 ENCOUNTER — Encounter (HOSPITAL_COMMUNITY): Payer: Self-pay | Admitting: Emergency Medicine

## 2014-03-08 ENCOUNTER — Emergency Department (HOSPITAL_COMMUNITY)
Admission: EM | Admit: 2014-03-08 | Discharge: 2014-03-08 | Disposition: A | Discharge status: Home / Self Care | Payer: BC Managed Care – PPO | Source: Home / Self Care | Attending: Family Medicine | Admitting: Family Medicine

## 2014-03-08 DIAGNOSIS — H6980 Other specified disorders of Eustachian tube, unspecified ear: Secondary | ICD-10-CM

## 2014-03-08 MED ORDER — FLUTICASONE PROPIONATE 50 MCG/ACT NA SUSP: 1.0000 | Freq: Two times a day (BID) | NASAL | Status: DC

## 2014-03-08 MED ORDER — CETIRIZINE HCL 10 MG PO TABS: 10.0000 mg | ORAL_TABLET | Freq: Every day | ORAL | Status: DC

## 2014-03-08 NOTE — Discharge Instructions (Signed)
Drink plenty of fluids as discussed, use medicine as prescribed, and mucinex . Return or see your doctor if further problems

## 2014-03-08 NOTE — ED Notes (Signed)
Pt  Seen  Last   Week  For  Ear problems      Reports  4  Days  Ago  She  Developed a  Sensation of  Roaring  And  Fullness  Of  Both  Ears   - pt  Is  Sitting  uprightbon  The  Exam table  And  Is  In no acute  Distress

## 2014-03-08 NOTE — ED Provider Notes (Signed)
CSN: [id number]     Arrival date & time 03/08/14  0802 History   First MD Initiated Contact with Patient 03/08/14 [phone]     Chief Complaint  Patient presents with  . Ear Fullness   (Consider location/radiation/quality/duration/timing/severity/associated sxs/prior Treatment) Patient is a 53 y.o. female presenting with plugged ear sensation. The history is provided by the patient.  Ear Fullness This is a new problem. The current episode started more than 1 week ago. The problem occurs constantly. The problem has not changed since onset.Associated symptoms comments: S/p bronchitis last week..    Past Medical History  Diagnosis Date  . Hiatal hernia   . GERD (gastroesophageal reflux disease)   . Seasonal allergies   . Arthritis     right hand  . Mass of finger of right hand 02/2013    right middle finger  . Sinus congestion 03/16/2013    cough, stuffy and runny nose  . Wears partial dentures     upper  . Urine incontinence   . Chicken pox     Childhood  . Diverticulosis of colon (without mention of hemorrhage)   . Hyperplastic colon polyp    Past Surgical History  Procedure Laterality Date  . Foot surgery Bilateral     exc. ingrown toenail great toe  . Tubal ligation  09/02/2002  . Colonoscopy  08/14/2007    562.10, 211.3   Family History  Problem Relation Age of Onset  . Kidney disease Father   . Heart disease Father   . Stroke Father   . Hypertension Father   . Heart disease Mother   . Depression Mother   . Colon cancer Neg Hx    History  Substance Use Topics  . Smoking status: Former Research scientist (life sciences)  . Smokeless tobacco: Never Used     Comment: quit smoking 20 years ago  . Alcohol Use: No   OB History   Grav Para Term Preterm Abortions TAB SAB Ect Mult Living   1 1 1       1      Review of Systems  Constitutional: Negative.   HENT: Positive for congestion, postnasal drip and rhinorrhea. Negative for ear discharge and ear pain.     Allergies  Shellfish  allergy  Home Medications   Prior to Admission medications   Medication Sig Start Date End Date Taking? Authorizing Provider  albuterol (PROVENTIL HFA;VENTOLIN HFA) 108 (90 BASE) MCG/ACT inhaler Inhale 2 puffs into the lungs 4 (four) times daily. 02/24/14   Harden Mo, MD  azithromycin (ZITHROMAX Z-PAK) 250 MG tablet Take as directed. 02/24/14   Harden Mo, MD  chlorpheniramine-HYDROcodone (TUSSIONEX) 10-8 MG/5ML LQCR Take 5 mLs by mouth every 12 (twelve) hours as needed for cough. 02/24/14   Harden Mo, MD  Dexlansoprazole (DEXILANT) 30 MG capsule Take 1 capsule (30 mg total) by mouth daily. 11/30/13   Sable Feil, MD  HYDROcodone-acetaminophen (NORCO/VICODIN) 5-325 MG per tablet Take 1-2 tablets by mouth every 6 (six) hours as needed for moderate pain. 02/02/14   Lahoma Rocker, PA  meloxicam (MOBIC) 15 MG tablet Take 1 tablet (15 mg total) by mouth daily. 02/09/14   Janith Lima, MD  methocarbamol (ROBAXIN) 500 MG tablet Take 1 tablet (500 mg total) by mouth 3 (three) times daily. 02/09/14   Janith Lima, MD  predniSONE (DELTASONE) 20 MG tablet Take 1 tablet (20 mg total) by mouth 2 (two) times daily. 02/24/14   Harden Mo, MD  BP 121/65  Pulse 77  Temp(Src) 97.8 F (36.6 C) (Oral)  Resp 12  SpO2 97% Physical Exam  Nursing note and vitals reviewed. Constitutional: She is oriented to person, place, and time. She appears well-developed and well-nourished.  HENT:  Head: Normocephalic.  Right Ear: External ear normal.  Left Ear: External ear normal.  Nose: Nose normal.  Mouth/Throat: Oropharynx is clear and moist.  Eyes: Pupils are equal, round, and reactive to light.  Neck: Normal range of motion. Neck supple.  Cardiovascular: Normal heart sounds.   Pulmonary/Chest: Breath sounds normal.  Lymphadenopathy:    She has no cervical adenopathy.  Neurological: She is alert and oriented to person, place, and time.  Skin: Skin is warm and dry.    ED Course   Procedures (including critical care time) Labs Review Labs Reviewed - No data to display  Imaging Review No results found.   MDM   1. Eustachian tube dysfunction       Billy Fischer, MD 03/08/14 [phone]

## 2014-03-28 DIAGNOSIS — Z0279 Encounter for issue of other medical certificate: Secondary | ICD-10-CM

## 2014-04-11 ENCOUNTER — Ambulatory Visit: Payer: BC Managed Care – PPO | Attending: Internal Medicine | Admitting: Physical Therapy

## 2014-04-11 DIAGNOSIS — IMO0001 Reserved for inherently not codable concepts without codable children: Secondary | ICD-10-CM | POA: Insufficient documentation

## 2014-04-11 DIAGNOSIS — M545 Low back pain, unspecified: Secondary | ICD-10-CM | POA: Insufficient documentation

## 2014-04-11 DIAGNOSIS — M25559 Pain in unspecified hip: Secondary | ICD-10-CM | POA: Insufficient documentation

## 2014-04-26 ENCOUNTER — Ambulatory Visit (INDEPENDENT_AMBULATORY_CARE_PROVIDER_SITE_OTHER): Payer: BC Managed Care – PPO | Admitting: Internal Medicine

## 2014-04-26 ENCOUNTER — Ambulatory Visit: Payer: BC Managed Care – PPO | Admitting: Physical Therapy

## 2014-04-26 ENCOUNTER — Other Ambulatory Visit: Payer: BC Managed Care – PPO

## 2014-04-26 ENCOUNTER — Encounter: Payer: Self-pay | Admitting: Internal Medicine

## 2014-04-26 VITALS — BP 110/80 | HR 78 | Temp 98.2°F | Wt 205.8 lb

## 2014-04-26 DIAGNOSIS — M5417 Radiculopathy, lumbosacral region: Secondary | ICD-10-CM

## 2014-04-26 DIAGNOSIS — R350 Frequency of micturition: Secondary | ICD-10-CM

## 2014-04-26 DIAGNOSIS — R8290 Unspecified abnormal findings in urine: Secondary | ICD-10-CM

## 2014-04-26 DIAGNOSIS — IMO0002 Reserved for concepts with insufficient information to code with codable children: Secondary | ICD-10-CM

## 2014-04-26 DIAGNOSIS — R82998 Other abnormal findings in urine: Secondary | ICD-10-CM

## 2014-04-26 LAB — POCT URINALYSIS DIPSTICK
BILIRUBIN UA: NEGATIVE
Glucose, UA: NEGATIVE
Ketones, UA: NEGATIVE
Leukocytes, UA: NEGATIVE
NITRITE UA: NEGATIVE
PH UA: 5
PROTEIN UA: NEGATIVE
RBC UA: NEGATIVE
Spec Grav, UA: 1.01
Urobilinogen, UA: 0.2

## 2014-04-26 MED ORDER — GABAPENTIN 100 MG PO CAPS: ORAL_CAPSULE | ORAL | Status: DC

## 2014-04-26 NOTE — Progress Notes (Signed)
   Subjective:    Patient ID: Marie Torres, female    DOB: 09/17/1961, 53 y.o.   MRN: [id number]  HPI Pt began having urinary symptoms approximately 1 week ago. She describes hesitancy, urgency and frequency. She denies dysuria. The pt notes a foul odor to her urine. Denies hematuria or pyuria. She reports fevers, chills and sweats. She has an achy lower back but reports chronic back problems.   Pt denies vaginal discharge or pruritis. Denies vaginal bleeding. Pt is post menopausal. Per pt report last WWE in 4/15 with pap was normal.   She has had some nausea, no vomiting. She reports suprapubic and lower abdominal pressure. She thinks the pain may have started out in there epigastric area to begin with, but is unable to specify the discomfort. She has dexilant which she is not currently taking. She denies any heartburn or reflux like symptoms. She does have her abdominal organs.     Review of Systems  Constitutional:       Decreased appetite  Cardiovascular: Negative for chest pain.  Gastrointestinal: Negative for diarrhea, constipation and blood in stool.  Genitourinary: Negative for dysuria, hematuria and vaginal discharge.      Objective:   Physical Exam No CVA tenderness  Suprapubic tenderness RLQ abdominal pain > LLQ Pt appears to be in pain upon walking and getting onto and off of the table      Assessment & Plan:  #1 urinary frequency  #2 abdominal pain; I would check CBC for elevated WBC. Consider abd U/S.   Appendicitis, Gyn related, GERD uncontrolled

## 2014-04-26 NOTE — Patient Instructions (Signed)
Your next office appointment will be determined based upon review of your pending urine culture.-rays. Those instructions will be transmitted to you through My Chart  OR  by mail;whichever process is your choice to receive results & recommendations .  Followup as needed for your acute issue. Please report any significant change in your symptoms.   Assess response to the gabapentin one every 8 hours as needed. If it is partially beneficial, it can be increased up to a total of 3 pills every 8 hours as needed. This increase of 1 pill each dose  should take place over 72 hours at least.

## 2014-04-26 NOTE — Progress Notes (Signed)
   Subjective:    Patient ID: Marie Torres, female    DOB: 06/06/1961, 53 y.o.   MRN: [id number]  HPI   She's had symptoms for approximately one week described as hesitancy, urgency,& frequency. There is no associated dysuria. She notes some malodorous character to the urine. She also denies pyuria or hematuria.She believes she has had some fevers chills, sweats. She has achy lower back pain. She is in Physical Therapy for chronic back issues.  There is no associated vaginal discharge or pruritus.  Symptoms are associated with some nausea without vomiting. She has pressure in the suprapubic and lower abdominal areas. Initially she questioned whether the discomfort was in epigastrium. She has been on Dexilant  in the past but is not taking this  Gyn exam normal in 4/15.  Review of Systems   She has no associated bowel changes of constipation or diarrhea. She has no melena rectal bleeding.     Objective:   Physical Exam General appearance: good health and nourishment w/o distress.  Eyes: No conjunctival inflammation or scleral icterus is present.  Oral exam: Dental hygiene is good; lips and gums are healthy appearing.There is no oropharyngeal erythema or exudate noted.   Heart:  Normal rate and regular rhythm. S1 and S2 normal without gallop, murmur, click, rub or other extra sounds     Lungs:Chest clear to auscultation; no wheezes, rhonchi,rales ,or rubs present.No increased work of breathing.   Abdomen: bowel sounds normal, soft and non-tender without masses, organomegaly or hernias noted.  No guarding or rebound . No tenderness over the flanks to percussion  Musculoskeletal: Able to lie flat and sit up without help but she is uncomfortable. Negative straight leg raising bilaterally. She has low back pain with normal gait as well as with heel and toe walking she exhibited the classic "low back crawl" sitting up from supine position  Skin:Warm & dry.  Intact without suspicious  lesions or rashes   Lymphatic: No lymphadenopathy is noted about the head, neck, axilla                Assessment & Plan:  #1 hesitancy, urgency, and frequency with malodorous urine. Urinalysis is completely normal; but it will be cultured  #2 chronic low back pain; she is to continue physical therapy. Gabapentin trial initiated.

## 2014-04-26 NOTE — Progress Notes (Signed)
Pre visit review using our clinic review tool, if applicable. No additional management support is needed unless otherwise documented below in the visit note. 

## 2014-04-27 LAB — URINE CULTURE

## 2014-05-02 ENCOUNTER — Encounter: Payer: BC Managed Care – PPO | Admitting: Physical Therapy

## 2014-05-04 ENCOUNTER — Ambulatory Visit: Payer: BC Managed Care – PPO | Attending: Internal Medicine | Admitting: Physical Therapy

## 2014-05-04 ENCOUNTER — Encounter: Payer: BC Managed Care – PPO | Admitting: Physical Therapy

## 2014-05-04 DIAGNOSIS — M545 Low back pain, unspecified: Secondary | ICD-10-CM | POA: Insufficient documentation

## 2014-05-04 DIAGNOSIS — IMO0001 Reserved for inherently not codable concepts without codable children: Secondary | ICD-10-CM | POA: Insufficient documentation

## 2014-05-04 DIAGNOSIS — M25559 Pain in unspecified hip: Secondary | ICD-10-CM | POA: Insufficient documentation

## 2014-05-06 ENCOUNTER — Encounter: Payer: Self-pay | Admitting: Internal Medicine

## 2014-05-06 ENCOUNTER — Other Ambulatory Visit (INDEPENDENT_AMBULATORY_CARE_PROVIDER_SITE_OTHER): Payer: BC Managed Care – PPO

## 2014-05-06 ENCOUNTER — Ambulatory Visit (INDEPENDENT_AMBULATORY_CARE_PROVIDER_SITE_OTHER): Payer: BC Managed Care – PPO | Admitting: Internal Medicine

## 2014-05-06 VITALS — BP 120/80 | HR 76 | Temp 97.6°F | Resp 16 | Ht 61.0 in | Wt 201.4 lb

## 2014-05-06 DIAGNOSIS — R10813 Right lower quadrant abdominal tenderness: Secondary | ICD-10-CM

## 2014-05-06 DIAGNOSIS — R10816 Epigastric abdominal tenderness: Secondary | ICD-10-CM

## 2014-05-06 DIAGNOSIS — R10819 Abdominal tenderness, unspecified site: Secondary | ICD-10-CM | POA: Insufficient documentation

## 2014-05-06 LAB — COMPREHENSIVE METABOLIC PANEL
ALBUMIN: 3.4 g/dL — AB (ref 3.5–5.2)
ALK PHOS: 58 U/L (ref 39–117)
ALT: 16 U/L (ref 0–35)
AST: 22 U/L (ref 0–37)
BUN: 15 mg/dL (ref 6–23)
CALCIUM: 9.3 mg/dL (ref 8.4–10.5)
CHLORIDE: 105 meq/L (ref 96–112)
CO2: 25 mEq/L (ref 19–32)
Creatinine, Ser: 0.8 mg/dL (ref 0.4–1.2)
GFR: 95.76 mL/min (ref >60.00)
GLUCOSE: 130 mg/dL — AB (ref 70–99)
POTASSIUM: 3.6 meq/L (ref 3.5–5.1)
SODIUM: 137 meq/L (ref 135–145)
Total Bilirubin: 0.6 mg/dL (ref 0.2–1.2)
Total Protein: 7.7 g/dL (ref 6.0–8.3)

## 2014-05-06 LAB — URINALYSIS, ROUTINE W REFLEX MICROSCOPIC
Bilirubin Urine: NEGATIVE
HGB URINE DIPSTICK: NEGATIVE
Ketones, ur: NEGATIVE
Leukocytes, UA: NEGATIVE
Nitrite: NEGATIVE
SPECIFIC GRAVITY, URINE: 1.025 (ref 1.000–1.030)
TOTAL PROTEIN, URINE-UPE24: NEGATIVE
Urine Glucose: NEGATIVE
Urobilinogen, UA: 0.2 (ref 0.0–1.0)
pH: 5.5 (ref 5.0–8.0)

## 2014-05-06 LAB — HCG, QUANTITATIVE, PREGNANCY: Quantitative HCG: 1.87 m[IU]/mL

## 2014-05-06 LAB — CBC WITH DIFFERENTIAL/PLATELET
BASOS ABS: 0 10*3/uL (ref 0.0–0.1)
Basophils Relative: 0.2 % (ref 0.0–3.0)
Eosinophils Absolute: 0.2 10*3/uL (ref 0.0–0.7)
Eosinophils Relative: 2.8 % (ref 0.0–5.0)
HCT: 39 % (ref 36.0–46.0)
Hemoglobin: 12.8 g/dL (ref 12.0–15.0)
Lymphocytes Relative: 26.2 % (ref 12.0–46.0)
Lymphs Abs: 2.2 10*3/uL (ref 0.7–4.0)
MCHC: 32.9 g/dL (ref 30.0–36.0)
MCV: 84.6 fl (ref 78.0–100.0)
MONO ABS: 0.5 10*3/uL (ref 0.1–1.0)
Monocytes Relative: 5.9 % (ref 3.0–12.0)
Neutro Abs: 5.6 10*3/uL (ref 1.4–7.7)
Neutrophils Relative %: 64.9 % (ref 43.0–77.0)
PLATELETS: 366 10*3/uL (ref 150.0–400.0)
RBC: 4.61 Mil/uL (ref 3.87–5.11)
RDW: 12.7 % (ref 11.5–15.5)
WBC: 8.6 10*3/uL (ref 4.0–10.5)

## 2014-05-06 LAB — AMYLASE: AMYLASE: 192 U/L — AB (ref 27–131)

## 2014-05-06 LAB — LIPASE: LIPASE: 120 U/L — AB (ref 11.0–59.0)

## 2014-05-06 NOTE — Progress Notes (Signed)
Pre visit review using our clinic review tool, if applicable. No additional management support is needed unless otherwise documented below in the visit note. 

## 2014-05-06 NOTE — Progress Notes (Signed)
Subjective:    Patient ID: Marie Torres, female    DOB: [birth year], 53 y.o.   MRN: [id number]  Abdominal Pain This is a recurrent problem. The current episode started 1 to 4 weeks ago. The onset quality is gradual. The problem occurs intermittently. The problem has been unchanged. The pain is located in the RLQ. The pain is at a severity of 1/10. The pain is mild. The quality of the pain is aching. The abdominal pain does not radiate. Pertinent negatives include no anorexia, arthralgias, belching, constipation, diarrhea, dysuria, fever, flatus, frequency, headaches, hematochezia, hematuria, melena, myalgias, nausea, vomiting or weight loss. The pain is aggravated by coughing. The pain is relieved by nothing. She has tried nothing for the symptoms. The treatment provided no relief.      Review of Systems  Constitutional: Negative.  Negative for fever, chills, weight loss, diaphoresis, activity change, appetite change, fatigue and unexpected weight change.  HENT: Negative.   Eyes: Negative.   Respiratory: Negative.  Negative for apnea, cough, choking, chest tightness, shortness of breath, wheezing and stridor.   Cardiovascular: Negative.  Negative for chest pain, palpitations and leg swelling.  Gastrointestinal: Positive for abdominal pain. Negative for nausea, vomiting, diarrhea, constipation, blood in stool, melena, hematochezia, abdominal distention, anal bleeding, rectal pain, anorexia and flatus.  Endocrine: Negative.   Genitourinary: Negative.  Negative for dysuria, urgency, frequency, hematuria, flank pain, decreased urine volume, vaginal bleeding, vaginal discharge, enuresis, difficulty urinating, genital sores, vaginal pain, menstrual problem, pelvic pain and dyspareunia.  Musculoskeletal: Negative.  Negative for arthralgias and myalgias.  Skin: Negative.  Negative for rash.  Allergic/Immunologic: Negative.   Neurological: Negative.  Negative for headaches.  Hematological: Negative.   Negative for adenopathy. Does not bruise/bleed easily.  Psychiatric/Behavioral: Negative.        Objective:   Physical Exam  Vitals reviewed. Constitutional: She is oriented to person, place, and time. She appears well-developed and well-nourished. No distress.  HENT:  Head: Normocephalic and atraumatic.  Mouth/Throat: Oropharynx is clear and moist. No oropharyngeal exudate.  Eyes: Conjunctivae are normal. Right eye exhibits no discharge. Left eye exhibits no discharge. No scleral icterus.  Neck: Normal range of motion. Neck supple. No JVD present. No tracheal deviation present. No thyromegaly present.  Cardiovascular: Normal rate, regular rhythm, normal heart sounds and intact distal pulses.  Exam reveals no gallop and no friction rub.   No murmur heard. Pulmonary/Chest: Effort normal and breath sounds normal. No stridor. No respiratory distress. She has no wheezes. She has no rales. She exhibits no tenderness.  Abdominal: Soft. Normal appearance and bowel sounds are normal. She exhibits no shifting dullness, no distension, no pulsatile liver, no fluid wave, no ascites, no pulsatile midline mass and no mass. There is no hepatosplenomegaly, splenomegaly or hepatomegaly. There is tenderness in the right lower quadrant. There is guarding. There is no rigidity, no rebound, no CVA tenderness, no tenderness at McBurney's point and negative Murphy's sign. No hernia. Hernia confirmed negative in the ventral area, confirmed negative in the right inguinal area and confirmed negative in the left inguinal area.  Genitourinary: Vagina normal. No breast swelling, tenderness, discharge or bleeding. Pelvic exam was performed with patient supine. No labial fusion. There is no rash, tenderness, lesion or injury on the right labia. There is no rash, tenderness, lesion or injury on the left labia. No erythema, tenderness or bleeding around the vagina. No foreign body around the vagina. No signs of injury around the  vagina. No vaginal discharge found.  Musculoskeletal: Normal range of motion. She exhibits no edema and no tenderness.  Lymphadenopathy:    She has no cervical adenopathy.       Right: No inguinal adenopathy present.       Left: No inguinal adenopathy present.  Neurological: She is oriented to person, place, and time.  Skin: Skin is warm and dry. No rash noted. She is not diaphoretic. No erythema. No pallor.  Psychiatric: She has a normal mood and affect. Her behavior is normal. Judgment and thought content normal.     Lab Results  Component Value Date   WBC 7.6 11/30/2013   HGB 13.9 11/30/2013   HCT 42.6 11/30/2013   PLT 335.0 11/30/2013   GLUCOSE 95 11/30/2013   CHOL 216* 11/30/2013   TRIG 128.0 11/30/2013   HDL 43.40 11/30/2013   LDLDIRECT 150.8 11/30/2013   ALT 20 11/30/2013   AST 22 11/30/2013   NA 141 11/30/2013   K 4.4 11/30/2013   CL 107 11/30/2013   CREATININE 0.9 11/30/2013   BUN 14 11/30/2013   CO2 26 11/30/2013   TSH 0.58 11/30/2013       Assessment & Plan:

## 2014-05-06 NOTE — Patient Instructions (Signed)

## 2014-05-08 ENCOUNTER — Encounter: Payer: Self-pay | Admitting: Internal Medicine

## 2014-05-08 NOTE — Assessment & Plan Note (Signed)
Her exam and the chronicity of the pain do not suggest an acute abd process, her CBC/UA/CMP are normal but her amylase and lipase are slightly elevated. I have ordered a CT scan to see if she has a mass, occult hernia, pancreatitis, pancreatic mass or lesion, ovarian lesion ,etc.

## 2014-05-11 ENCOUNTER — Telehealth: Payer: Self-pay | Admitting: Internal Medicine

## 2014-05-11 NOTE — Telephone Encounter (Signed)
Patient is calling to request her lab results from last Friday. Please advise.

## 2014-05-11 NOTE — Telephone Encounter (Signed)
Her pancreas enzymes were slightly elevated Has she had the CT scan done yet?

## 2014-05-12 ENCOUNTER — Ambulatory Visit: Payer: BC Managed Care – PPO

## 2014-05-12 NOTE — Telephone Encounter (Signed)
Pt notified of results but states that she has not had a CT scan done yet because she has not received any appointment information. I advised pt that I would forward to [facility] to call her back regarding appt.

## 2014-05-12 NOTE — Telephone Encounter (Signed)
Spoke w/pt. She is aware [facility] will contact her directly to schedule appt.

## 2014-05-16 ENCOUNTER — Encounter: Payer: BC Managed Care – PPO | Admitting: Physical Therapy

## 2014-05-17 ENCOUNTER — Ambulatory Visit
Admission: RE | Admit: 2014-05-17 | Discharge: 2014-05-17 | Disposition: A | Discharge status: Home / Self Care | Payer: BC Managed Care – PPO | Source: Ambulatory Visit | Attending: Internal Medicine | Admitting: Internal Medicine

## 2014-05-17 DIAGNOSIS — R10813 Right lower quadrant abdominal tenderness: Secondary | ICD-10-CM

## 2014-05-17 MED ORDER — IOHEXOL 300 MG/ML  SOLN
125.0000 mL | Freq: Once | INTRAMUSCULAR | Status: AC | PRN
  Administered 2014-05-17: 125 mL via INTRAVENOUS

## 2014-05-18 ENCOUNTER — Encounter: Payer: BC Managed Care – PPO | Admitting: Physical Therapy

## 2014-05-20 ENCOUNTER — Telehealth: Payer: Self-pay | Admitting: Internal Medicine

## 2014-05-20 NOTE — Telephone Encounter (Signed)
Patient had CT Tuesday.  She called inquiring on results.  I told her that she probably would not hear anything until next week but would send a message back that she called to inquire.

## 2014-05-20 NOTE — Telephone Encounter (Signed)
CT IMPRESSION:  Normal appendix. No acute inflammation. Left colon diverticulosis.  Aortoiliac calcified atherosclerosis.  Plan: as instructed by Dr Ronnald Ramp. OV w/Dr Ronnald Ramp next wk Thx

## 2014-05-23 ENCOUNTER — Encounter: Payer: BC Managed Care – PPO | Admitting: Physical Therapy

## 2014-05-23 NOTE — Telephone Encounter (Signed)
Pt notified and appt scheduled.

## 2014-05-26 ENCOUNTER — Encounter: Payer: BC Managed Care – PPO | Admitting: Physical Therapy

## 2014-05-30 ENCOUNTER — Encounter: Payer: Self-pay | Admitting: Internal Medicine

## 2014-05-30 ENCOUNTER — Ambulatory Visit (INDEPENDENT_AMBULATORY_CARE_PROVIDER_SITE_OTHER): Payer: BC Managed Care – PPO | Admitting: Internal Medicine

## 2014-05-30 VITALS — BP 118/70 | HR 78 | Temp 98.7°F | Resp 16 | Ht 61.0 in | Wt 206.0 lb

## 2014-05-30 DIAGNOSIS — R10813 Right lower quadrant abdominal tenderness: Secondary | ICD-10-CM

## 2014-05-30 DIAGNOSIS — K4021 Bilateral inguinal hernia, without obstruction or gangrene, recurrent: Secondary | ICD-10-CM | POA: Insufficient documentation

## 2014-05-30 NOTE — Patient Instructions (Signed)

## 2014-05-30 NOTE — Progress Notes (Signed)
Pre visit review using our clinic review tool, if applicable. No additional management support is needed unless otherwise documented below in the visit note. 

## 2014-06-01 ENCOUNTER — Encounter: Payer: Self-pay | Admitting: Internal Medicine

## 2014-06-01 NOTE — Assessment & Plan Note (Signed)
GS referral

## 2014-06-01 NOTE — Progress Notes (Signed)
   Subjective:    Patient ID: Marie Torres, female    DOB: Nov 27, 1960, 53 y.o.   MRN: [id number]  Abdominal Pain This is a recurrent problem. The current episode started more than 1 month ago. The onset quality is gradual. The problem occurs intermittently. The problem has been unchanged. The pain is located in the suprapubic region. The pain is at a severity of 1/10. The pain is mild. The quality of the pain is aching. The abdominal pain does not radiate. Pertinent negatives include no anorexia, arthralgias, belching, constipation, diarrhea, dysuria, fever, flatus, frequency, headaches, hematochezia, hematuria, melena, myalgias, nausea, vomiting or weight loss. The pain is aggravated by coughing, movement and certain positions. The pain is relieved by nothing. She has tried nothing for the symptoms. The treatment provided no relief. Prior diagnostic workup includes CT scan.      Review of Systems  Constitutional: Negative.  Negative for fever, chills, weight loss, diaphoresis, appetite change and fatigue.  HENT: Negative.   Eyes: Negative.   Respiratory: Negative.  Negative for cough and shortness of breath.   Cardiovascular: Negative.  Negative for chest pain, palpitations and leg swelling.  Gastrointestinal: Positive for abdominal pain. Negative for nausea, vomiting, diarrhea, constipation, blood in stool, melena, hematochezia, abdominal distention, anal bleeding, rectal pain, anorexia and flatus.  Endocrine: Negative.   Genitourinary: Negative.  Negative for dysuria, urgency, frequency, hematuria, flank pain, decreased urine volume, vaginal bleeding, vaginal discharge, enuresis, difficulty urinating, genital sores, vaginal pain, menstrual problem and pelvic pain.  Musculoskeletal: Negative for arthralgias and myalgias.  Skin: Negative.  Negative for rash.  Allergic/Immunologic: Negative.   Neurological: Negative.  Negative for headaches.  Hematological: Negative.  Negative for adenopathy.  Does not bruise/bleed easily.  Psychiatric/Behavioral: Negative.        Objective:   Physical Exam  Vitals reviewed. Constitutional: She is oriented to person, place, and time. She appears well-developed and well-nourished.  Non-toxic appearance. She does not have a sickly appearance. She does not appear ill. No distress.  HENT:  Head: Normocephalic and atraumatic.  Mouth/Throat: Oropharynx is clear and moist. No oropharyngeal exudate.  Eyes: Conjunctivae are normal. Right eye exhibits no discharge. Left eye exhibits no discharge. No scleral icterus.  Neck: Normal range of motion. Neck supple. No JVD present. No tracheal deviation present. No thyromegaly present.  Cardiovascular: Normal rate, regular rhythm, normal heart sounds and intact distal pulses.  Exam reveals no gallop and no friction rub.   No murmur heard. Pulmonary/Chest: Effort normal and breath sounds normal. No stridor. No respiratory distress. She has no wheezes. She has no rales. She exhibits no tenderness.  Abdominal: Soft. Normal appearance and bowel sounds are normal. She exhibits no shifting dullness, no distension, no pulsatile liver, no fluid wave, no abdominal bruit, no ascites, no pulsatile midline mass and no mass. There is no hepatosplenomegaly, splenomegaly or hepatomegaly. There is no tenderness. There is no rebound, no guarding and no CVA tenderness. A hernia is present. Hernia confirmed positive in the right inguinal area. Hernia confirmed negative in the ventral area and confirmed negative in the left inguinal area.  Musculoskeletal: Normal range of motion. She exhibits no edema and no tenderness.  Lymphadenopathy:    She has no cervical adenopathy.  Neurological: She is oriented to person, place, and time.  Skin: Skin is warm and dry. No rash noted. She is not diaphoretic. No erythema. No pallor.          Assessment & Plan:

## 2014-06-01 NOTE — Assessment & Plan Note (Signed)
CT scan was normal I have asked her to see GS to see if the hernia is causing her pain

## 2014-06-03 ENCOUNTER — Ambulatory Visit: Payer: BC Managed Care – PPO | Admitting: Internal Medicine

## 2014-06-16 ENCOUNTER — Ambulatory Visit (INDEPENDENT_AMBULATORY_CARE_PROVIDER_SITE_OTHER): Payer: BC Managed Care – PPO | Admitting: Surgery

## 2014-06-20 ENCOUNTER — Ambulatory Visit (INDEPENDENT_AMBULATORY_CARE_PROVIDER_SITE_OTHER): Payer: PRIVATE HEALTH INSURANCE | Admitting: Surgery

## 2014-06-20 ENCOUNTER — Encounter (INDEPENDENT_AMBULATORY_CARE_PROVIDER_SITE_OTHER): Payer: Self-pay | Admitting: Surgery

## 2014-06-20 VITALS — BP 124/80 | HR 73 | Temp 97.0°F | Ht 61.0 in | Wt 204.0 lb

## 2014-06-20 DIAGNOSIS — R1031 Right lower quadrant pain: Secondary | ICD-10-CM

## 2014-06-20 DIAGNOSIS — R109 Unspecified abdominal pain: Secondary | ICD-10-CM

## 2014-06-20 DIAGNOSIS — R1030 Lower abdominal pain, unspecified: Secondary | ICD-10-CM | POA: Insufficient documentation

## 2014-06-20 MED ORDER — TRAMADOL HCL 50 MG PO TABS: 50.0000 mg | ORAL_TABLET | Freq: Four times a day (QID) | ORAL | Status: DC | PRN

## 2014-06-20 NOTE — Patient Instructions (Signed)

## 2014-06-21 NOTE — Progress Notes (Signed)
Patient ID: Marie Torres, female   DOB: 1960-11-23, 53 y.o.   MRN: [id number]  Chief Complaint  Patient presents with  . Abdominal Pain    HPI Marie Torres is a 53 y.o. female.  Pt sent at the request of  Dr Scarlette Calico for right groin pain.  Duration is 1 month.  Sharp without radiation.  Coughing makes it worse.  No obvious bulge.No change in bowel habits.  HPI  Past Medical History  Diagnosis Date  . Hiatal hernia   . GERD (gastroesophageal reflux disease)   . Seasonal allergies   . Arthritis     right hand  . Mass of finger of right hand 02/2013    right middle finger  . Sinus congestion 03/16/2013    cough, stuffy and runny nose  . Wears partial dentures     upper  . Urine incontinence   . Chicken pox     Childhood  . Diverticulosis of colon (without mention of hemorrhage)   . Hyperplastic colon polyp     Past Surgical History  Procedure Laterality Date  . Foot surgery Bilateral     exc. ingrown toenail great toe  . Tubal ligation  09/02/2002  . Colonoscopy  08/14/2007    562.10, 211.3    Family History  Problem Relation Age of Onset  . Kidney disease Father   . Heart disease Father   . Stroke Father   . Hypertension Father   . Heart disease Mother   . Depression Mother   . Colon cancer Neg Hx     Social History History  Substance Use Topics  . Smoking status: Former Research scientist (life sciences)  . Smokeless tobacco: Never Used     Comment: quit smoking 20 years ago  . Alcohol Use: No    Allergies  Allergen Reactions  . Shellfish Allergy Hives    Current Outpatient Prescriptions  Medication Sig Dispense Refill  . traMADol (ULTRAM) 50 MG tablet Take 1 tablet (50 mg total) by mouth every 6 (six) hours as needed.  40 tablet  0   No current facility-administered medications for this visit.    Review of Systems Review of Systems  Constitutional: Negative for fever, chills and unexpected weight change.  HENT: Negative for congestion, hearing loss, sore throat, trouble  swallowing and voice change.   Eyes: Negative for visual disturbance.  Respiratory: Negative for cough and wheezing.   Cardiovascular: Negative for chest pain, palpitations and leg swelling.  Gastrointestinal: Negative for nausea, vomiting, abdominal pain, diarrhea, constipation, blood in stool, abdominal distention and anal bleeding.  Genitourinary: Negative for hematuria, vaginal bleeding and difficulty urinating.  Musculoskeletal: Negative for arthralgias.  Skin: Negative for rash and wound.  Neurological: Negative for seizures, syncope and headaches.  Hematological: Negative for adenopathy. Does not bruise/bleed easily.  Psychiatric/Behavioral: Negative for confusion.    Blood pressure 124/80, pulse 73, temperature 97 F (36.1 C), height 5\' 1"  (1.549 m), weight 204 lb (92.534 kg).  Physical Exam Physical Exam  Constitutional: She is oriented to person, place, and time. She appears well-developed and well-nourished.  HENT:  Head: Normocephalic and atraumatic.  Eyes: Pupils are equal, round, and reactive to light. No scleral icterus.  Neck: Normal range of motion. Neck supple.  Cardiovascular: Normal rate.   Pulmonary/Chest: Effort normal.  Abdominal: Soft. She exhibits no distension. There is tenderness. There is no rebound. No hernia. Hernia confirmed negative in the right inguinal area and confirmed negative in the left inguinal area.    Musculoskeletal:  Normal range of motion.  Neurological: She is alert and oriented to person, place, and time.  Skin: Skin is warm and dry.  Psychiatric: She has a normal mood and affect. Her behavior is normal. Judgment and thought content normal.    Data Reviewed Office notes  Assessment    Right groin inguinodynia    Plan    Rest and NSAIDS for pain.  Follow up in 3 weeks.  If no better,  May need laparoscopy.        Jeren Dufrane A. 06/21/2014, 12:55 PM

## 2014-07-13 ENCOUNTER — Encounter: Payer: Self-pay | Admitting: Gastroenterology

## 2014-07-19 ENCOUNTER — Telehealth: Payer: Self-pay | Admitting: Internal Medicine

## 2014-07-19 NOTE — Telephone Encounter (Signed)
Patient Information:  Caller Name: Eriyonna  Phone: [phone]  Patient: Dennis, Killilea  Gender: Female  DOB: 11-14-1960  Age: 53 Years  PCP: Scarlette Calico (Adults only)  Pregnant: No  Office Follow Up:  Does the office need to follow up with this patient?: Yes  Instructions For The Office: Would like appointment for tomorrow 9/23 if possible.  Can contact patient at [phone].  RN Note:  Has appointment Friday 9/25 at 1:45 pm  Symptoms  Reason For Call & Symptoms: Has sore area on left side of vagina, stinging with urinantion, general aching pain all the time but can not see any difference in the skin.  Pain increases when walking.  Reviewed Health History In EMR: Yes  Reviewed Medications In EMR: Yes  Reviewed Allergies In EMR: Yes  Reviewed Surgeries / Procedures: Yes  Date of Onset of Symptoms: 07/14/2014 OB / GYN:  LMP: Unknown  Guideline(s) Used:  Vulvar Symptoms  Disposition Per Guideline:   See Today in Office  Reason For Disposition Reached:   Patient wants to be seen  Advice Given:  Reassurance:  Common causes of mild vaginal itching are new soaps/detergent, perfumed toilet products, hormone changes, and excessive perspiration. Sometimes itching can be caused by a yeast infection.  Genital Hygiene:  Keep your genital area clean. Wash daily.  Keep your genital area dry. Wear cotton underwear or underwear with a cotton crotch.  Do not use feminine hygiene products.  Call Back If:  Fever occurs  You become worse.  Patient Will Follow Care Advice:  YES

## 2014-07-22 ENCOUNTER — Ambulatory Visit: Payer: BC Managed Care – PPO | Admitting: Internal Medicine

## 2014-08-29 ENCOUNTER — Encounter (INDEPENDENT_AMBULATORY_CARE_PROVIDER_SITE_OTHER): Payer: Self-pay | Admitting: Surgery

## 2014-08-31 ENCOUNTER — Encounter: Payer: Self-pay | Admitting: Internal Medicine

## 2014-08-31 ENCOUNTER — Ambulatory Visit: Payer: BC Managed Care – PPO | Admitting: Internal Medicine

## 2014-08-31 ENCOUNTER — Ambulatory Visit (INDEPENDENT_AMBULATORY_CARE_PROVIDER_SITE_OTHER): Payer: BC Managed Care – PPO | Admitting: Internal Medicine

## 2014-08-31 VITALS — BP 110/70 | HR 80 | Temp 98.4°F | Ht 61.0 in | Wt 208.0 lb

## 2014-08-31 DIAGNOSIS — R1030 Lower abdominal pain, unspecified: Secondary | ICD-10-CM

## 2014-08-31 NOTE — Patient Instructions (Signed)
Pelvic Pain Female pelvic pain can be caused by many different things and start from a variety of places. Pelvic pain refers to pain that is located in the lower half of the abdomen and between your hips. The pain may occur over a short period of time (acute) or may be reoccurring (chronic). The cause of pelvic pain may be related to disorders affecting the female reproductive organs (gynecologic), but it may also be related to the bladder, kidney stones, an intestinal complication, or muscle or skeletal problems. Getting help right away for pelvic pain is important, especially if there has been severe, sharp, or a sudden onset of unusual pain. It is also important to get help right away because some types of pelvic pain can be life threatening.  CAUSES  Below are only some of the causes of pelvic pain. The causes of pelvic pain can be in one of several categories.   Gynecologic.  Pelvic inflammatory disease.  Sexually transmitted infection.  Ovarian cyst or a twisted ovarian ligament (ovarian torsion).  Uterine lining that grows outside the uterus (endometriosis).  Fibroids, cysts, or tumors.  Ovulation.  Pregnancy.  Pregnancy that occurs outside the uterus (ectopic pregnancy).  Miscarriage.  Labor.  Abruption of the placenta or ruptured uterus.  Infection.  Uterine infection (endometritis).  Bladder infection.  Diverticulitis.  Miscarriage related to a uterine infection (septic abortion).  Bladder.  Inflammation of the bladder (cystitis).  Kidney stone(s).  Gastrointestinal.  Constipation.  Diverticulitis.  Neurologic.  Trauma.  Feeling pelvic pain because of mental or emotional causes (psychosomatic).  Cancers of the bowel or pelvis. EVALUATION  Your caregiver will want to take a careful history of your concerns. This includes recent changes in your health, a careful gynecologic history of your periods (menses), and a sexual history. Obtaining your family  history and medical history is also important. Your caregiver may suggest a pelvic exam. A pelvic exam will help identify the location and severity of the pain. It also helps in the evaluation of which organ system may be involved. In order to identify the cause of the pelvic pain and be properly treated, your caregiver may order tests. These tests may include:   A pregnancy test.  Pelvic ultrasonography.  An X-ray exam of the abdomen.  A urinalysis or evaluation of vaginal discharge.  Blood tests. HOME CARE INSTRUCTIONS   Only take over-the-counter or prescription medicines for pain, discomfort, or fever as directed by your caregiver.   Rest as directed by your caregiver.   Eat a balanced diet.   Drink enough fluids to make your urine clear or pale yellow, or as directed.   Avoid sexual intercourse if it causes pain.   Apply warm or cold compresses to the lower abdomen depending on which one helps the pain.   Avoid stressful situations.   Keep a journal of your pelvic pain. Write down when it started, where the pain is located, and if there are things that seem to be associated with the pain, such as food or your menstrual cycle.  Follow up with your caregiver as directed.  SEEK MEDICAL CARE IF:  Your medicine does not help your pain.  You have abnormal vaginal discharge. SEEK IMMEDIATE MEDICAL CARE IF:   You have heavy bleeding from the vagina.   Your pelvic pain increases.   You feel light-headed or faint.   You have chills.   You have pain with urination or blood in your urine.   You have uncontrolled diarrhea   or vomiting.   You have a fever or persistent symptoms for more than 3 days.  You have a fever and your symptoms suddenly get worse.   You are being physically or sexually abused.  MAKE SURE YOU:  Understand these instructions.  Will watch your condition.  Will get help if you are not doing well or get worse. Document Released:  09/10/2004 Document Revised: 02/28/2014 Document Reviewed: 02/03/2012 [employer] Patient Information 2015 [employer]. This information is not intended to replace advice given to you by your health care provider. Make sure you discuss any questions you have with your health care provider.  

## 2014-08-31 NOTE — Progress Notes (Signed)
Subjective:    Patient ID: Marie Torres, female    DOB: September 26, 1961, 53 y.o.   MRN: [id number]  Pelvic Pain The patient's primary symptoms include pelvic pain. The patient's pertinent negatives include no genital itching, genital lesions, genital odor, genital rash, missed menses, vaginal bleeding or vaginal discharge. This is a recurrent problem. The current episode started more than 1 month ago. The problem occurs intermittently. The problem has been unchanged. The pain is mild. The problem affects both (in the very center, over the uterus and bladder) sides. She is not pregnant. Pertinent negatives include no abdominal pain, anorexia, back pain, chills, constipation, diarrhea, discolored urine, dysuria, fever, flank pain, frequency, headaches, hematuria, joint pain, joint swelling, nausea, painful intercourse, rash, sore throat, urgency or vomiting. Nothing aggravates the symptoms. She has tried oral narcotics for the symptoms. The treatment provided moderate relief. She is not sexually active. No, her partner does not have an STD. She uses nothing for contraception. She is postmenopausal.      Review of Systems  Constitutional: Negative.  Negative for fever, chills, diaphoresis and fatigue.  HENT: Negative.  Negative for sore throat.   Eyes: Negative.   Respiratory: Negative.  Negative for cough, choking, chest tightness, shortness of breath and stridor.   Cardiovascular: Negative.  Negative for chest pain, palpitations and leg swelling.  Gastrointestinal: Negative.  Negative for nausea, vomiting, abdominal pain, diarrhea, constipation, blood in stool and anorexia.  Endocrine: Negative.   Genitourinary: Positive for pelvic pain. Negative for dysuria, urgency, frequency, hematuria, flank pain, decreased urine volume, vaginal bleeding, vaginal discharge, enuresis, difficulty urinating, genital sores, vaginal pain, menstrual problem, dyspareunia and missed menses.  Musculoskeletal: Negative.   Negative for back pain and joint pain.  Skin: Negative.  Negative for rash.  Allergic/Immunologic: Negative.   Neurological: Negative.  Negative for headaches.  Hematological: Negative.  Negative for adenopathy. Does not bruise/bleed easily.  Psychiatric/Behavioral: Negative.        Objective:   Physical Exam  Constitutional: She is oriented to person, place, and time. She appears well-developed and well-nourished. No distress.  HENT:  Head: Normocephalic and atraumatic.  Mouth/Throat: Oropharynx is clear and moist. No oropharyngeal exudate.  Eyes: Conjunctivae are normal. Right eye exhibits no discharge. Left eye exhibits no discharge. No scleral icterus.  Neck: Normal range of motion. Neck supple. No JVD present. No tracheal deviation present. No thyromegaly present.  Cardiovascular: Normal rate, normal heart sounds and intact distal pulses.  Exam reveals no gallop and no friction rub.   No murmur heard. Pulmonary/Chest: Effort normal and breath sounds normal. No stridor. No respiratory distress. She has no wheezes. She has no rales. She exhibits no tenderness.  Abdominal: Soft. Bowel sounds are normal. She exhibits no distension and no mass. There is no tenderness. There is no rebound and no guarding. Hernia confirmed negative in the right inguinal area and confirmed negative in the left inguinal area.  Genitourinary: Rectum normal, vagina normal and uterus normal. Rectal exam shows no external hemorrhoid, no internal hemorrhoid, no fissure, no mass, no tenderness and anal tone normal. Guaiac negative stool. No breast swelling, tenderness, discharge or bleeding. Pelvic exam was performed with patient supine. No labial fusion. There is no rash, tenderness, lesion or injury on the right labia. There is no rash, tenderness, lesion or injury on the left labia. Uterus is not deviated, not enlarged, not fixed and not tender. Cervix exhibits no motion tenderness, no discharge and no friability.  Right adnexum displays no mass, no  tenderness and no fullness. Left adnexum displays no mass, no tenderness and no fullness. No erythema, tenderness or bleeding in the vagina. No foreign body around the vagina. No signs of injury around the vagina. No vaginal discharge found.  Musculoskeletal: Normal range of motion. She exhibits no edema or tenderness.  Lymphadenopathy:    She has no cervical adenopathy.       Right: No inguinal adenopathy present.       Left: No inguinal adenopathy present.  Neurological: She is oriented to person, place, and time.  Skin: Skin is warm and dry. No rash noted. She is not diaphoretic. No erythema. No pallor.  Vitals reviewed.         Assessment & Plan:

## 2014-08-31 NOTE — Progress Notes (Signed)
Pre visit review using our clinic review tool, if applicable. No additional management support is needed unless otherwise documented below in the visit note. 

## 2014-08-31 NOTE — Assessment & Plan Note (Signed)
Her exam continues to be normal She had a CT abd/pelvis a few months ago that was normal She saw Dr. Brantley Stage (GS) 2 months ago, his noted indicate that he thought it was a muscle strain, she tried nsaids with no relief, his noted indicate that he wanted to see her back in three weeks to consider laparoscopy if the pain did not resolve. I will send her back to general surgery to consider further evaluation.

## 2014-09-05 ENCOUNTER — Telehealth: Payer: Self-pay | Admitting: Internal Medicine

## 2014-09-05 NOTE — Telephone Encounter (Signed)
Pt requesting a call regarding her pap results, I told her I was not sure they would be in yet.. Pt states she has been hurting in her privates for 2 months. Please advise.

## 2014-09-05 NOTE — Telephone Encounter (Signed)
Her PAP smear was normal in April 2015

## 2014-09-07 NOTE — Telephone Encounter (Deleted)
-----   Message from Lanelle Bal, [place] sent at 09/07/2014  9:13 AM EST ----- Regarding: appt status Pt c/o continued pain and request status of referral that was placed on 08/31/14. Patient would like a return call with information. Thanks

## 2014-09-07 NOTE — Telephone Encounter (Signed)
Patient aware she can call [facility] and schedule her own appointment. She is a current patient and does not need a referral.

## 2014-09-07 NOTE — Telephone Encounter (Signed)
-----   Message from Lanelle Bal, [place] sent at 09/07/2014  9:13 AM EST ----- Regarding: appt status Pt c/o continued pain and request status of referral that was placed on 08/31/14. Patient would like a return call with information. Thanks

## 2014-09-16 ENCOUNTER — Ambulatory Visit (INDEPENDENT_AMBULATORY_CARE_PROVIDER_SITE_OTHER): Payer: BC Managed Care – PPO | Admitting: Gynecology

## 2014-09-16 ENCOUNTER — Ambulatory Visit (INDEPENDENT_AMBULATORY_CARE_PROVIDER_SITE_OTHER): Payer: BC Managed Care – PPO

## 2014-09-16 ENCOUNTER — Encounter: Payer: Self-pay | Admitting: Gynecology

## 2014-09-16 VITALS — BP 130/88 | Ht 60.75 in | Wt 215.0 lb

## 2014-09-16 DIAGNOSIS — R52 Pain, unspecified: Secondary | ICD-10-CM

## 2014-09-16 DIAGNOSIS — R102 Pelvic and perineal pain: Secondary | ICD-10-CM

## 2014-09-16 DIAGNOSIS — N760 Acute vaginitis: Secondary | ICD-10-CM

## 2014-09-16 DIAGNOSIS — A499 Bacterial infection, unspecified: Secondary | ICD-10-CM

## 2014-09-16 DIAGNOSIS — B9689 Other specified bacterial agents as the cause of diseases classified elsewhere: Secondary | ICD-10-CM

## 2014-09-16 DIAGNOSIS — N3010 Interstitial cystitis (chronic) without hematuria: Secondary | ICD-10-CM | POA: Insufficient documentation

## 2014-09-16 LAB — WET PREP FOR TRICH, YEAST, CLUE
Trich, Wet Prep: NONE SEEN
WBC, Wet Prep HPF POC: NONE SEEN
Yeast Wet Prep HPF POC: NONE SEEN

## 2014-09-16 MED ORDER — CLINDAMYCIN PHOSPHATE 2 % VA CREA: 1.0000 | TOPICAL_CREAM | Freq: Every day | VAGINAL | Status: DC

## 2014-09-16 MED ORDER — PENTOSAN POLYSULFATE SODIUM 100 MG PO CAPS: 100.0000 mg | ORAL_CAPSULE | Freq: Three times a day (TID) | ORAL | Status: DC

## 2014-09-16 NOTE — Addendum Note (Signed)
Addended by: Thurnell Garbe A on: 09/16/2014 05:02 PM   Modules accepted: Orders

## 2014-09-16 NOTE — Patient Instructions (Addendum)
Interstitial Cystitis Interstitial cystitis (IC) is a condition that results in discomfort or pain in the bladder and the surrounding pelvic region. The symptoms can be different from case to case and even in the same individual. People may experience:  Mild discomfort.  Pressure.  Tenderness.  Intense pain in the bladder and pelvic area. CAUSES  Because IC varies so much in symptoms and severity, people studying this disease believe it is not one but several diseases. Some caregivers use the term painful bladder syndrome (PBS) to describe cases with painful urinary symptoms. This may not meet the strictest definition of IC. The term IC / PBS includes all cases of urinary pain that cannot be connected to other causes, such as infection or urinary stones.  SYMPTOMS  Symptoms may include:  An urgent need to urinate.  A frequent need to urinate.  A combination of these symptoms. Pain may change in intensity as the bladder fills with urine or as it empties. Women's symptoms often get worse during menstruation. They may sometimes experience pain with vaginal intercourse. Some of the symptoms of IC / PBS seem like those of bacterial infection. Tests do not show infection. IC / PBS is far more common in women than in men.  DIAGNOSIS  The diagnosis of IC / PBS is based on:  Presence of pain related to the bladder, usually along with problems of frequency and urgency.  Not finding other diseases that could cause the symptoms.  Diagnostic tests that help rule out other diseases include:  Urinalysis.  Urine culture.  Cystoscopy.  Biopsy of the bladder wall.  Distension of the bladder under anesthesia.  Urine cytology.  Laboratory examination of prostate secretions. A biopsy is a tissue sample that can be looked at under a microscope. Samples of the bladder and urethra may be removed during a cystoscopy. A biopsy helps rule out bladder cancer. TREATMENT  Scientists have not yet found  a cure for IC / PBS. Patients with IC / PBS do not get better with antibiotic therapy. Caregivers cannot predict who will respond best to which treatment. Symptoms may disappear without explanation. Disappearing symptoms may coincide with an event such as a change in diet or treatment. Even when symptoms disappear, they may return after days, weeks, months, or years.  Because the causes of IC / PBS are unknown, current treatments are aimed at relieving symptoms. Many people are helped by one or a combination of the treatments. As researchers learn more about IC / PBS, the list of potential treatments will change. Patients should discuss their options with a caregiver. SURGERY  Surgery should be considered only if all available treatments have failed and the pain is disabling. Many approaches and techniques are used. Each approach has its own advantages and complications. Advantages and complications should be discussed with a urologist. Your caregiver may recommend consulting another urologist for a second opinion. Most caregivers are reluctant to operate because the outcome is unpredictable. Some people still have symptoms after surgery.  People considering surgery should discuss the potential risks and benefits, side effects, and long- and short-term complications with their family, as well as with people who have already had the procedure. Surgery requires anesthesia, hospitalization, and in some cases weeks or months of recovery. As the complexity of the procedure increases, so do the chances for complications and for failure. HOME CARE INSTRUCTIONS   All drugs, even those sold over the counter, have side effects. Patients should always consult a caregiver before using any   drug for an extended amount of time. Only take over-the-counter or prescription medicines for pain, discomfort, or fever as directed by your caregiver.  Many patients feel that smoking makes their symptoms worse. How the by-products  of tobacco that are excreted in the urine affect IC / PBS is unknown. Smoking is the major known cause of bladder cancer. One of the best things smokers can do for their bladder and their overall health is to quit.  Many patients feel that gentle stretching exercises help relieve IC / PBS symptoms.  Methods vary, but basically patients decide to empty their bladder at designated times and use relaxation techniques and distractions to keep to the schedule. Gradually, patients try to lengthen the time between scheduled voids. A diary in which to record voiding times is usually helpful in keeping track of progress. MAKE SURE YOU:   Understand these instructions.  Will watch your condition.  Will get help right away if you are not doing well or get worse. Document Released: 06/14/2004 Document Revised: 01/06/2012 Document Reviewed: 08/29/2008 [facility] Patient Information 2015 [place], [place]. This information is not intended to replace advice given to you by your health care provider. Make sure you discuss any questions you have with your health care provider. Pentosan capsules What is this medicine? PENTOSAN (PEN toe san) is used to treat the bladder pain or discomfort caused by interstitial cystitis. This is a condition that causes inflammation of parts of the kidney. This medicine may be used for other purposes; ask your health care provider or pharmacist if you have questions. COMMON BRAND NAME(S): Elmiron What should I tell my health care provider before I take this medicine? They need to know if you have any of these conditions: -kidney or liver disease -an unusual or allergic reaction to pentosan, other medicines, foods, dyes, or preservatives -pregnant or trying to get pregnant -breast-feeding How should I use this medicine? Take this medicine by mouth with a glass of water. Follow the directions on the prescription label. Take this medicine on an empty stomach, at least 1 hour before  or 2 hours after food. Do not take with food. Take your doses at regular intervals. Do not take your medicine more often than directed. Talk to your pediatrician regarding the use of this medicine in children. Special care may be needed. Overdosage: If you think you have taken too much of this medicine contact a poison control center or emergency room at once. NOTE: This medicine is only for you. Do not share this medicine with others. What if I miss a dose? If you miss a dose, take it as soon as you can. If it is almost time for your next dose, take only that dose. Do not take double or extra doses. What may interact with this medicine? Do not take this medicine with any of the following medications: -agents that prevent or dissolve blood clots like heparin or warfarin -aspirin, especially in higher doses -mifepristone This medicine may also interact with the following medications: -clopidogrel -dipyridamole -NSAIDs, medicines for pain and inflammation, like ibuprofen or naproxen -ticlopidine This list may not describe all possible interactions. Give your health care provider a list of all the medicines, herbs, non-prescription drugs, or dietary supplements you use. Also tell them if you smoke, drink alcohol, or use illegal drugs. Some items may interact with your medicine. What should I watch for while using this medicine? Tell your doctor or health care professional that you are taking this medicine if you are  going to have a medical procedure or surgery. What side effects may I notice from receiving this medicine? Side effects that you should report to your doctor or health care professional as soon as possible: -allergic reactions like skin rash, itching or hives, swelling of the face, lips, or tongue -diarrhea -rash -signs and symptoms of bleeding such as bloody or black, tarry stools; red or dark-brown urine; spitting up blood or brown material that looks like coffee grounds; red spots  on the skin; unusual bruising or bleeding from the eye, gums, or nose -stomach pain Side effects that usually do not require medical attention (report to your doctor or health care professional if they continue or are bothersome): -hair loss -headache -nausea This list may not describe all possible side effects. Call your doctor for medical advice about side effects. You may report side effects to FDA at 1-800-FDA-1088. Where should I keep my medicine? Keep out of the reach of children. Store at room temperature between 15 and 30 degrees C (59 and 86 degrees F). Throw away any unused medicine after the expiration date. NOTE: This sheet is a summary. It may not cover all possible information. If you have questions about this medicine, talk to your doctor, pharmacist, or health care provider.  2015, Elsevier/Gold Standard. (2013-01-29 15:36:02)  Bacterial Vaginosis Bacterial vaginosis is a vaginal infection that occurs when the normal balance of bacteria in the vagina is disrupted. It results from an overgrowth of certain bacteria. This is the most common vaginal infection in women of childbearing age. Treatment is important to prevent complications, especially in pregnant women, as it can cause a premature delivery. CAUSES  Bacterial vaginosis is caused by an increase in harmful bacteria that are normally present in smaller amounts in the vagina. Several different kinds of bacteria can cause bacterial vaginosis. However, the reason that the condition develops is not fully understood. RISK FACTORS Certain activities or behaviors can put you at an increased risk of developing bacterial vaginosis, including:  Having a new sex partner or multiple sex partners.  Douching.  Using an intrauterine device (IUD) for contraception. Women do not get bacterial vaginosis from toilet seats, bedding, swimming pools, or contact with objects around them. SIGNS AND SYMPTOMS  Some women with bacterial  vaginosis have no signs or symptoms. Common symptoms include:  Grey vaginal discharge.  A fishlike odor with discharge, especially after sexual intercourse.  Itching or burning of the vagina and vulva.  Burning or pain with urination. DIAGNOSIS  Your health care provider will take a medical history and examine the vagina for signs of bacterial vaginosis. A sample of vaginal fluid may be taken. Your health care provider will look at this sample under a microscope to check for bacteria and abnormal cells. A vaginal pH test may also be done.  TREATMENT  Bacterial vaginosis may be treated with antibiotic medicines. These may be given in the form of a pill or a vaginal cream. A second round of antibiotics may be prescribed if the condition comes back after treatment.  HOME CARE INSTRUCTIONS   Only take over-the-counter or prescription medicines as directed by your health care provider.  If antibiotic medicine was prescribed, take it as directed. Make sure you finish it even if you start to feel better.  Do not have sex until treatment is completed.  Tell all sexual partners that you have a vaginal infection. They should see their health care provider and be treated if they have problems, such as a  mild rash or itching.  Practice safe sex by using condoms and only having one sex partner. SEEK MEDICAL CARE IF:   Your symptoms are not improving after 3 days of treatment.  You have increased discharge or pain.  You have a fever. MAKE SURE YOU:   Understand these instructions.  Will watch your condition.  Will get help right away if you are not doing well or get worse. FOR MORE INFORMATION  Centers for Disease Control and Prevention, Division of STD Prevention: [employer]raud[employer] American Sexual Health Association (ASHA): www.ashastd.org  Document Released: 10/14/2005 Document Revised: 08/04/2013 Document Reviewed: 05/26/2013 [facility] Patient Information 2015 [place], [place]. This  information is not intended to replace advice given to you by your health care provider. Make sure you discuss any questions you have with your health care provider.

## 2014-09-16 NOTE — Progress Notes (Addendum)
   Patient is a 53 year old gravida 1 para 1 (normal spontaneous vaginal delivery) that was referred to our practice as a courtesy of Dr. Brantley Stage general surgeon who had been evaluating the patient for wide was  suspected by her primary physician (Dr. Scarlette Calico) to have been a possible inguinal hernia. Patient had a CT of the abdomen and pelvis with contrast on 09/09/2014 and the impression was:   Normal appendix, no acute inflammation, left colon diverticulosis, aortic iliac calcified atherosclerosis and the rest of the abdominal pelvic CT scan was reported to be normal.  It was described by previous providers this noted above this has been a recurrent problem. On further questioning the patient appears that her pains are more common suprapubically especially when her bladder is full and she needs to go right away or if not she would leak urine. She feels relieved after voiding. She stated that she has not been sexually active since March of this year. At other times she denies any stress urinary incontinence or any nocturia.  Patient has had a normal colonoscopy report in 2015 she's had several years ago history of colon polyps and is on a 5 year cycle recall Patient had a normal Pap smear this year. Patient had a normal mammogram this year.  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  Patient was complaining also some slight vaginal pruritus and burning sensation in her vagina. Patient reached the menopause at the age of 51 has had very infrequent vasomotor symptoms and was never on a hormonal replacement therapy.  Exam: Back: No CVA tenderness Abdomen: Soft nontender no rebound or guarding Leg raising  at 30 degree elicited no pain Negative Rovsing, negative she'll TAB, negative psoas sign Patient was examined in the supine and erect position there was no inguinal or femoral hernia palpated. Pelvic  exam: Bartholin urethra Skene was within normal limits Vagina Fish like odor was noted Speculum exam no gross lesions otherwise on inspection of the vagina or cervix Bimanual exam slight suprapubic tenderness but no rebound or guarding adnexa difficult to assess due to patient's morbid obesity Rectovaginal exam otherwise unremarkable  Wet prep: Amine positive, few clue cells, many bacteria GYN pelvic ultrasound: Uterus measured 5.3 x 3.8 x 2.7 cm with normal endometrial stripe of 4.3 mm. No uterine abnormalities were noted. Left ovary normal. Right ovary not seen but adnexa is normal. No free fluid seen   Assessment/plan: Based on patient's history there is a high probability that she may have interstitial cystitis. We are going to check a urinalysis today. We are tentatively going to give her a trial of Elmiron 100 mg 3 times a day and see how she does over the course of the next monthFor her bacterial vaginosis she will be placed on Cleocin vaginal cream to apply daily at bedtime for one week. Patient will return back in 3 weeks for follow-up. If the medication does not work we want to refer her to the urologist for further evaluation. As a last resort would be diagnostic laparoscopy.Marland Kitchen

## 2014-09-16 NOTE — Addendum Note (Signed)
Addended by: Terrance Mass on: 09/16/2014 03:44 PM   Modules accepted: Level of Service

## 2014-09-20 ENCOUNTER — Telehealth: Payer: Self-pay | Admitting: *Deleted

## 2014-09-20 NOTE — Telephone Encounter (Signed)
Pt was seen on 09/16/14 given Rx for trial of Elmiron 100 mg 3 times a day. Pt took medication and c/o bad heartburn, vision problems so pt stopped Rx. Asked if you want her to try another Rx? Please advise

## 2014-09-20 NOTE — Telephone Encounter (Signed)
Left the below on pt voicemail and told her I will call her with time and date with appointment.

## 2014-09-20 NOTE — Telephone Encounter (Signed)
Please have her stop the medication and make an appointment for her at [facility] urology

## 2014-09-26 NOTE — Telephone Encounter (Signed)
Referral form filled out and faxed to [facility] they will fax me time and date of appointment.

## 2014-09-28 NOTE — Telephone Encounter (Signed)
Appointment on 10/27/14 @ 2:30 pm with Dr.Herrick, left message for pt to call

## 2014-09-30 NOTE — Telephone Encounter (Signed)
Left message for pt to cal

## 2014-09-30 NOTE — Telephone Encounter (Signed)
Pt informed by claudia. 

## 2014-10-07 ENCOUNTER — Ambulatory Visit: Payer: Self-pay | Admitting: Gynecology

## 2014-10-12 ENCOUNTER — Ambulatory Visit (INDEPENDENT_AMBULATORY_CARE_PROVIDER_SITE_OTHER): Payer: BC Managed Care – PPO | Admitting: Gynecology

## 2014-10-12 ENCOUNTER — Encounter: Payer: Self-pay | Admitting: Gynecology

## 2014-10-12 VITALS — BP 126/80

## 2014-10-12 DIAGNOSIS — N3010 Interstitial cystitis (chronic) without hematuria: Secondary | ICD-10-CM

## 2014-10-12 NOTE — Patient Instructions (Signed)

## 2014-10-12 NOTE — Progress Notes (Signed)
   Patient 53 year old who presented to the office today for follow-up. Patient was seen in the office as a new patient in November 20 she had been referred to our practice as a courtesy of Dr. Brantley Stage general surgeon who had been evaluating the patient for wide was suspected by her primary physician (Dr. Scarlette Calico) to have been a possible inguinal hernia. Patient had a CT of the abdomen and pelvis with contrast on 09/09/2014 and the impression was:  Normal appendix, no acute inflammation, left colon diverticulosis, aortic iliac calcified atherosclerosis and the rest of the abdominal pelvic CT scan was reported to be normal.  It was described by previous providers this noted above this has been a recurrent problem. On further questioning the patient appears that her pains are more common suprapubically especially when her bladder is full and she needs to go right away or if not she would leak urine. She feels relieved after voiding. She stated that she has not been sexually active since March of this year. At other times she denies any stress urinary incontinence or any nocturia.  Patient has had a normal colonoscopy report in 2015 she's had several years ago history of colon polyps and is on a 5 year cycle recall Patient had a normal Pap smear this year. Patient had a normal mammogram this year.  Patient had mild dysplasia in her 11s and had some form of cryotherapy but subsequent Pap smears of been reported to be normal.  Patient was complaining also some slight vaginal pruritus and burning sensation in her vagina. Patient reached the menopause at the age of 6 has had very infrequent vasomotor symptoms and was never on a hormonal replacement therapy.  On the last office visit she had a normal gynecological exam and was treated for bacterial vaginosis with Cleocin vaginal cream daily at bedtime for one week. She had also had an ultrasound which had demonstrated the uterus to measure 5.3 x 3.8 x  2.7 cm with normal endometrial stripe of 4.3 mm. No uterine abnormalities were noted. Left ovary normal. Right ovary not seen but adnexa is normal. No free fluid seen  Based on the above history it was highly suspicious that patient had underlying interstitial cystitis and was started on Elmiron 100 mg daily which she stated that only could take for 2 days because of dyspepsia and midepigastric discomfort. She states her suprapubic discomfort has improved tremendously. We are going to put her on interstitial cystitis diet protocol and monitor her symptoms over the next few months. She scheduled to see me in April for her annual exam. If her symptoms do not continue to improve I'm going to refer her to the gastroenterologist. She denies any dysuria, nocturia or any stress urinary incontinence.

## 2015-01-21 ENCOUNTER — Other Ambulatory Visit: Payer: Self-pay | Admitting: Gastroenterology

## 2015-01-24 ENCOUNTER — Other Ambulatory Visit: Payer: Self-pay | Admitting: Gastroenterology

## 2015-01-26 ENCOUNTER — Other Ambulatory Visit: Payer: Self-pay | Admitting: Gastroenterology

## 2015-01-27 ENCOUNTER — Other Ambulatory Visit: Payer: Self-pay | Admitting: Gastroenterology

## 2015-02-01 ENCOUNTER — Ambulatory Visit (INDEPENDENT_AMBULATORY_CARE_PROVIDER_SITE_OTHER): Payer: BLUE CROSS/BLUE SHIELD | Admitting: Gynecology

## 2015-02-01 ENCOUNTER — Encounter: Payer: Self-pay | Admitting: Gynecology

## 2015-02-01 VITALS — BP 124/82 | Ht 60.5 in | Wt 200.0 lb

## 2015-02-01 DIAGNOSIS — Z780 Asymptomatic menopausal state: Secondary | ICD-10-CM | POA: Diagnosis not present

## 2015-02-01 DIAGNOSIS — Z1159 Encounter for screening for other viral diseases: Secondary | ICD-10-CM

## 2015-02-01 DIAGNOSIS — Z01419 Encounter for gynecological examination (general) (routine) without abnormal findings: Secondary | ICD-10-CM

## 2015-02-01 NOTE — Progress Notes (Signed)
Marie Torres 18-Apr-1961 [id number]   History:    54 y.o.  for annual gyn exam who was seen in the office as a new patient for the first time last year. See previous note dated November 20 and December 2015 respectively. Patient no longer taking Elmiron for suspected interstitial cystitis. She is totally asymptomatic she is exercising has lost 15 pounds in the past 5 months. She had a normal ultrasound last year here in our office. Patient stated that she had a normal colonoscopy in 2015 but several years prior she had benign colon polyps and thus she is on a 5 year recall. She states that she is always had mild dysplasia in her 39s and had cryotherapy and subsequent patches of been normal. Pap smear last year was normal. Patient recently menopause at the age of 27 has never been on hormone replacement therapy she reports very vague vasomotor symptoms not interested in any hormone replacement therapy currently not sexually active denies any vaginal bleeding. She has not had a bone density study done as of yet.   Past medical history,surgical history, family history and social history were all reviewed and documented in the EPIC chart.  Gynecologic History No LMP recorded. Patient is postmenopausal. Contraception: post menopausal status Last Pap: 2015. Results were: normal Last mammogram: 2015. Results were: normal  Obstetric History OB History  Gravida Para Term Preterm AB SAB TAB Ectopic Multiple Living  1 1 1       1     # Outcome Date GA Lbr Len/2nd Weight Sex Delivery Anes PTL Lv  1 Term      Vag-Spont          ROS: A ROS was performed and pertinent positives and negatives are included in the history.  GENERAL: No fevers or chills. HEENT: No change in vision, no earache, sore throat or sinus congestion. NECK: No pain or stiffness. CARDIOVASCULAR: No chest pain or pressure. No palpitations. PULMONARY: No shortness of breath, cough or wheeze. GASTROINTESTINAL: No abdominal pain,  nausea, vomiting or diarrhea, melena or bright red blood per rectum. GENITOURINARY: No urinary frequency, urgency, hesitancy or dysuria. MUSCULOSKELETAL: No joint or muscle pain, no back pain, no recent trauma. DERMATOLOGIC: No rash, no itching, no lesions. ENDOCRINE: No polyuria, polydipsia, no heat or cold intolerance. No recent change in weight. HEMATOLOGICAL: No anemia or easy bruising or bleeding. NEUROLOGIC: No headache, seizures, numbness, tingling or weakness. PSYCHIATRIC: No depression, no loss of interest in normal activity or change in sleep pattern.     Exam: chaperone present  BP 124/82 mmHg  Ht 5' 0.5" (1.537 m)  Wt 200 lb (90.719 kg)  BMI 38.40 kg/m2  Body mass index is 38.4 kg/(m^2).  General appearance : Well developed well nourished female. No acute distress HEENT: Eyes: no retinal hemorrhage or exudates,  Neck supple, trachea midline, no carotid bruits, no thyroidmegaly Lungs: Clear to auscultation, no rhonchi or wheezes, or rib retractions  Heart: Regular rate and rhythm, no murmurs or gallops Breast:Examined in sitting and supine position were symmetrical in appearance, no palpable masses or tenderness,  no skin retraction, no nipple inversion, no nipple discharge, no skin discoloration, no axillary or supraclavicular lymphadenopathy Abdomen: no palpable masses or tenderness, no rebound or guarding Extremities: no edema or skin discoloration or tenderness  Pelvic:  Bartholin, Urethra, Skene Glands: Within normal limits             Vagina: No gross lesions or discharge  Cervix: No gross lesions or  discharge  Uterus  anteverted, normal size, shape and consistency, non-tender and mobile  Adnexa  Without masses or tenderness  Anus and perineum  normal   Rectovaginal refused             Hemoccult cards provided     Assessment/Plan:  54 y.o. female for annual exam who is menopausal with vague menopausal symptoms not interested normal replacement therapy. We we  discussed using peppermint oil to apply 2 dabs behind each ear at that time or when necessary. She will schedule here in the office in the next few weeks to have a fasting screening blood work to include the following fasting lipid profile, comprehensive metabolic panel, TSH, CBC, and urinalysis along with her bone density study. She was instructed to bring with her Hemoccult cards for testing. We discussed importance of calcium vitamin D and regular exercise for osteoporosis prevention.   New CDC guidelines is recommending patients be tested once in her lifetime for hepatitis C antibody who were born between [birth year] through 1967. This was discussed with the patient today and has agreed to be tested today.  Terrance Mass MD, 4:24 PM 02/01/2015

## 2015-02-01 NOTE — Patient Instructions (Signed)

## 2015-02-03 IMAGING — CR DG CHEST 2V
2 series · 2 of 2 positions shown · non-contrast
Comparison: Chest radiograph 06/21/2013

CLINICAL DATA: Productive cough for 2 weeks

EXAM:
CHEST  2 VIEW

[view not recorded (1 of 2)]
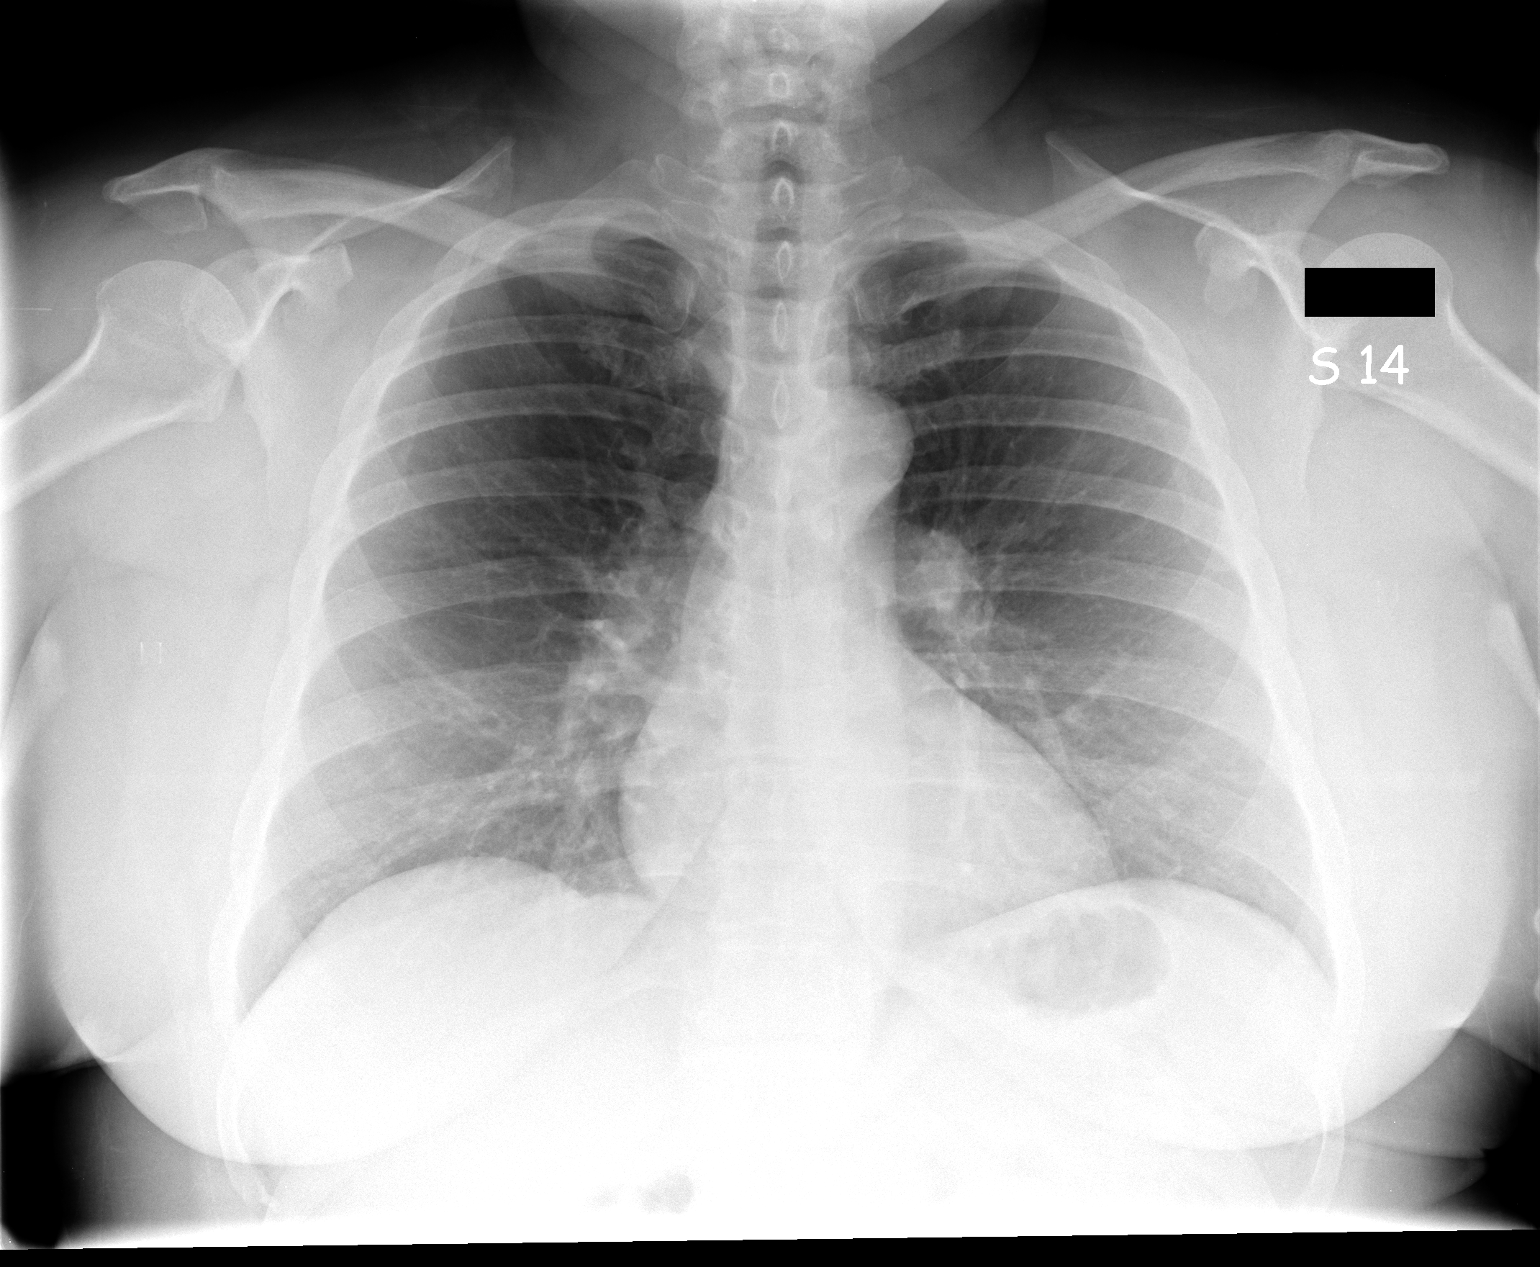

[view not recorded (2 of 2)]
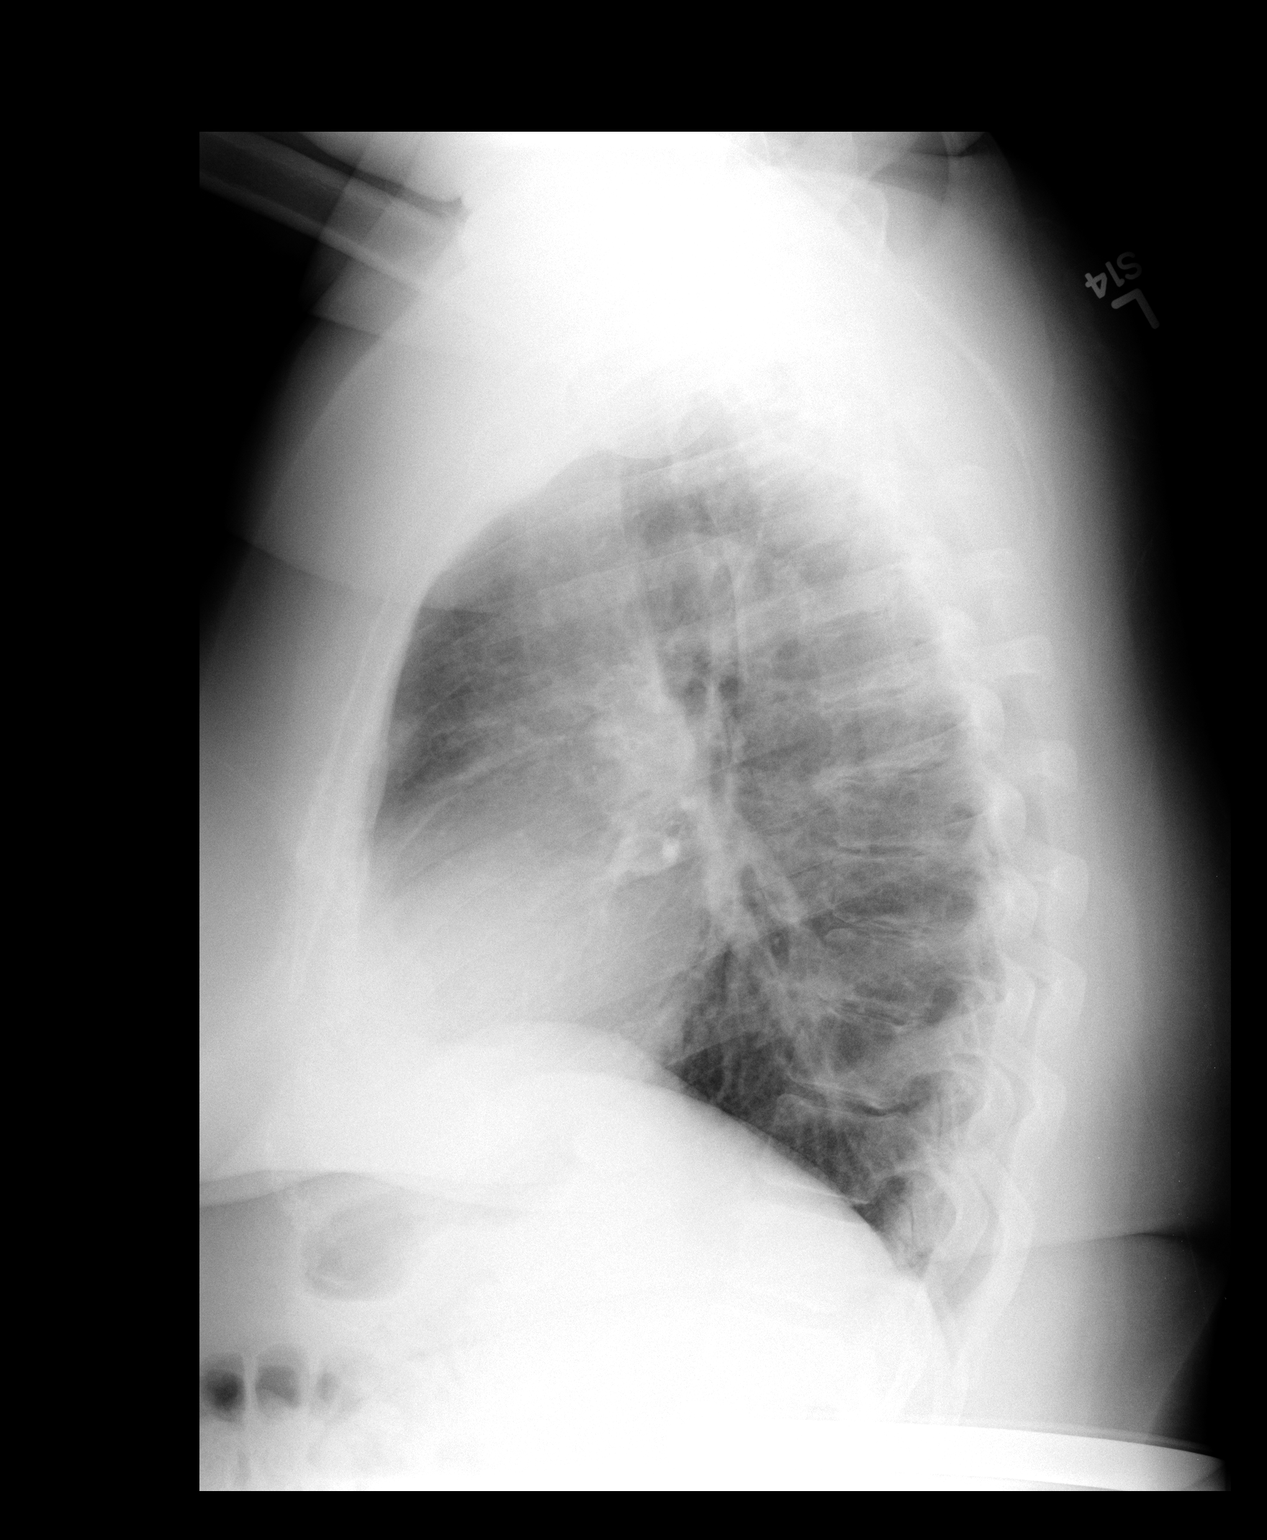

[2 of 2 positions shown; findings below may reference images not displayed]

FINDINGS: The heart size and mediastinal contours are within normal limits.
Both lungs are clear. The visualized skeletal structures are
unremarkable.
IMPRESSION: No active cardiopulmonary disease.

## 2015-02-09 ENCOUNTER — Ambulatory Visit (INDEPENDENT_AMBULATORY_CARE_PROVIDER_SITE_OTHER): Payer: BLUE CROSS/BLUE SHIELD

## 2015-02-09 ENCOUNTER — Other Ambulatory Visit: Payer: Self-pay | Admitting: Gynecology

## 2015-02-09 ENCOUNTER — Other Ambulatory Visit: Payer: BLUE CROSS/BLUE SHIELD

## 2015-02-09 DIAGNOSIS — Z13820 Encounter for screening for osteoporosis: Secondary | ICD-10-CM

## 2015-02-09 DIAGNOSIS — Z780 Asymptomatic menopausal state: Secondary | ICD-10-CM | POA: Diagnosis not present

## 2015-02-09 DIAGNOSIS — Z01419 Encounter for gynecological examination (general) (routine) without abnormal findings: Secondary | ICD-10-CM

## 2015-02-09 LAB — COMPREHENSIVE METABOLIC PANEL
ALK PHOS: 63 U/L (ref 39–117)
ALT: 14 U/L (ref 0–35)
AST: 20 U/L (ref 0–37)
Albumin: 4 g/dL (ref 3.5–5.2)
BILIRUBIN TOTAL: 0.6 mg/dL (ref 0.2–1.2)
BUN: 16 mg/dL (ref 6–23)
CALCIUM: 9.5 mg/dL (ref 8.4–10.5)
CO2: 25 mEq/L (ref 19–32)
Chloride: 106 mEq/L (ref 96–112)
Creat: 0.78 mg/dL (ref 0.50–1.10)
Glucose, Bld: 95 mg/dL (ref 70–99)
Potassium: 4.6 mEq/L (ref 3.5–5.3)
Sodium: 138 mEq/L (ref 135–145)
Total Protein: 7.3 g/dL (ref 6.0–8.3)

## 2015-02-09 LAB — CBC WITH DIFFERENTIAL/PLATELET
BASOS PCT: 0 % (ref 0–1)
Basophils Absolute: 0 10*3/uL (ref 0.0–0.1)
EOS ABS: 0.2 10*3/uL (ref 0.0–0.7)
Eosinophils Relative: 3 % (ref 0–5)
HCT: 42.1 % (ref 36.0–46.0)
Hemoglobin: 13.8 g/dL (ref 12.0–15.0)
Lymphocytes Relative: 37 % (ref 12–46)
Lymphs Abs: 2.4 10*3/uL (ref 0.7–4.0)
MCH: 27.8 pg (ref 26.0–34.0)
MCHC: 32.8 g/dL (ref 30.0–36.0)
MCV: 84.7 fL (ref 78.0–100.0)
MPV: 10.4 fL (ref 8.6–12.4)
Monocytes Absolute: 0.5 10*3/uL (ref 0.1–1.0)
Monocytes Relative: 7 % (ref 3–12)
NEUTROS ABS: 3.4 10*3/uL (ref 1.7–7.7)
NEUTROS PCT: 53 % (ref 43–77)
Platelets: 371 10*3/uL (ref 150–400)
RBC: 4.97 MIL/uL (ref 3.87–5.11)
RDW: 13.3 % (ref 11.5–15.5)
WBC: 6.5 10*3/uL (ref 4.0–10.5)

## 2015-02-09 LAB — LIPID PANEL
Cholesterol: 223 mg/dL — ABNORMAL HIGH (ref 0–200)
HDL: 51 mg/dL (ref >=46)
LDL CALC: 147 mg/dL — AB (ref 0–99)
Total CHOL/HDL Ratio: 4.4 Ratio
Triglycerides: 127 mg/dL (ref <150)
VLDL: 25 mg/dL (ref 0–40)

## 2015-02-09 LAB — TSH: TSH: 0.805 u[IU]/mL (ref 0.350–4.500)

## 2015-02-10 LAB — URINALYSIS W MICROSCOPIC + REFLEX CULTURE
Bacteria, UA: NONE SEEN
Bilirubin Urine: NEGATIVE
Casts: NONE SEEN
Crystals: NONE SEEN
Glucose, UA: NEGATIVE mg/dL
HGB URINE DIPSTICK: NEGATIVE
KETONES UR: NEGATIVE mg/dL
LEUKOCYTES UA: NEGATIVE
Nitrite: NEGATIVE
Protein, ur: NEGATIVE mg/dL
SPECIFIC GRAVITY, URINE: 1.024 (ref 1.005–1.030)
Urobilinogen, UA: 0.2 mg/dL (ref 0.0–1.0)
pH: 5.5 (ref 5.0–8.0)

## 2015-02-10 LAB — HEPATITIS C ANTIBODY: HCV Ab: NEGATIVE

## 2015-02-20 ENCOUNTER — Other Ambulatory Visit: Payer: Self-pay | Admitting: Anesthesiology

## 2015-02-20 DIAGNOSIS — E7800 Pure hypercholesterolemia, unspecified: Secondary | ICD-10-CM

## 2015-04-18 ENCOUNTER — Emergency Department (HOSPITAL_COMMUNITY)
Admission: EM | Admit: 2015-04-18 | Discharge: 2015-04-18 | Disposition: A | Discharge status: Home / Self Care | Payer: BLUE CROSS/BLUE SHIELD | Source: Home / Self Care | Attending: Family Medicine | Admitting: Family Medicine

## 2015-04-18 ENCOUNTER — Encounter (HOSPITAL_COMMUNITY): Payer: Self-pay | Admitting: Emergency Medicine

## 2015-04-18 DIAGNOSIS — H8110 Benign paroxysmal vertigo, unspecified ear: Secondary | ICD-10-CM | POA: Diagnosis not present

## 2015-04-18 DIAGNOSIS — J9801 Acute bronchospasm: Secondary | ICD-10-CM | POA: Diagnosis not present

## 2015-04-18 DIAGNOSIS — J069 Acute upper respiratory infection, unspecified: Secondary | ICD-10-CM

## 2015-04-18 MED ORDER — IPRATROPIUM BROMIDE 0.06 % NA SOLN: 2.0000 | Freq: Four times a day (QID) | NASAL | Status: DC

## 2015-04-18 MED ORDER — PREDNISONE 20 MG PO TABS: ORAL_TABLET | ORAL | Status: DC

## 2015-04-18 MED ORDER — MECLIZINE HCL 25 MG PO TABS: ORAL_TABLET | ORAL | Status: DC

## 2015-04-18 MED ORDER — ALBUTEROL SULFATE HFA 108 (90 BASE) MCG/ACT IN AERS: 2.0000 | INHALATION_SPRAY | RESPIRATORY_TRACT | Status: DC | PRN

## 2015-04-18 NOTE — Discharge Instructions (Signed)
Benign Positional Vertigo Vertigo means you feel like you or your surroundings are moving when they are not. Benign positional vertigo is the most common form of vertigo. Benign means that the cause of your condition is not serious. Benign positional vertigo is more common in older adults. CAUSES  Benign positional vertigo is the result of an upset in the labyrinth system. This is an area in the middle ear that helps control your balance. This may be caused by a viral infection, head injury, or repetitive motion. However, often no specific cause is found. SYMPTOMS  Symptoms of benign positional vertigo occur when you move your head or eyes in different directions. Some of the symptoms may include:  Loss of balance and falls.  Vomiting.  Blurred vision.  Dizziness.  Nausea.  Involuntary eye movements (nystagmus). DIAGNOSIS  Benign positional vertigo is usually diagnosed by physical exam. If the specific cause of your benign positional vertigo is unknown, your caregiver may perform imaging tests, such as magnetic resonance imaging (MRI) or computed tomography (CT). TREATMENT  Your caregiver may recommend movements or procedures to correct the benign positional vertigo. Medicines such as meclizine, benzodiazepines, and medicines for nausea may be used to treat your symptoms. In rare cases, if your symptoms are caused by certain conditions that affect the inner ear, you may need surgery. HOME CARE INSTRUCTIONS   Follow your caregiver's instructions.  Move slowly. Do not make sudden body or head movements.  Avoid driving.  Avoid operating heavy machinery.  Avoid performing any tasks that would be dangerous to you or others during a vertigo episode.  Drink enough fluids to keep your urine clear or pale yellow. SEEK IMMEDIATE MEDICAL CARE IF:   You develop problems with walking, weakness, numbness, or using your arms, hands, or legs.  You have difficulty speaking.  You develop  severe headaches.  Your nausea or vomiting continues or gets worse.  You develop visual changes.  Your family or friends notice any behavioral changes.  Your condition gets worse.  You have a fever.  You develop a stiff neck or sensitivity to light. MAKE SURE YOU:   Understand these instructions.  Will watch your condition.  Will get help right away if you are not doing well or get worse. Document Released: 07/22/2006 Document Revised: 01/06/2012 Document Reviewed: 07/04/2011 [facility] Patient Information 2015 [place], [place]. This information is not intended to replace advice given to you by your health care provider. Make sure you discuss any questions you have with your health care provider.  Bronchospasm A bronchospasm is a spasm or tightening of the airways going into the lungs. During a bronchospasm breathing becomes more difficult because the airways get smaller. When this happens there can be coughing, a whistling sound when breathing (wheezing), and difficulty breathing. Bronchospasm is often associated with asthma, but not all patients who experience a bronchospasm have asthma. CAUSES  A bronchospasm is caused by inflammation or irritation of the airways. The inflammation or irritation may be triggered by:   Allergies (such as to animals, pollen, food, or mold). Allergens that cause bronchospasm may cause wheezing immediately after exposure or many hours later.   Infection. Viral infections are believed to be the most common cause of bronchospasm.   Exercise.   Irritants (such as pollution, cigarette smoke, strong odors, aerosol sprays, and paint fumes).   Weather changes. Winds increase molds and pollens in the air. Rain refreshes the air by washing irritants out. Cold air may cause inflammation.   Stress  and emotional upset.  SIGNS AND SYMPTOMS   Wheezing.   Excessive nighttime coughing.   Frequent or severe coughing with a simple cold.   Chest  tightness.   Shortness of breath.  DIAGNOSIS  Bronchospasm is usually diagnosed through a history and physical exam. Tests, such as chest X-rays, are sometimes done to look for other conditions. TREATMENT   Inhaled medicines can be given to open up your airways and help you breathe. The medicines can be given using either an inhaler or a nebulizer machine.  Corticosteroid medicines may be given for severe bronchospasm, usually when it is associated with asthma. HOME CARE INSTRUCTIONS   Always have a plan prepared for seeking medical care. Know when to call your health care provider and local emergency services (911 in the [place] Know where you can access local emergency care.  Only take medicines as directed by your health care provider.  If you were prescribed an inhaler or nebulizer machine, ask your health care provider to explain how to use it correctly. Always use a spacer with your inhaler if you were given one.  It is necessary to remain calm during an attack. Try to relax and breathe more slowly.  Control your home environment in the following ways:   Change your heating and air conditioning filter at least once a month.   Limit your use of fireplaces and wood stoves.  Do not smoke and do not allow smoking in your home.   Avoid exposure to perfumes and fragrances.   Get rid of pests (such as roaches and mice) and their droppings.   Throw away plants if you see mold on them.   Keep your house clean and dust free.   Replace carpet with wood, tile, or vinyl flooring. Carpet can trap dander and dust.   Use allergy-proof pillows, mattress covers, and box spring covers.   Wash bed sheets and blankets every week in hot water and dry them in a dryer.   Use blankets that are made of polyester or cotton.   Wash hands frequently. SEEK MEDICAL CARE IF:   You have muscle aches.   You have chest pain.   The sputum changes from clear or white to yellow,  green, gray, or bloody.   The sputum you cough up gets thicker.   There are problems that may be related to the medicine you are given, such as a rash, itching, swelling, or trouble breathing.  SEEK IMMEDIATE MEDICAL CARE IF:   You have worsening wheezing and coughing even after taking your prescribed medicines.   You have increased difficulty breathing.   You develop severe chest pain. MAKE SURE YOU:   Understand these instructions.  Will watch your condition.  Will get help right away if you are not doing well or get worse. Document Released: 10/17/2003 Document Revised: 10/19/2013 Document Reviewed: 04/05/2013 [facility] Patient Information 2015 [place], [place]. This information is not intended to replace advice given to you by your health care provider. Make sure you discuss any questions you have with your health care provider.  How to Use an Inhaler Using your inhaler correctly is very important. Good technique will make sure that the medicine reaches your lungs.  HOW TO USE AN INHALER:  Take the cap off the inhaler.  If this is the first time using your inhaler, you need to prime it. Shake the inhaler for 5 seconds. Release four puffs into the air, away from your face. Ask your doctor for help if  you have questions.  Shake the inhaler for 5 seconds.  Turn the inhaler so the bottle is above the mouthpiece.  Put your pointer finger on top of the bottle. Your thumb holds the bottom of the inhaler.  Open your mouth.  Either hold the inhaler away from your mouth (the width of 2 fingers) or place your lips tightly around the mouthpiece. Ask your doctor which way to use your inhaler.  Breathe out as much air as possible.  Breathe in and push down on the bottle 1 time to release the medicine. You will feel the medicine go in your mouth and throat.  Continue to take a deep breath in very slowly. Try to fill your lungs.  After you have breathed in completely, hold your  breath for 10 seconds. This will help the medicine to settle in your lungs. If you cannot hold your breath for 10 seconds, hold it for as long as you can before you breathe out.  Breathe out slowly, through pursed lips. Whistling is an example of pursed lips.  If your doctor has told you to take more than 1 puff, wait at least 15-30 seconds between puffs. This will help you get the best results from your medicine. Do not use the inhaler more than your doctor tells you to.  Put the cap back on the inhaler.  Follow the directions from your doctor or from the inhaler package about cleaning the inhaler. If you use more than one inhaler, ask your doctor which inhalers to use and what order to use them in. Ask your doctor to help you figure out when you will need to refill your inhaler.  If you use a steroid inhaler, always rinse your mouth with water after your last puff, gargle and spit out the water. Do not swallow the water. GET HELP IF:  The inhaler medicine only partially helps to stop wheezing or shortness of breath.  You are having trouble using your inhaler.  You have some increase in thick spit (phlegm). GET HELP RIGHT AWAY IF:  The inhaler medicine does not help your wheezing or shortness of breath or you have tightness in your chest.  You have dizziness, headaches, or fast heart rate.  You have chills, fever, or night sweats.  You have a large increase of thick spit, or your thick spit is bloody. MAKE SURE YOU:   Understand these instructions.  Will watch your condition.  Will get help right away if you are not doing well or get worse. Document Released: 07/23/2008 Document Revised: 08/04/2013 Document Reviewed: 05/13/2013 [facility] Patient Information 2015 [place], [place]. This information is not intended to replace advice given to you by your health care provider. Make sure you discuss any questions you have with your health care provider.  Upper Respiratory Infection,  Adult Continue with the Certrizine May add Chlor trimeton 2 mg every 4 hours for severe drainage if needed An upper respiratory infection (URI) is also sometimes known as the common cold. The upper respiratory tract includes the nose, sinuses, throat, trachea, and bronchi. Bronchi are the airways leading to the lungs. Most people improve within 1 week, but symptoms can last up to 2 weeks. A residual cough may last even longer.  CAUSES Many different viruses can infect the tissues lining the upper respiratory tract. The tissues become irritated and inflamed and often become very moist. Mucus production is also common. A cold is contagious. You can easily spread the virus to others by oral contact.  This includes kissing, sharing a glass, coughing, or sneezing. Touching your mouth or nose and then touching a surface, which is then touched by another person, can also spread the virus. SYMPTOMS  Symptoms typically develop 1 to 3 days after you come in contact with a cold virus. Symptoms vary from person to person. They may include:  Runny nose.  Sneezing.  Nasal congestion.  Sinus irritation.  Sore throat.  Loss of voice (laryngitis).  Cough.  Fatigue.  Muscle aches.  Loss of appetite.  Headache.  Low-grade fever. DIAGNOSIS  You might diagnose your own cold based on familiar symptoms, since most people get a cold 2 to 3 times a year. Your caregiver can confirm this based on your exam. Most importantly, your caregiver can check that your symptoms are not due to another disease such as strep throat, sinusitis, pneumonia, asthma, or epiglottitis. Blood tests, throat tests, and X-rays are not necessary to diagnose a common cold, but they may sometimes be helpful in excluding other more serious diseases. Your caregiver will decide if any further tests are required. RISKS AND COMPLICATIONS  You may be at risk for a more severe case of the common cold if you smoke cigarettes, have chronic  heart disease (such as heart failure) or lung disease (such as asthma), or if you have a weakened immune system. The very young and very old are also at risk for more serious infections. Bacterial sinusitis, middle ear infections, and bacterial pneumonia can complicate the common cold. The common cold can worsen asthma and chronic obstructive pulmonary disease (COPD). Sometimes, these complications can require emergency medical care and may be life-threatening. PREVENTION  The best way to protect against getting a cold is to practice good hygiene. Avoid oral or hand contact with people with cold symptoms. Wash your hands often if contact occurs. There is no clear evidence that vitamin C, vitamin E, echinacea, or exercise reduces the chance of developing a cold. However, it is always recommended to get plenty of rest and practice good nutrition. TREATMENT  Treatment is directed at relieving symptoms. There is no cure. Antibiotics are not effective, because the infection is caused by a virus, not by bacteria. Treatment may include:  Increased fluid intake. Sports drinks offer valuable electrolytes, sugars, and fluids.  Breathing heated mist or steam (vaporizer or shower).  Eating chicken soup or other clear broths, and maintaining good nutrition.  Getting plenty of rest.  Using gargles or lozenges for comfort.  Controlling fevers with ibuprofen or acetaminophen as directed by your caregiver.  Increasing usage of your inhaler if you have asthma. Zinc gel and zinc lozenges, taken in the first 24 hours of the common cold, can shorten the duration and lessen the severity of symptoms. Pain medicines may help with fever, muscle aches, and throat pain. A variety of non-prescription medicines are available to treat congestion and runny nose. Your caregiver can make recommendations and may suggest nasal or lung inhalers for other symptoms.  HOME CARE INSTRUCTIONS   Only take over-the-counter or  prescription medicines for pain, discomfort, or fever as directed by your caregiver.  Use a warm mist humidifier or inhale steam from a shower to increase air moisture. This may keep secretions moist and make it easier to breathe.  Drink enough water and fluids to keep your urine clear or pale yellow.  Rest as needed.  Return to work when your temperature has returned to normal or as your caregiver advises. You may need to stay home  longer to avoid infecting others. You can also use a face mask and careful hand washing to prevent spread of the virus. SEEK MEDICAL CARE IF:   After the first few days, you feel you are getting worse rather than better.  You need your caregiver's advice about medicines to control symptoms.  You develop chills, worsening shortness of breath, or brown or red sputum. These may be signs of pneumonia.  You develop yellow or brown nasal discharge or pain in the face, especially when you bend forward. These may be signs of sinusitis.  You develop a fever, swollen neck glands, pain with swallowing, or white areas in the back of your throat. These may be signs of strep throat. SEEK IMMEDIATE MEDICAL CARE IF:   You have a fever.  You develop severe or persistent headache, ear pain, sinus pain, or chest pain.  You develop wheezing, a prolonged cough, cough up blood, or have a change in your usual mucus (if you have chronic lung disease).  You develop sore muscles or a stiff neck. Document Released: 04/09/2001 Document Revised: 01/06/2012 Document Reviewed: 01/19/2014 [facility] Patient Information 2015 [place], [place]. This information is not intended to replace advice given to you by your health care provider. Make sure you discuss any questions you have with your health care provider.

## 2015-04-18 NOTE — ED Provider Notes (Signed)
CSN: [id number]     Arrival date & time 04/18/15  1626 History   First MD Initiated Contact with Patient 04/18/15 1751     No chief complaint on file.  (Consider location/radiation/quality/duration/timing/severity/associated sxs/prior Treatment) HPI Comments: 54 year old female complaining of vertigo with room spinning. She states it occurred about 2 weeks ago cut better and then return again yesterday. It is worse with body position changes. She is also complaining of a sore throat, runny nose associated with occasional cough. Denies documented fever.   Past Medical History  Diagnosis Date  . Hiatal hernia   . GERD (gastroesophageal reflux disease)   . Seasonal allergies   . Arthritis     right hand  . Mass of finger of right hand 02/2013    right middle finger  . Sinus congestion 03/16/2013    cough, stuffy and runny nose  . Wears partial dentures     upper  . Urine incontinence   . Chicken pox     Childhood  . Diverticulosis of colon (without mention of hemorrhage)   . Hyperplastic colon polyp    Past Surgical History  Procedure Laterality Date  . Foot surgery Bilateral     exc. ingrown toenail great toe  . Tubal ligation  09/02/2002  . Colonoscopy  08/14/2007    562.10, 211.3   Family History  Problem Relation Age of Onset  . Kidney disease Father   . Heart disease Father   . Stroke Father   . Hypertension Father   . Heart disease Mother   . Depression Mother   . Colon cancer Neg Hx    History  Substance Use Topics  . Smoking status: Former Research scientist (life sciences)  . Smokeless tobacco: Never Used     Comment: quit smoking 20 years ago  . Alcohol Use: No   OB History    Gravida Para Term Preterm AB TAB SAB Ectopic Multiple Living   1 1 1       1      Review of Systems  Constitutional: Positive for chills and activity change. Negative for fever, appetite change and fatigue.  HENT: Positive for congestion, postnasal drip and rhinorrhea. Negative for facial swelling and sore  throat.   Eyes: Negative.   Respiratory: Positive for cough.   Cardiovascular: Negative.   Gastrointestinal: Negative.   Musculoskeletal: Negative for neck pain and neck stiffness.  Skin: Negative for pallor and rash.  Neurological: Negative.     Allergies  Shellfish allergy  Home Medications   Prior to Admission medications   Medication Sig Start Date End Date Taking? Authorizing Provider  albuterol (PROVENTIL HFA;VENTOLIN HFA) 108 (90 BASE) MCG/ACT inhaler Inhale 2 puffs into the lungs every 4 (four) hours as needed for wheezing or shortness of breath. 04/18/15   Janne Napoleon, NP  ipratropium (ATROVENT) 0.06 % nasal spray Place 2 sprays into both nostrils 4 (four) times daily. Prn runny nose and drainage 04/18/15   Janne Napoleon, NP  meclizine (ANTIVERT) 25 MG tablet 1/2 to 1 tab q 8 h prn vertigo 04/18/15   Janne Napoleon, NP  predniSONE (DELTASONE) 20 MG tablet Take 3 tabs po on first day, 2 tabs second day, 2 tabs third day, 1 tab fourth day, 1 tab 5th day. Take with food. 04/18/15   Janne Napoleon, NP   BP 117/77 mmHg  Pulse 95  Temp(Src) 99.1 F (37.3 C) (Oral)  Resp 20  SpO2 96% Physical Exam  Constitutional: She is oriented to person, place, and time.  She appears well-developed and well-nourished. No distress.  HENT:  Mouth/Throat: No oropharyngeal exudate.  Bilat TM's nl OP with minor erythema and moderate clear PND  Eyes: Conjunctivae and EOM are normal.  Neck: Normal range of motion.  Cardiovascular: Normal rate, regular rhythm and normal heart sounds.   Pulmonary/Chest: Effort normal. No respiratory distress. She has no wheezes. She has no rales.  End expiratory coarseness bilaterally. No true wheeze. Mildly prolonged expiratory phase.  Musculoskeletal: She exhibits no edema.  Lymphadenopathy:    She has no cervical adenopathy.  Neurological: She is alert and oriented to person, place, and time.  Skin: Skin is warm and dry.  Psychiatric: She has a normal mood and affect.   Nursing note and vitals reviewed.   ED Course  Procedures (including critical care time) Labs Review Labs Reviewed - No data to display  Imaging Review No results found.   MDM   1. URI (upper respiratory infection)   2. Bronchospasm   3. BPV (benign positional vertigo), unspecified laterality    Albuterol HFA 2 puffs every 4 hours as needed for cough and wheeze Prednisone 20 mg taper dose Meclizine for dizziness and vertigo Atrovent nasal spray for runny nose Continue the Zyrtec May a Chlor-Trimeton 2 mg every 4 hours as needed for excessive drainage. Drink plenty of fluids stay well-hydrated Follow-up here PCP later this week if needed.      Janne Napoleon, NP 04/18/15 [phone]

## 2015-04-18 NOTE — ED Notes (Signed)
Pt states that when she gets up she gets really dizzy and has a cough along with a sore throat

## 2015-05-19 ENCOUNTER — Other Ambulatory Visit: Payer: Self-pay

## 2015-05-19 DIAGNOSIS — Z1231 Encounter for screening mammogram for malignant neoplasm of breast: Secondary | ICD-10-CM

## 2015-06-01 ENCOUNTER — Ambulatory Visit
Admission: RE | Admit: 2015-06-01 | Discharge: 2015-06-01 | Disposition: A | Discharge status: Home / Self Care | Payer: BLUE CROSS/BLUE SHIELD | Source: Ambulatory Visit

## 2015-06-01 DIAGNOSIS — Z1231 Encounter for screening mammogram for malignant neoplasm of breast: Secondary | ICD-10-CM

## 2015-08-14 ENCOUNTER — Other Ambulatory Visit: Payer: BLUE CROSS/BLUE SHIELD

## 2015-08-14 DIAGNOSIS — E7800 Pure hypercholesterolemia, unspecified: Secondary | ICD-10-CM

## 2015-08-14 LAB — LIPID PANEL
CHOL/HDL RATIO: 4.1 ratio (ref <=5.0)
Cholesterol: 217 mg/dL — ABNORMAL HIGH (ref 125–200)
HDL: 53 mg/dL (ref >=46)
LDL CALC: 141 mg/dL — AB (ref <130)
Triglycerides: 113 mg/dL (ref <150)
VLDL: 23 mg/dL (ref <30)

## 2015-08-16 ENCOUNTER — Ambulatory Visit: Payer: BLUE CROSS/BLUE SHIELD | Admitting: Gynecology

## 2015-08-17 ENCOUNTER — Encounter: Payer: Self-pay | Admitting: Gynecology

## 2015-08-17 ENCOUNTER — Ambulatory Visit (INDEPENDENT_AMBULATORY_CARE_PROVIDER_SITE_OTHER): Payer: BLUE CROSS/BLUE SHIELD | Admitting: Gynecology

## 2015-08-17 VITALS — BP 126/84

## 2015-08-17 DIAGNOSIS — R232 Flushing: Secondary | ICD-10-CM

## 2015-08-17 DIAGNOSIS — N898 Other specified noninflammatory disorders of vagina: Secondary | ICD-10-CM

## 2015-08-17 DIAGNOSIS — L292 Pruritus vulvae: Secondary | ICD-10-CM

## 2015-08-17 DIAGNOSIS — G4700 Insomnia, unspecified: Secondary | ICD-10-CM

## 2015-08-17 DIAGNOSIS — K921 Melena: Secondary | ICD-10-CM | POA: Diagnosis not present

## 2015-08-17 DIAGNOSIS — R159 Full incontinence of feces: Secondary | ICD-10-CM

## 2015-08-17 MED ORDER — PAROXETINE MESYLATE 7.5 MG PO CAPS: 7.5000 mg | ORAL_CAPSULE | Freq: Every morning | ORAL | Status: DC

## 2015-08-17 MED ORDER — ZOLPIDEM TARTRATE 10 MG PO TABS: 10.0000 mg | ORAL_TABLET | Freq: Every evening | ORAL | Status: DC | PRN

## 2015-08-17 MED ORDER — ESTRADIOL 0.1 MG/GM VA CREA: TOPICAL_CREAM | VAGINAL | Status: DC

## 2015-08-17 NOTE — Progress Notes (Signed)
   Patient is a 54 year old who presented to the office today with the following complaints: #1 patient with insomnia getting up several times at night #2 patient complaining of hot flashes throughout the day #3 patient complaining of vaginal dryness and irritation and occasional pruritus. Denies discharge #5 seepage of stool occasionally  Patient reached the menopause at the age of 56 has never been on hormone replacement therapy. Patient reports normal colonoscopy in 2015 does have history diverticulosis.  Exam: Overweight female at 200 pounds BMI 38.40 Abdomen: Soft nontender no rebound or guarding Pelvic: Bartholin urethra Skene was within normal limits Vagina: No lesions or discharge atrophic changes noted cervix: No lesions or discharge Uterus: Anteverted normal size shape and consistency Adnexa: No palpable masses or tenderness Rectal exam good sphincter tone good anal wink  Assessment/plan: #1 Patient 11 years out from her last menstrual period. Has never been on hormone replacement therapy to minimize her risk of breast cancer starting hormone 11 years out have recommended that she be placed on Brisdelle 7.5 mg daily which is a low dose paroxetine to help with her vasomotor symptoms and also continues peppermint oil transdermally when necessary. #2 for insomnia all the above medication cakes and she'll be prescribed Ambien 10 mg to take 1 by mouth at bedtime when necessary #3 for her vaginal atrophy she'll be placed on Estrace vaginal cream to apply half an applicator intravaginally in to apply some externally twice a week for vulvar pruritus and dryness #4 instructions and literature information provided on Keagle exercises if she continues with the symptoms of loose stool she'll contact the office and we'll make an upon of her to see a colorectal surgeon #5 isolated event of hematochezia she was provided with Hemoccult cards to submit to the office for testing.

## 2015-08-17 NOTE — Patient Instructions (Addendum)
Paroxetine capsules What is this medicine? PAROXETINE (pa ROX e teen) is used to treat hot flashes due to menopause. This medicine may be used for other purposes; ask your health care provider or pharmacist if you have questions. What should I tell my health care provider before I take this medicine? They need to know if you have any of these conditions: -bleeding disorders -glaucoma -heart disease -kidney disease -liver disease -low levels of sodium in the blood -mania or bipolar disorder -seizures -suicidal thoughts, plans, or attempt; a previous suicide attempt by you or a family member -take MAOIs like Carbex, Eldepryl, Marplan, Nardil, and Parnate -take medicines that treat or prevent blood clots -an unusual or allergic reaction to paroxetine, other medicines, foods, dyes, or preservatives -pregnant or trying to get pregnant -breast-feeding How should I use this medicine? Take this medicine by mouth once daily at bedtime. Follow the directions on the prescription label. This medicine can be taken with or without food. Take your medicine at regular intervals. Do not take your medicine more often than directed. A special MedGuide will be given to you by the pharmacist with each prescription and refill. Be sure to read this information carefully each time. Overdosage: If you think you have taken too much of this medicine contact a poison control center or emergency room at once. NOTE: This medicine is only for you. Do not share this medicine with others. What if I miss a dose? If you miss a dose, take it as soon as you can. If it is almost time for your next dose, take only that dose. Do not take double or extra doses. What may interact with this medicine? Do not take this medicine with any of the following medications: -linezolid -MAOIs like Carbex, Eldepryl, Marplan, Nardil, and Parnate -methylene blue (injected into a vein) -pimozide -thioridazine This medicine may also  interact with the following medications: -alcohol -aspirin and aspirin-like medicines -atomoxetine -certain medicines for depression, anxiety, or psychotic disturbances -certain medicines for irregular heart beat like propafenone, flecainide, encainide, and quinidine -certain medicines for migraine headache like almotriptan, eletriptan, frovatriptan, naratriptan, rizatriptan, sumatriptan, zolmitriptan -cimetidine -digoxin -diuretics -fentanyl -fosamprenavir -furazolidone -isoniazid -lithium -medicines that treat or prevent blood clots like warfarin, enoxaparin, and dalteparin -medicines for sleep -NSAIDs, medicines for pain and inflammation, like ibuprofen or naproxen -phenobarbital -phenytoin -procarbazine -rasagiline -ritonavir -supplements like St. John's wort, kava kava, valerian -tamoxifen -tramadol -tryptophan This list may not describe all possible interactions. Give your health care provider a list of all the medicines, herbs, non-prescription drugs, or dietary supplements you use. Also tell them if you smoke, drink alcohol, or use illegal drugs. Some items may interact with your medicine. What should I watch for while using this medicine? Tell your doctor or healthcare professional if your symptoms do not start to get better or if they get worse. Visit your doctor or health care professional for regular checks on your progress. Patients and their families should watch out for new or worsening thoughts of suicide or depression. Also watch out for sudden changes in feelings such as feeling anxious, agitated, panicky, irritable, hostile, aggressive, impulsive, severely restless, overly excited and hyperactive, or not being able to sleep. If this happens, especially at the beginning of treatment or after a change in dose, call your health care professional. Dennis Bast may get drowsy or dizzy. Do not drive, use machinery, or do anything that needs mental alertness until you know how this  medicine affects you. Do not stand or sit up  quickly, especially if you are an older patient. This reduces the risk of dizzy or fainting spells. Alcohol may interfere with the effect of this medicine. Avoid alcoholic drinks. Your mouth may get dry. Chewing sugarless gum, sucking hard candy and drinking plenty of water will help. Contact your doctor if the problem does not go away or is severe. Women should inform their doctor if they wish to become pregnant or think they might be pregnant. There is a potential for serious side effects to an unborn child. Talk to your health care professional or pharmacist for more information. Do not become pregnant while taking this medicine. What side effects may I notice from receiving this medicine? Side effects that you should report to your doctor or health care professional as soon as possible: -allergic reactions like skin rash, itching or hives, swelling of the face, lips, or tongue -changes in emotions or moods -confusion -depression -feeling faint or lightheaded, falls -seizures -suicidal thoughts or actions -unusual bleeding or bruising -unusually weak or tired -weakness Side effects that usually do not require medical attention (Report these to your doctor or health care professional if they continue or are bothersome.): -change in sex drive or performance -fatigue -drowsiness -headache -insomnia -nausea/vomiting -upset stomach This list may not describe all possible side effects. Call your doctor for medical advice about side effects. You may report side effects to FDA at 1-800-FDA-1088. Where should I keep my medicine? Keep out of the reach of children. Store at room temperature between 20 and 25 degrees C (68 and 77 degrees F). Throw away any unused medicine after the expiration date. NOTE: This sheet is a summary. It may not cover all possible information. If you have questions about this medicine, talk to your doctor, pharmacist, or  health care provider.    2016, Elsevier/Gold Standard. (2012-06-15 12:26:17) Kegel Exercises The goal of Kegel exercises is to isolate and exercise your pelvic floor muscles. These muscles act as a hammock that supports the rectum, vagina, small intestine, and uterus. As the muscles weaken, the hammock sags and these organs are displaced from their normal positions. Kegel exercises can strengthen your pelvic floor muscles and help you to improve bladder and bowel control, improve sexual response, and help reduce many problems and some discomfort during pregnancy. Kegel exercises can be done anywhere and at any time. HOW TO PERFORM KEGEL EXERCISES 1. Locate your pelvic floor muscles. To do this, squeeze (contract) the muscles that you use when you try to stop the flow of urine. You will feel a tightness in the vaginal area (women) and a tight lift in the rectal area (men and women). 2. When you begin, contract your pelvic muscles tight for 2-5 seconds, then relax them for 2-5 seconds. This is one set. Do 4-5 sets with a short pause in between. 3. Contract your pelvic muscles for 8-10 seconds, then relax them for 8-10 seconds. Do 4-5 sets. If you cannot contract your pelvic muscles for 8-10 seconds, try 5-7 seconds and work your way up to 8-10 seconds. Your goal is 4-5 sets of 10 contractions each day. Keep your stomach, buttocks, and legs relaxed during the exercises. Perform sets of both short and long contractions. Vary your positions. Perform these contractions 3-4 times per day. Perform sets while you are:   Lying in bed in the morning.  Standing at lunch.  Sitting in the late afternoon.  Lying in bed at night. You should do 40-50 contractions per day. Do not perform more  Kegel exercises per day than recommended. Overexercising can cause muscle fatigue. Continue these exercises for for at least 15-20 weeks or as directed by your caregiver.   This information is not intended to replace  advice given to you by your health care provider. Make sure you discuss any questions you have with your health care provider.   Document Released: 09/30/2012 Document Revised: 11/04/2014 Document Reviewed: 09/30/2012 Elsevier Interactive Patient Education 2016 [employer].  Estradiol vaginal cream What is this medicine? ESTRADIOL (es tra DYE ole) contains the female hormone estrogen. It is used for symptoms of menopause, like vaginal dryness and irritation. This medicine may be used for other purposes; ask your health care provider or pharmacist if you have questions. What should I tell my health care provider before I take this medicine? They need to know if you have any of these conditions: -abnormal vaginal bleeding -blood vessel disease or blood clots -breast, cervical, endometrial, ovarian, liver, or uterine cancer -dementia -diabetes -gallbladder disease -heart disease or recent heart attack -high blood pressure -high cholesterol -high levels of calcium in the blood -hysterectomy -kidney disease -liver disease -migraine headaches -protein C deficiency -protein S deficiency -stroke -systemic lupus erythematosus (SLE) -tobacco smoker -an unusual or allergic reaction to estrogens, other hormones, soy, other medicines, foods, dyes, or preservatives -pregnant or trying to get pregnant -breast-feeding How should I use this medicine? This medicine is for use in the vagina only. Do not take by mouth. Follow the directions on the prescription label. Read package directions carefully before using. Use the special applicator supplied with the cream. Wash hands before and after use. Fill the applicator with the prescribed amount of cream. Lie on your back, part and bend your knees. Insert the applicator into the vagina and push the plunger to expel the cream into the vagina. Wash the applicator with warm soapy water and rinse well. Use exactly as directed for the complete length of  time prescribed. Do not stop using except on the advice of your doctor or health care professional. A patient package insert for the product will be given with each prescription and refill. Read this sheet carefully each time. The sheet may change frequently. Talk to your pediatrician regarding the use of this medicine in children. This medicine is not approved for use in children. Overdosage: If you think you have taken too much of this medicine contact a poison control center or emergency room at once. NOTE: This medicine is only for you. Do not share this medicine with others. What if I miss a dose? If you miss a dose, use it as soon as you can. If it is almost time for your next dose, use only that dose. Do not use double or extra doses. What may interact with this medicine? Do not take this medicine with any of the following medications: -aromatase inhibitors like aminoglutethimide, anastrozole, exemestane, letrozole, testolactone This medicine may also interact with the following medications: -barbiturates used for inducing sleep or treating seizures -carbamazepine -grapefruit juice -medicines for fungal infections like ketoconazole and itraconazole -raloxifene -rifabutin -rifampin -rifapentine -ritonavir -some antibiotics used to treat infections -St. John's Wort -tamoxifen -warfarin This list may not describe all possible interactions. Give your health care provider a list of all the medicines, herbs, non-prescription drugs, or dietary supplements you use. Also tell them if you smoke, drink alcohol, or use illegal drugs. Some items may interact with your medicine. What should I watch for while using this medicine? Visit your health care professional  for regular checks on your progress. You will need a regular breast and pelvic exam. You should also discuss the need for regular mammograms with your health care professional, and follow his or her guidelines. This medicine can make  your body retain fluid, making your fingers, hands, or ankles swell. Your blood pressure can go up. Contact your doctor or health care professional if you feel you are retaining fluid. If you have any reason to think you are pregnant, stop taking this medicine at once and contact your doctor or health care professional. Tobacco smoking increases the risk of getting a blood clot or having a stroke, especially if you are more than 56 years old. You are strongly advised not to smoke. If you wear contact lenses and notice visual changes, or if the lenses begin to feel uncomfortable, consult your eye care specialist. If you are going to have elective surgery, you may need to stop taking this medicine beforehand. Consult your health care professional for advice prior to scheduling the surgery. What side effects may I notice from receiving this medicine? Side effects that you should report to your doctor or health care professional as soon as possible: -allergic reactions like skin rash, itching or hives, swelling of the face, lips, or tongue -breast tissue changes or discharge -changes in vision -chest pain -confusion, trouble speaking or understanding -dark urine -general ill feeling or flu-like symptoms -light-colored stools -nausea, vomiting -pain, swelling, warmth in the leg -right upper belly pain -severe headaches -shortness of breath -sudden numbness or weakness of the face, arm or leg -trouble walking, dizziness, loss of balance or coordination -unusual vaginal bleeding -yellowing of the eyes or skin Side effects that usually do not require medical attention (report to your doctor or health care professional if they continue or are bothersome): -hair loss -increased hunger or thirst -increased urination -symptoms of vaginal infection like itching, irritation or unusual discharge -unusually weak or tired This list may not describe all possible side effects. Call your doctor for medical  advice about side effects. You may report side effects to FDA at 1-800-FDA-1088. Where should I keep my medicine? Keep out of the reach of children. Store at room temperature between 15 and 30 degrees C (59 and 86 degrees F). Protect from temperatures above 40 degrees C (104 degrees C). Do not freeze. Throw away any unused medicine after the expiration date. NOTE: This sheet is a summary. It may not cover all possible information. If you have questions about this medicine, talk to your doctor, pharmacist, or health care provider.    2016, Elsevier/Gold Standard. (2011-01-16 09:18:12)

## 2015-09-19 ENCOUNTER — Encounter (HOSPITAL_COMMUNITY): Payer: Self-pay | Admitting: Emergency Medicine

## 2015-09-19 ENCOUNTER — Emergency Department (INDEPENDENT_AMBULATORY_CARE_PROVIDER_SITE_OTHER)
Admission: EM | Admit: 2015-09-19 | Discharge: 2015-09-19 | Disposition: A | Discharge status: Home / Self Care | Payer: BLUE CROSS/BLUE SHIELD | Source: Home / Self Care

## 2015-09-19 DIAGNOSIS — J0100 Acute maxillary sinusitis, unspecified: Secondary | ICD-10-CM | POA: Diagnosis not present

## 2015-09-19 MED ORDER — IPRATROPIUM BROMIDE 0.06 % NA SOLN: 2.0000 | Freq: Four times a day (QID) | NASAL | Status: DC

## 2015-09-19 MED ORDER — BENZONATATE 100 MG PO CAPS: 100.0000 mg | ORAL_CAPSULE | Freq: Three times a day (TID) | ORAL | Status: DC

## 2015-09-19 MED ORDER — ALBUTEROL SULFATE HFA 108 (90 BASE) MCG/ACT IN AERS: 2.0000 | INHALATION_SPRAY | RESPIRATORY_TRACT | Status: DC | PRN

## 2015-09-19 NOTE — ED Notes (Signed)
Pt here with c/o URI sx's that started last week. Chest cold, coughing white phlegm, chills and body aches Denies treatment at home

## 2015-09-19 NOTE — Discharge Instructions (Signed)

## 2015-10-06 ENCOUNTER — Encounter: Payer: Self-pay | Admitting: Family

## 2015-10-06 ENCOUNTER — Ambulatory Visit (INDEPENDENT_AMBULATORY_CARE_PROVIDER_SITE_OTHER): Payer: BLUE CROSS/BLUE SHIELD | Admitting: Family

## 2015-10-06 VITALS — BP 112/78 | HR 81 | Temp 97.7°F | Resp 18 | Ht 60.5 in | Wt 209.0 lb

## 2015-10-06 DIAGNOSIS — J019 Acute sinusitis, unspecified: Secondary | ICD-10-CM | POA: Insufficient documentation

## 2015-10-06 DIAGNOSIS — J0100 Acute maxillary sinusitis, unspecified: Secondary | ICD-10-CM | POA: Diagnosis not present

## 2015-10-06 MED ORDER — AMOXICILLIN-POT CLAVULANATE 875-125 MG PO TABS: 1.0000 | ORAL_TABLET | Freq: Two times a day (BID) | ORAL | Status: DC

## 2015-10-06 MED ORDER — LORATADINE 10 MG PO TABS: 10.0000 mg | ORAL_TABLET | Freq: Every day | ORAL | Status: DC

## 2015-10-06 MED ORDER — HYDROCODONE-HOMATROPINE 5-1.5 MG/5ML PO SYRP: 5.0000 mL | ORAL_SOLUTION | Freq: Every evening | ORAL | Status: DC | PRN

## 2015-10-06 NOTE — Patient Instructions (Signed)
Thank you for choosing [facility].  Summary/Instructions:  Your prescription(s) have been submitted to your pharmacy or been printed and provided for you. Please take as directed and contact our office if you believe you are having problem(s) with the medication(s) or have any questions.  If your symptoms worsen or fail to improve, please contact our office for further instruction, or in case of emergency go directly to the emergency room at the closest medical facility.   General Recommendations:    Please drink plenty of fluids.  Get plenty of rest   Sleep in humidified air  Use saline nasal sprays  Netti pot   OTC Medications:  Decongestants - helps relieve congestion   Flonase (generic fluticasone) or Nasacort (generic triamcinolone) - please make sure to use the "cross-over" technique at a 45 degree angle towards the opposite eye as opposed to straight up the nasal passageway.   Sudafed (generic pseudoephedrine - Note this is the one that is available behind the pharmacy counter); Products with phenylephrine (-PE) may also be used but is often not as effective as pseudoephedrine.   If you have HIGH BLOOD PRESSURE - Coricidin HBP; AVOID any product that is -D as this contains pseudoephedrine which may increase your blood pressure.  Afrin (oxymetazoline) every 6-8 hours for up to 3 days.   Allergies - helps relieve runny nose, itchy eyes and sneezing   Claritin (generic loratidine), Allegra (fexofenidine), or Zyrtec (generic cyrterizine) for runny nose. These medications should not cause drowsiness.  Note - Benadryl (generic diphenhydramine) may be used however may cause drowsiness  Cough -   Delsym or Robitussin (generic dextromethorphan)  Expectorants - helps loosen mucus to ease removal   Mucinex (generic guaifenesin) as directed on the package.  Headaches / General Aches   Tylenol (generic acetaminophen) - DO NOT EXCEED 3 grams (3,000 mg) in a 24  hour time period  Advil/Motrin (generic ibuprofen)   Sore Throat -   Salt water gargle   Chloraseptic (generic benzocaine) spray or lozenges / Sucrets (generic dyclonine)    Sinusitis Sinusitis is redness, soreness, and inflammation of the paranasal sinuses. Paranasal sinuses are air pockets within the bones of your face (beneath the eyes, the middle of the forehead, or above the eyes). In healthy paranasal sinuses, mucus is able to drain out, and air is able to circulate through them by way of your nose. However, when your paranasal sinuses are inflamed, mucus and air can become trapped. This can allow bacteria and other germs to grow and cause infection. Sinusitis can develop quickly and last only a short time (acute) or continue over a long period (chronic). Sinusitis that lasts for more than 12 weeks is considered chronic.  CAUSES  Causes of sinusitis include:  Allergies.  Structural abnormalities, such as displacement of the cartilage that separates your nostrils (deviated septum), which can decrease the air flow through your nose and sinuses and affect sinus drainage.  Functional abnormalities, such as when the small hairs (cilia) that line your sinuses and help remove mucus do not work properly or are not present. SIGNS AND SYMPTOMS  Symptoms of acute and chronic sinusitis are the same. The primary symptoms are pain and pressure around the affected sinuses. Other symptoms include:  Upper toothache.  Earache.  Headache.  Bad breath.  Decreased sense of smell and taste.  A cough, which worsens when you are lying flat.  Fatigue.  Fever.  Thick drainage from your nose, which often is green and may   contain pus (purulent).  Swelling and warmth over the affected sinuses. DIAGNOSIS  Your health care provider will perform a physical exam. During the exam, your health care provider may:  Look in your nose for signs of abnormal growths in your nostrils (nasal  polyps).  Tap over the affected sinus to check for signs of infection.  View the inside of your sinuses (endoscopy) using an imaging device that has a light attached (endoscope). If your health care provider suspects that you have chronic sinusitis, one or more of the following tests may be recommended:  Allergy tests.  Nasal culture. A sample of mucus is taken from your nose, sent to a lab, and screened for bacteria.  Nasal cytology. A sample of mucus is taken from your nose and examined by your health care provider to determine if your sinusitis is related to an allergy. TREATMENT  Most cases of acute sinusitis are related to a viral infection and will resolve on their own within 10 days. Sometimes medicines are prescribed to help relieve symptoms (pain medicine, decongestants, nasal steroid sprays, or saline sprays).  However, for sinusitis related to a bacterial infection, your health care provider will prescribe antibiotic medicines. These are medicines that will help kill the bacteria causing the infection.  Rarely, sinusitis is caused by a fungal infection. In theses cases, your health care provider will prescribe antifungal medicine. For some cases of chronic sinusitis, surgery is needed. Generally, these are cases in which sinusitis recurs more than 3 times per year, despite other treatments. HOME CARE INSTRUCTIONS   Drink plenty of water. Water helps thin the mucus so your sinuses can drain more easily.  Use a humidifier.  Inhale steam 3 to 4 times a day (for example, sit in the bathroom with the shower running).  Apply a warm, moist washcloth to your face 3 to 4 times a day, or as directed by your health care provider.  Use saline nasal sprays to help moisten and clean your sinuses.  Take medicines only as directed by your health care provider.  If you were prescribed either an antibiotic or antifungal medicine, finish it all even if you start to feel better. SEEK IMMEDIATE  MEDICAL CARE IF:  You have increasing pain or severe headaches.  You have nausea, vomiting, or drowsiness.  You have swelling around your face.  You have vision problems.  You have a stiff neck.  You have difficulty breathing. MAKE SURE YOU:   Understand these instructions.  Will watch your condition.  Will get help right away if you are not doing well or get worse. Document Released: 10/14/2005 Document Revised: 02/28/2014 Document Reviewed: 10/29/2011 [employer] Patient Information 2015 [employer]. This information is not intended to replace advice given to you by your health care provider. Make sure you discuss any questions you have with your health care provider.   

## 2015-10-06 NOTE — Assessment & Plan Note (Signed)
Symptoms and exam consistent with acute sinusitis and possibly underlying allergic rhinitis. Start Claritin. Start Augmentin. Start Hycodan as needed for cough and sleep. Continue over-the-counter medications as needed for symptom relief and supportive care. Follow-up if symptoms worsen or fail to improve.

## 2015-10-06 NOTE — Progress Notes (Signed)
Subjective:    Patient ID: Marie Torres, female    DOB: 07-28-1961, 54 y.o.   MRN: [id number]  Chief Complaint  Patient presents with  . Nasal Congestion    x2 weeks, cough, congestion, sinus pressure    HPI:  Marie Torres is a 54 y.o. female who  has a past medical history of Hiatal hernia; GERD (gastroesophageal reflux disease); Seasonal allergies; Arthritis; Mass of finger of right hand (02/2013); Sinus congestion (03/16/2013); Wears partial dentures; Urine incontinence; Chicken pox; Diverticulosis of colon (without mention of hemorrhage); and Hyperplastic colon polyp. and presents today for an acute office visit.   Associated symptoms of cough, congestion and sinus pressure have been going on for about 2 weeks. Has been seen in Urgent Care and was prescribed an inhaler and cough medicine which did not help very much. She has also tried OTC sudafed. Denies fevers. Severity of the cough is enough to disturb her sleep.    Allergies  Allergen Reactions  . Shellfish Allergy Hives     Current Outpatient Prescriptions on File Prior to Visit  Medication Sig Dispense Refill  . albuterol (PROVENTIL HFA;VENTOLIN HFA) 108 (90 BASE) MCG/ACT inhaler Inhale 2 puffs into the lungs every 4 (four) hours as needed for wheezing or shortness of breath. 1 Inhaler 0  . benzonatate (TESSALON) 100 MG capsule Take 1 capsule (100 mg total) by mouth every 8 (eight) hours. 21 capsule 0  . estradiol (ESTRACE) 0.1 MG/GM vaginal cream 1/2 applicator full two times a week 42.5 g 8  . ipratropium (ATROVENT) 0.06 % nasal spray Place 2 sprays into both nostrils 4 (four) times daily. 15 mL 12  . meclizine (ANTIVERT) 25 MG tablet 1/2 to 1 tab q 8 h prn vertigo 20 tablet 0  . PARoxetine Mesylate (BRISDELLE) 7.5 MG CAPS Take 7.5 mg by mouth every morning. 30 capsule 11  . predniSONE (DELTASONE) 20 MG tablet Take 3 tabs po on first day, 2 tabs second day, 2 tabs third day, 1 tab fourth day, 1 tab 5th day. Take with food.  9 tablet 0  . zolpidem (AMBIEN) 10 MG tablet Take 1 tablet (10 mg total) by mouth at bedtime as needed for sleep. 30 tablet 3   No current facility-administered medications on file prior to visit.    Review of Systems  Constitutional: Negative for fever.  HENT: Positive for congestion and sinus pressure. Negative for sore throat.   Respiratory: Positive for cough. Negative for chest tightness and shortness of breath.   Neurological: Positive for headaches.      Objective:    BP 112/78 mmHg  Pulse 81  Temp(Src) 97.7 F (36.5 C) (Oral)  Resp 18  Ht 5' 0.5" (1.537 m)  Wt 209 lb (94.802 kg)  BMI 40.13 kg/m2  SpO2 98% Nursing note and vital signs reviewed.  Physical Exam  Constitutional: She is oriented to person, place, and time. She appears well-developed and well-nourished. No distress.  HENT:  Right Ear: Hearing, tympanic membrane, external ear and ear canal normal.  Left Ear: Hearing, tympanic membrane, external ear and ear canal normal.  Nose: Right sinus exhibits maxillary sinus tenderness and frontal sinus tenderness. Left sinus exhibits maxillary sinus tenderness and frontal sinus tenderness.  Mouth/Throat: Uvula is midline, oropharynx is clear and moist and mucous membranes are normal.  Neck: Neck supple.  Cardiovascular: Normal rate, regular rhythm, normal heart sounds and intact distal pulses.   Pulmonary/Chest: Effort normal and breath sounds normal.  Neurological: She is alert  and oriented to person, place, and time.  Skin: Skin is warm and dry.  Psychiatric: She has a normal mood and affect. Her behavior is normal. Judgment and thought content normal.       Assessment & Plan:   Problem List Items Addressed This Visit      Respiratory   Sinusitis, acute - Primary    Symptoms and exam consistent with acute sinusitis and possibly underlying allergic rhinitis. Start Claritin. Start Augmentin. Start Hycodan as needed for cough and sleep. Continue over-the-counter  medications as needed for symptom relief and supportive care. Follow-up if symptoms worsen or fail to improve.      Relevant Medications   loratadine (CLARITIN) 10 MG tablet   amoxicillin-clavulanate (AUGMENTIN) 875-125 MG tablet   HYDROcodone-homatropine (HYCODAN) 5-1.5 MG/5ML syrup

## 2015-10-06 NOTE — Progress Notes (Signed)
Pre visit review using our clinic review tool, if applicable. No additional management support is needed unless otherwise documented below in the visit note. 

## 2015-11-01 ENCOUNTER — Other Ambulatory Visit: Payer: Self-pay | Admitting: Family

## 2016-02-23 ENCOUNTER — Ambulatory Visit (INDEPENDENT_AMBULATORY_CARE_PROVIDER_SITE_OTHER): Payer: BLUE CROSS/BLUE SHIELD | Admitting: Gynecology

## 2016-02-23 ENCOUNTER — Encounter: Payer: Self-pay | Admitting: Gynecology

## 2016-02-23 VITALS — BP 128/78 | Ht 61.75 in | Wt 206.0 lb

## 2016-02-23 DIAGNOSIS — Z780 Asymptomatic menopausal state: Secondary | ICD-10-CM | POA: Diagnosis not present

## 2016-02-23 DIAGNOSIS — Z01419 Encounter for gynecological examination (general) (routine) without abnormal findings: Secondary | ICD-10-CM

## 2016-02-23 DIAGNOSIS — R635 Abnormal weight gain: Secondary | ICD-10-CM | POA: Diagnosis not present

## 2016-02-23 MED ORDER — NALTREXONE-BUPROPION HCL ER 8-90 MG PO TB12: ORAL_TABLET | ORAL | Status: DC

## 2016-02-23 NOTE — Patient Instructions (Signed)
Bupropion; Naltrexone extended-release tablets What is this medicine? BUPROPION; NALTREXONE (byoo PROE pee on; nal TREX one) is a combination product used to promote and maintain weight loss in obese adults or overweight adults who also have weight related medical problems. This medicine should be used with a reduced calorie diet and increased physical activity. This medicine may be used for other purposes; ask your health care provider or pharmacist if you have questions. What should I tell my health care provider before I take this medicine? They need to know if you have any of these conditions: -an eating disorder, such as anorexia or bulimia -diabetes -glaucoma -head injury -heart disease -high blood pressure -history of a drug or alcohol abuse problem -history of a tumor or infection of your brain or spine -history of stroke -history of irregular heartbeat -kidney disease -liver disease -mental illness such as bipolar disorder or psychosis -seizures -suicidal thoughts, plans, or attempt; a previous suicide attempt by you or a family member -an unusual or allergic reaction to bupropion, naltrexone, other medicines, foods, dyes, or preservatives breast-feeding -pregnant or trying to become pregnant How should I use this medicine? Take this medicine by mouth with a glass of water. Follow the directions on the prescription label. Take this medicine in the morning and in the evenings as directed by your healthcare professional. You can take it with or without food. Do not take with high-fat meals as this may increase your risk of seizures. Do not crush, chew, or cut these tablets. Do not take your medicine more often than directed. Do not stop taking this medicine suddenly except upon the advice of your doctor. A special MedGuide will be given to you by the pharmacist with each prescription and refill. Be sure to read this information carefully each time. Talk to your pediatrician  regarding the use of this medicine in children. Special care may be needed. Overdosage: If you think you have taken too much of this medicine contact a poison control center or emergency room at once. NOTE: This medicine is only for you. Do not share this medicine with others. What if I miss a dose? If you miss a dose, skip the missed dose and take your next tablet at the regular time. Do not take double or extra doses. What may interact with this medicine? Do not take this medicine with any of the following medications: -any prescription or street opioid drug like codiene, heroin, methadone -linezolid -MAOIs like Carbex, Eldepryl, Marplan, Nardil, and Parnate -methylene blue (injected into a vein) -other medicines that contain bupropion like Zyban or Wellbutrin This medicine may also interact with the following medications: -alcohol -certain medicines for anxiety or sleep -certain medicines for blood pressure like metoprolol, propranolol -certain medicines for depression or psychotic disturbances -certain medicines for HIV or AIDS like efavirenz, lopinavir, nelfinavir, ritonavir -certain medicines for irregular heart beat like propafenone, flecainide -certain medicines for Parkinson's disease like amantadine, levodopa -certain medicines for seizures like carbamazepine, phenytoin, phenobarbital -cimetidine -clopidogrel -cyclophosphamide -disulfiram -furazolidone -isoniazid -nicotine -orphenadrine -procarbazine -steroid medicines like prednisone or cortisone -stimulant medicines for attention disorders, weight loss, or to stay awake -tamoxifen -theophylline -thioridazine -thiotepa -ticlopidine -tramadol -warfarin This list may not describe all possible interactions. Give your health care provider a list of all the medicines, herbs, non-prescription drugs, or dietary supplements you use. Also tell them if you smoke, drink alcohol, or use illegal drugs. Some items may interact  with your medicine. What should I watch for while using this medicine? This   medicine is intended to be used in addition to a healthy diet and appropriate exercise. The best results are achieved this way. Do not increase or in any way change your dose without consulting your doctor or health care professional. Do not take this medicine with other prescription or over-the-counter weight loss products without consulting your doctor or health care professional. Your doctor should tell you to stop taking this medicine if you do not lose a certain amount of weight within the first 12 weeks of treatment. Visit your doctor or health care professional for regular checkups. Your doctor may order blood tests or other tests to see how you are doing. This medicine may affect blood sugar levels. If you have diabetes, check with your doctor or health care professional before you change your diet or the dose of your diabetic medicine. Patients and their families should watch out for new or worsening depression or thoughts of suicide. Also watch out for sudden changes in feelings such as feeling anxious, agitated, panicky, irritable, hostile, aggressive, impulsive, severely restless, overly excited and hyperactive, or not being able to sleep. If this happens, especially at the beginning of treatment or after a change in dose, call your health care professional. Avoid alcoholic drinks while taking this medicine. Drinking large amounts of alcoholic beverages, using sleeping or anxiety medicines, or quickly stopping the use of these agents while taking this medicine may increase your risk for a seizure. What side effects may I notice from receiving this medicine? Side effects that you should report to your doctor or health care professional as soon as possible: -allergic reactions like skin rash, itching or hives, swelling of the face, lips, or tongue -breathing problems -changes in vision, hearing -chest  pain -confusion -dark urine -depressed mood -fast or irregular heart beat -fever -hallucination, loss of contact with reality -increased blood pressure -light-colored stools -redness, blistering, peeling or loosening of the skin, including inside the mouth -right upper belly pain -seizures -suicidal thoughts or other mood changes -unusually weak or tired -vomiting -yellowing of the eyes or skin Side effects that usually do not require medical attention (Report these to your doctor or health care professional if they continue or are bothersome.): -constipation -diarrhea -dizziness -dry mouth -headache -nausea -trouble sleeping This list may not describe all possible side effects. Call your doctor for medical advice about side effects. You may report side effects to FDA at 1-800-FDA-1088. Where should I keep my medicine? Keep out of the reach of children. Store at room temperature between 15 and 30 degrees C (59 and 86 degrees F). Throw away any unused medicine after the expiration date. NOTE: This sheet is a summary. It may not cover all possible information. If you have questions about this medicine, talk to your doctor, pharmacist, or health care provider.    2016, [employer] Standard. (2013-07-21 15:17:29)  

## 2016-02-23 NOTE — Progress Notes (Signed)
Marie Torres [birth year] [id number]   History:    55 y.o.  for annual gyn exam who is main complaint is weight gain. Also she in frequently has some hot flashes and occasional may have some insomnia. Patient in the past had history of interstitial cystitis and had been on Elmiron which she is currently not taking and is having no symptoms. Patient stated that she had a normal colonoscopy in 2015 but several years prior she had benign colon polyps and thus she is on a 5 year recall. 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 Pap smear last year was normal. Patient recently menopause at the age of 31 has never been on hormone replacement therapy she reports very vague vasomotor symptoms But is now sexually active and is having vaginal dryness and would like some help. Patient had a normal bone density study in 2016.  Past medical history,surgical history, family history and social history were all reviewed and documented in the EPIC chart.  Gynecologic History No LMP recorded. Patient is postmenopausal. Contraception: post menopausal status Last Pap: 2015. Results were: normal Last mammogram: 2016. Results were: normal  Obstetric History OB History  Gravida Para Term Preterm AB SAB TAB Ectopic Multiple Living  1 1 1       1     # Outcome Date GA Lbr Len/2nd Weight Sex Delivery Anes PTL Lv  1 Term      Vag-Spont          ROS: A ROS was performed and pertinent positives and negatives are included in the history.  GENERAL: No fevers or chills. HEENT: No change in vision, no earache, sore throat or sinus congestion. NECK: No pain or stiffness. CARDIOVASCULAR: No chest pain or pressure. No palpitations. PULMONARY: No shortness of breath, cough or wheeze. GASTROINTESTINAL: No abdominal pain, nausea, vomiting or diarrhea, melena or bright red blood per rectum. GENITOURINARY: No urinary frequency, urgency, hesitancy or  dysuria. MUSCULOSKELETAL: No joint or muscle pain, no back pain, no recent trauma. DERMATOLOGIC: No rash, no itching, no lesions. ENDOCRINE: No polyuria, polydipsia, no heat or cold intolerance. No recent change in weight. HEMATOLOGICAL: No anemia or easy bruising or bleeding. NEUROLOGIC: No headache, seizures, numbness, tingling or weakness. PSYCHIATRIC: No depression, no loss of interest in normal activity or change in sleep pattern.     Exam: chaperone present  BP 128/78 mmHg  Ht 5' 1.75" (1.568 m)  Wt 206 lb (93.441 kg)  BMI 38.01 kg/m2  Body mass index is 38.01 kg/(m^2).  General appearance : Well developed well nourished female. No acute distress HEENT: Eyes: no retinal hemorrhage or exudates,  Neck supple, trachea midline, no carotid bruits, no thyroidmegaly Lungs: Clear to auscultation, no rhonchi or wheezes, or rib retractions  Heart: Regular rate and rhythm, no murmurs or gallops Breast:Examined in sitting and supine position were symmetrical in appearance, no palpable masses or tenderness,  no skin retraction, no nipple inversion, no nipple discharge, no skin discoloration, no axillary or supraclavicular lymphadenopathy Abdomen: no palpable masses or tenderness, no rebound or guarding Extremities: no edema or skin discoloration or tenderness  Pelvic: Superior portion of right labia majora there appears to be 2 cm epidermal inclusion cyst  Bartholin, Urethra, Skene Glands: Within normal limits             Vagina: No gross lesions or discharge, vaginal atrophy  Cervix: No gross lesions or discharge  Uterus  anteverted, normal size, shape and consistency, non-tender and mobile  Adnexa  Without masses or tenderness  Anus and perineum  normal   Rectovaginal  normal sphincter tone without palpated masses or tenderness             Hemoccult cards provided     Assessment/Plan:  55 y.o. female for annual exam who is overweight with a BMI of 37.98 kg for meter square. We discussed  importance of healthy nutrition as well as regular exercise. She is going to be started on Contrave as an appetite suppressant. Risk benefits and pros and cons were discussed literature information was provided. She was informed that if in 3 months she has not lost at least 5% of her weight that she needs to discontinue the medication. Her PCP and has been doing her blood work. Pap smear not indicated this year she needs her mammogram at the end of the year. We discussed importance of monthly self breast examination. Patient to return to the office in next few weeks for excision of the right labia majora epidermal inclusion cyst  Terrance Mass MD, 11:49 AM 02/23/2016

## 2016-03-18 ENCOUNTER — Encounter: Payer: Self-pay | Admitting: Gynecology

## 2016-03-18 ENCOUNTER — Ambulatory Visit (INDEPENDENT_AMBULATORY_CARE_PROVIDER_SITE_OTHER): Payer: BLUE CROSS/BLUE SHIELD | Admitting: Gynecology

## 2016-03-18 VITALS — BP 122/80

## 2016-03-18 DIAGNOSIS — N949 Unspecified condition associated with female genital organs and menstrual cycle: Secondary | ICD-10-CM

## 2016-03-18 DIAGNOSIS — N9089 Other specified noninflammatory disorders of vulva and perineum: Secondary | ICD-10-CM

## 2016-03-18 MED ORDER — DOXYCYCLINE HYCLATE 100 MG PO CAPS: 100.0000 mg | ORAL_CAPSULE | Freq: Two times a day (BID) | ORAL | Status: DC

## 2016-03-18 NOTE — Progress Notes (Signed)
    Patient is a 55 year old that was seen in the office on April 20 for her annual exam  Who was found to have a 2 cm epidermal inclusion cyst of the superior portion of the right labia majora and she was here today for incision and drainage. Patient was counseled for the procedure.  Physical Exam  Genitourinary:        and a small vertical incision was made and putrid thick white material extruded. Cultures were obtained. The area was then copiously irrigated with hydrogen peroxide and the defect was packed with a new gauze.  Assessment/plan: Right labia majora large epidermal inclusion cyst. Cultures obtained. Incision and drainage was completed. To protect for possible MRSA she'll be prescribed Vibramycin 100 mg twice a day  For 2 weeks. She'll use antibacterial soap when she base. She also will be apply Neosporin twice a day. For these 2 weeks. She'll remove the packing tomorrow at home.

## 2016-03-18 NOTE — Addendum Note (Signed)
Addended by: Thurnell Garbe A on: 03/18/2016 12:49 PM   Modules accepted: Orders

## 2016-03-18 NOTE — Patient Instructions (Signed)
Doxycycline tablets or capsules What is this medicine? DOXYCYCLINE (dox i SYE kleen) is a tetracycline antibiotic. It kills certain bacteria or stops their growth. It is used to treat many kinds of infections, like dental, skin, respiratory, and urinary tract infections. It also treats acne, Lyme disease, malaria, and certain sexually transmitted infections. This medicine may be used for other purposes; ask your health care provider or pharmacist if you have questions. What should I tell my health care provider before I take this medicine? They need to know if you have any of these conditions: -liver disease -long exposure to sunlight like working outdoors -stomach problems like colitis -an unusual or allergic reaction to doxycycline, tetracycline antibiotics, other medicines, foods, dyes, or preservatives -pregnant or trying to get pregnant -breast-feeding How should I use this medicine? Take this medicine by mouth with a full glass of water. Follow the directions on the prescription label. It is best to take this medicine without food, but if it upsets your stomach take it with food. Take your medicine at regular intervals. Do not take your medicine more often than directed. Take all of your medicine as directed even if you think you are better. Do not skip doses or stop your medicine early. Talk to your pediatrician regarding the use of this medicine in children. While this drug may be prescribed for selected conditions, precautions do apply. Overdosage: If you think you have taken too much of this medicine contact a poison control center or emergency room at once. NOTE: This medicine is only for you. Do not share this medicine with others. What if I miss a dose? If you miss a dose, take it as soon as you can. If it is almost time for your next dose, take only that dose. Do not take double or extra doses. What may interact with this medicine? -antacids -barbiturates -birth control  pills -bismuth subsalicylate -carbamazepine -methoxyflurane -other antibiotics -phenytoin -vitamins that contain iron -warfarin This list may not describe all possible interactions. Give your health care provider a list of all the medicines, herbs, non-prescription drugs, or dietary supplements you use. Also tell them if you smoke, drink alcohol, or use illegal drugs. Some items may interact with your medicine. What should I watch for while using this medicine? Tell your doctor or health care professional if your symptoms do not improve. Do not treat diarrhea with over the counter products. Contact your doctor if you have diarrhea that lasts more than 2 days or if it is severe and watery. Do not take this medicine just before going to bed. It may not dissolve properly when you lay down and can cause pain in your throat. Drink plenty of fluids while taking this medicine to also help reduce irritation in your throat. This medicine can make you more sensitive to the sun. Keep out of the sun. If you cannot avoid being in the sun, wear protective clothing and use sunscreen. Do not use sun lamps or tanning beds/booths. Birth control pills may not work properly while you are taking this medicine. Talk to your doctor about using an extra method of birth control. If you are being treated for a sexually transmitted infection, avoid sexual contact until you have finished your treatment. Your sexual partner may also need treatment. Avoid antacids, aluminum, calcium, magnesium, and iron products for 4 hours before and 2 hours after taking a dose of this medicine. If you are using this medicine to prevent malaria, you should still protect yourself from contact  with mosquitos. Stay in screened-in areas, use mosquito nets, keep your body covered, and use an insect repellent. What side effects may I notice from receiving this medicine? Side effects that you should report to your doctor or health care professional  as soon as possible: -allergic reactions like skin rash, itching or hives, swelling of the face, lips, or tongue -difficulty breathing -fever -itching in the rectal or genital area -pain on swallowing -redness, blistering, peeling or loosening of the skin, including inside the mouth -severe stomach pain or cramps -unusual bleeding or bruising -unusually weak or tired -yellowing of the eyes or skin Side effects that usually do not require medical attention (report to your doctor or health care professional if they continue or are bothersome): -diarrhea -loss of appetite -nausea, vomiting This list may not describe all possible side effects. Call your doctor for medical advice about side effects. You may report side effects to FDA at 1-800-FDA-1088. Where should I keep my medicine? Keep out of the reach of children. Store at room temperature, below 30 degrees C (86 degrees F). Protect from light. Keep container tightly closed. Throw away any unused medicine after the expiration date. Taking this medicine after the expiration date can make you seriously ill. NOTE: This sheet is a summary. It may not cover all possible information. If you have questions about this medicine, talk to your doctor, pharmacist, or health care provider.    2016, Elsevier/Gold Standard. (2015-02-03 12:10:28) Epidermal Cyst An epidermal cyst is sometimes called a sebaceous cyst, epidermal inclusion cyst, or infundibular cyst. These cysts usually contain a substance that looks "pasty" or "cheesy" and may have a bad smell. This substance is a protein called keratin. Epidermal cysts are usually found on the face, neck, or trunk. They may also occur in the vaginal area or other parts of the genitalia of both men and women. Epidermal cysts are usually small, painless, slow-growing bumps or lumps that move freely under the skin. It is important not to try to pop them. This may cause an infection and lead to tenderness and  swelling. CAUSES  Epidermal cysts may be caused by a deep penetrating injury to the skin or a plugged hair follicle, often associated with acne. SYMPTOMS  Epidermal cysts can become inflamed and cause:  Redness.  Tenderness.  Increased temperature of the skin over the bumps or lumps.  Grayish-white, bad smelling material that drains from the bump or lump. DIAGNOSIS  Epidermal cysts are easily diagnosed by your caregiver during an exam. Rarely, a tissue sample (biopsy) may be taken to rule out other conditions that may resemble epidermal cysts. TREATMENT   Epidermal cysts often get better and disappear on their own. They are rarely ever cancerous.  If a cyst becomes infected, it may become inflamed and tender. This may require opening and draining the cyst. Treatment with antibiotics may be necessary. When the infection is gone, the cyst may be removed with minor surgery.  Small, inflamed cysts can often be treated with antibiotics or by injecting steroid medicines.  Sometimes, epidermal cysts become large and bothersome. If this happens, surgical removal in your caregiver's office may be necessary. HOME CARE INSTRUCTIONS  Only take over-the-counter or prescription medicines as directed by your caregiver.  Take your antibiotics as directed. Finish them even if you start to feel better. SEEK MEDICAL CARE IF:   Your cyst becomes tender, red, or swollen.  Your condition is not improving or is getting worse.  You have any other questions  or concerns. MAKE SURE YOU:  Understand these instructions.  Will watch your condition.  Will get help right away if you are not doing well or get worse.   This information is not intended to replace advice given to you by your health care provider. Make sure you discuss any questions you have with your health care provider.   Document Released: 09/14/2004 Document Revised: 01/06/2012 Document Reviewed: 04/22/2011 Elsevier Interactive  Patient Education [employer].

## 2016-03-21 LAB — WOUND CULTURE
Gram Stain: NONE SEEN
Gram Stain: NONE SEEN

## 2016-05-23 ENCOUNTER — Ambulatory Visit (INDEPENDENT_AMBULATORY_CARE_PROVIDER_SITE_OTHER)
Admission: RE | Admit: 2016-05-23 | Discharge: 2016-05-23 | Disposition: A | Discharge status: Home / Self Care | Payer: BLUE CROSS/BLUE SHIELD | Source: Ambulatory Visit | Attending: Internal Medicine | Admitting: Internal Medicine

## 2016-05-23 ENCOUNTER — Ambulatory Visit (INDEPENDENT_AMBULATORY_CARE_PROVIDER_SITE_OTHER): Payer: BLUE CROSS/BLUE SHIELD | Admitting: Internal Medicine

## 2016-05-23 ENCOUNTER — Encounter: Payer: Self-pay | Admitting: Internal Medicine

## 2016-05-23 VITALS — BP 130/76 | HR 64 | Temp 98.7°F | Resp 16 | Wt 203.0 lb

## 2016-05-23 DIAGNOSIS — M25551 Pain in right hip: Secondary | ICD-10-CM

## 2016-05-23 DIAGNOSIS — M5441 Lumbago with sciatica, right side: Secondary | ICD-10-CM

## 2016-05-23 DIAGNOSIS — M25559 Pain in unspecified hip: Secondary | ICD-10-CM | POA: Insufficient documentation

## 2016-05-23 MED ORDER — HYDROCODONE-ACETAMINOPHEN 5-325 MG PO TABS: 1.0000 | ORAL_TABLET | Freq: Four times a day (QID) | ORAL | 0 refills | Status: DC | PRN

## 2016-05-23 MED ORDER — MELOXICAM 15 MG PO TABS: 15.0000 mg | ORAL_TABLET | Freq: Every day | ORAL | 1 refills | Status: DC

## 2016-05-23 NOTE — Progress Notes (Signed)
Subjective:  Patient ID: Marie Torres, female    DOB: October 20, 1961  Age: 55 y.o. MRN: [id number]  CC: Back Pain and Hip Pain   HPI Marie Torres presents for concerns about right lower back and right hip pain for a month. She tells me she had something similar a year ago that was diagnosed and treated his sciatica. She was seen by pain management and had some kind of procedure done that was not helpful. She was feeling well until about a month ago when the symptoms returned. She has had trauma or injury. She complains of a burning/stabbing pain that radiates from the lateral aspect of her right hip into her right thigh. She has tried Aleve without much symptom relief. The pain keeps her awake at night and interferes with her activities during the day. She denies paresthesias in her lower extremities.  Outpatient Medications Prior to Visit  Medication Sig Dispense Refill  . albuterol (PROVENTIL HFA;VENTOLIN HFA) 108 (90 BASE) MCG/ACT inhaler Inhale 2 puffs into the lungs every 4 (four) hours as needed for wheezing or shortness of breath. 1 Inhaler 0  . doxycycline (VIBRAMYCIN) 100 MG capsule Take 1 capsule (100 mg total) by mouth 2 (two) times daily. Take one tablet twice a day for 1 week 14 capsule 0  . ipratropium (ATROVENT) 0.06 % nasal spray Place 2 sprays into both nostrils 4 (four) times daily. 15 mL 12  . loratadine (CLARITIN) 10 MG tablet TAKE 1 TABLET BY MOUTH EVERY DAY 30 tablet 0  . Naltrexone-Bupropion HCl ER 8-90 MG TB12 Wk 1 take one daily Wk 2 Take one BID Wk 3 Take one in am and one in PM Wk 3 take 2 tablets in am and 2 tablets in PM 3070 tablet 10  . PARoxetine Mesylate (BRISDELLE) 7.5 MG CAPS Take 7.5 mg by mouth every morning. (Patient not taking: Reported on 02/23/2016) 30 capsule 11   No facility-administered medications prior to visit.     ROS Review of Systems  Constitutional: Negative for appetite change, chills, fatigue and fever.  HENT: Negative.   Eyes: Negative.   Negative for visual disturbance.  Respiratory: Negative for cough, choking, chest tightness, shortness of breath and stridor.   Cardiovascular: Negative.  Negative for chest pain, palpitations and leg swelling.  Gastrointestinal: Negative.  Negative for abdominal pain, constipation, diarrhea, nausea and vomiting.  Genitourinary: Negative.   Musculoskeletal: Positive for arthralgias and back pain. Negative for joint swelling, myalgias and neck pain.  Skin: Negative.  Negative for color change and rash.  Neurological: Negative for dizziness, tremors, weakness, numbness and headaches.  Hematological: Negative.  Negative for adenopathy. Does not bruise/bleed easily.  Psychiatric/Behavioral: Negative.     Objective:  BP 130/76   Pulse 64   Temp 98.7 F (37.1 C) (Oral)   Resp 16   Wt 203 lb (92.1 kg)   SpO2 98%   BMI 37.43 kg/m   BP Readings from Last 3 Encounters:  05/23/16 130/76  03/18/16 122/80  02/23/16 128/78    Wt Readings from Last 3 Encounters:  05/23/16 203 lb (92.1 kg)  02/23/16 206 lb (93.4 kg)  10/06/15 209 lb (94.8 kg)    Physical Exam  Constitutional: She is oriented to person, place, and time. No distress.  HENT:  Mouth/Throat: Oropharynx is clear and moist. No oropharyngeal exudate.  Eyes: Conjunctivae are normal. Right eye exhibits no discharge. Left eye exhibits no discharge. No scleral icterus.  Neck: Normal range of motion. Neck supple. No JVD  present. No tracheal deviation present. No thyromegaly present.  Cardiovascular: Normal rate, regular rhythm, normal heart sounds and intact distal pulses.  Exam reveals no gallop and no friction rub.   No murmur heard. Pulmonary/Chest: Effort normal and breath sounds normal. No stridor. No respiratory distress. She has no wheezes. She has no rales. She exhibits no tenderness.  Abdominal: Soft. Bowel sounds are normal. She exhibits no distension and no mass. There is no tenderness. There is no rebound and no guarding.    Musculoskeletal: Normal range of motion. She exhibits no edema, tenderness or deformity.  Lymphadenopathy:    She has no cervical adenopathy.  Neurological: She is oriented to person, place, and time. She displays no atrophy and normal reflexes. No cranial nerve deficit or sensory deficit. She exhibits normal muscle tone. Coordination and gait normal.  Neg SLR in BLE  Skin: Skin is warm and dry. No rash noted. She is not diaphoretic. No erythema.  Vitals reviewed.   Lab Results  Component Value Date   WBC 6.5 02/09/2015   HGB 13.8 02/09/2015   HCT 42.1 02/09/2015   PLT 371 02/09/2015   GLUCOSE 95 02/09/2015   CHOL 217 (H) 08/14/2015   TRIG 113 08/14/2015   HDL 53 08/14/2015   LDLDIRECT 150.8 11/30/2013   LDLCALC 141 (H) 08/14/2015   ALT 14 02/09/2015   AST 20 02/09/2015   NA 138 02/09/2015   K 4.6 02/09/2015   CL 106 02/09/2015   CREATININE 0.78 02/09/2015   BUN 16 02/09/2015   CO2 25 02/09/2015   TSH 0.805 02/09/2015   Dg Lumbar Spine Complete  Result Date: 05/23/2016 CLINICAL DATA:  Pain. EXAM: LUMBAR SPINE - COMPLETE 4+ VIEW COMPARISON:  CT 05/17/2014 . FINDINGS: Soft tissue structures are unremarkable. Large amount stool noted throughout the colon. No acute bony abnormality identified. Aorto iliac atherosclerotic vascular disease. IMPRESSION: Diffuse multilevel degenerative change.  No acute bony abnormality. 2.  Aortoiliac atherosclerotic vascular disease. 3. Large amount stool noted throughout the colon suggesting constipation. Electronically Signed   By: Marcello Moores  Register   On: 05/23/2016 10:59  Dg Femur, Min 2 Views Right  Result Date: 05/23/2016 CLINICAL DATA:  Pain.  No recent injury. EXAM: RIGHT FEMUR 2 VIEWS COMPARISON:  No recent prior FINDINGS: There is no evidence of fracture or other focal bone lesions. Soft tissues are unremarkable. IMPRESSION: No acute abnormality. Electronically Signed   By: Marcello Moores  Register   On: 05/23/2016 11:00  No results  found.  Assessment & Plan:   [name] was seen today for back pain and hip pain.  Diagnoses and all orders for this visit:  Right-sided low back pain with right-sided sciatica- Plain x-ray shows multilevel degenerative disc disease, will start meloxicam and Norco for symptom relief. -     DG Lumbar Spine Complete; Future -     meloxicam (MOBIC) 15 MG tablet; Take 1 tablet (15 mg total) by mouth daily. -     HYDROcodone-acetaminophen (NORCO/VICODIN) 5-325 MG tablet; Take 1 tablet by mouth every 6 (six) hours as needed for moderate pain.  Hip pain, acute, right- exam and x-ray are normal, I think this is referred pain from her back, will treat as above. -     DG FEMUR, MIN 2 VIEWS RIGHT; Future -     meloxicam (MOBIC) 15 MG tablet; Take 1 tablet (15 mg total) by mouth daily. -     HYDROcodone-acetaminophen (NORCO/VICODIN) 5-325 MG tablet; Take 1 tablet by mouth every 6 (six) hours as  needed for moderate pain.   I have discontinued Ms. Koike's PARoxetine Mesylate, albuterol, ipratropium, loratadine, Naltrexone-Bupropion HCl ER, and doxycycline. I am also having her start on meloxicam and HYDROcodone-acetaminophen.  Meds ordered this encounter  Medications  . meloxicam (MOBIC) 15 MG tablet    Sig: Take 1 tablet (15 mg total) by mouth daily.    Dispense:  90 tablet    Refill:  1  . HYDROcodone-acetaminophen (NORCO/VICODIN) 5-325 MG tablet    Sig: Take 1 tablet by mouth every 6 (six) hours as needed for moderate pain.    Dispense:  65 tablet    Refill:  0     Follow-up: Return in about 2 months (around 07/24/2016).  Scarlette Calico, MD

## 2016-05-23 NOTE — Progress Notes (Signed)
Pre visit review using our clinic review tool, if applicable. No additional management support is needed unless otherwise documented below in the visit note. 

## 2016-05-23 NOTE — Patient Instructions (Signed)

## 2016-06-04 ENCOUNTER — Other Ambulatory Visit (INDEPENDENT_AMBULATORY_CARE_PROVIDER_SITE_OTHER): Payer: BLUE CROSS/BLUE SHIELD

## 2016-06-04 ENCOUNTER — Ambulatory Visit (INDEPENDENT_AMBULATORY_CARE_PROVIDER_SITE_OTHER): Payer: BLUE CROSS/BLUE SHIELD | Admitting: Internal Medicine

## 2016-06-04 ENCOUNTER — Encounter: Payer: Self-pay | Admitting: Internal Medicine

## 2016-06-04 VITALS — BP 118/74 | HR 65 | Temp 98.1°F | Resp 16 | Ht 61.0 in | Wt 207.5 lb

## 2016-06-04 DIAGNOSIS — Z0000 Encounter for general adult medical examination without abnormal findings: Secondary | ICD-10-CM

## 2016-06-04 DIAGNOSIS — Z1231 Encounter for screening mammogram for malignant neoplasm of breast: Secondary | ICD-10-CM

## 2016-06-04 LAB — CBC WITH DIFFERENTIAL/PLATELET
Basophils Absolute: 0 10*3/uL (ref 0.0–0.1)
Basophils Relative: 0.3 % (ref 0.0–3.0)
EOS ABS: 0.1 10*3/uL (ref 0.0–0.7)
Eosinophils Relative: 1.4 % (ref 0.0–5.0)
HCT: 38.7 % (ref 36.0–46.0)
Hemoglobin: 13.1 g/dL (ref 12.0–15.0)
LYMPHS ABS: 2.4 10*3/uL (ref 0.7–4.0)
Lymphocytes Relative: 25 % (ref 12.0–46.0)
MCHC: 33.9 g/dL (ref 30.0–36.0)
MCV: 84.3 fl (ref 78.0–100.0)
MONOS PCT: 6 % (ref 3.0–12.0)
Monocytes Absolute: 0.6 10*3/uL (ref 0.1–1.0)
Neutro Abs: 6.5 10*3/uL (ref 1.4–7.7)
Neutrophils Relative %: 67.3 % (ref 43.0–77.0)
Platelets: 314 10*3/uL (ref 150.0–400.0)
RBC: 4.59 Mil/uL (ref 3.87–5.11)
RDW: 12.6 % (ref 11.5–15.5)
WBC: 9.6 10*3/uL (ref 4.0–10.5)

## 2016-06-04 LAB — COMPREHENSIVE METABOLIC PANEL
ALK PHOS: 63 U/L (ref 39–117)
ALT: 11 U/L (ref 0–35)
AST: 16 U/L (ref 0–37)
Albumin: 3.8 g/dL (ref 3.5–5.2)
BUN: 16 mg/dL (ref 6–23)
CALCIUM: 9.5 mg/dL (ref 8.4–10.5)
CO2: 27 mEq/L (ref 19–32)
CREATININE: 0.82 mg/dL (ref 0.40–1.20)
Chloride: 106 mEq/L (ref 96–112)
GFR: 92.38 mL/min (ref >60.00)
Glucose, Bld: 93 mg/dL (ref 70–99)
Potassium: 4 mEq/L (ref 3.5–5.1)
Sodium: 141 mEq/L (ref 135–145)
TOTAL PROTEIN: 7.7 g/dL (ref 6.0–8.3)
Total Bilirubin: 0.5 mg/dL (ref 0.2–1.2)

## 2016-06-04 LAB — LIPID PANEL
Cholesterol: 208 mg/dL — ABNORMAL HIGH (ref 0–200)
HDL: 47.5 mg/dL (ref >39.00)
LDL Cholesterol: 129 mg/dL — ABNORMAL HIGH (ref 0–99)
NonHDL: 160.89
TRIGLYCERIDES: 159 mg/dL — AB (ref 0.0–149.0)
Total CHOL/HDL Ratio: 4
VLDL: 31.8 mg/dL (ref 0.0–40.0)

## 2016-06-04 LAB — TSH: TSH: 1.3 u[IU]/mL (ref 0.35–4.50)

## 2016-06-04 NOTE — Progress Notes (Signed)
Pre visit review using our clinic review tool, if applicable. No additional management support is needed unless otherwise documented below in the visit note. 

## 2016-06-04 NOTE — Patient Instructions (Signed)
Preventive Care for Adults, Female A healthy lifestyle and preventive care can promote health and wellness. Preventive health guidelines for women include the following key practices.  A routine yearly physical is a good way to check with your health care provider about your health and preventive screening. It is a chance to share any concerns and updates on your health and to receive a thorough exam.  Visit your dentist for a routine exam and preventive care every 6 months. Brush your teeth twice a day and floss once a day. Good oral hygiene prevents tooth decay and gum disease.  The frequency of eye exams is based on your age, health, family medical history, use of contact lenses, and other factors. Follow your health care provider's recommendations for frequency of eye exams.  Eat a healthy diet. Foods like vegetables, fruits, whole grains, low-fat dairy products, and lean protein foods contain the nutrients you need without too many calories. Decrease your intake of foods high in solid fats, added sugars, and salt. Eat the right amount of calories for you.Get information about a proper diet from your health care provider, if necessary.  Regular physical exercise is one of the most important things you can do for your health. Most adults should get at least 150 minutes of moderate-intensity exercise (any activity that increases your heart rate and causes you to sweat) each week. In addition, most adults need muscle-strengthening exercises on 2 or more days a week.  Maintain a healthy weight. The body mass index (BMI) is a screening tool to identify possible weight problems. It provides an estimate of body fat based on height and weight. Your health care provider can find your BMI and can help you achieve or maintain a healthy weight.For adults 20 years and older:  A BMI below 18.5 is considered underweight.  A BMI of 18.5 to 24.9 is normal.  A BMI of 25 to 29.9 is considered overweight.  A  BMI of 30 and above is considered obese.  Maintain normal blood lipids and cholesterol levels by exercising and minimizing your intake of saturated fat. Eat a balanced diet with plenty of fruit and vegetables. Blood tests for lipids and cholesterol should begin at age 45 and be repeated every 5 years. If your lipid or cholesterol levels are high, you are over 50, or you are at high risk for heart disease, you may need your cholesterol levels checked more frequently.Ongoing high lipid and cholesterol levels should be treated with medicines if diet and exercise are not working.  If you smoke, find out from your health care provider how to quit. If you do not use tobacco, do not start.  Lung cancer screening is recommended for adults aged 45-80 years who are at high risk for developing lung cancer because of a history of smoking. A yearly low-dose CT scan of the lungs is recommended for people who have at least a 30-pack-year history of smoking and are a current smoker or have quit within the past 15 years. A pack year of smoking is smoking an average of 1 pack of cigarettes a day for 1 year (for example: 1 pack a day for 30 years or 2 packs a day for 15 years). Yearly screening should continue until the smoker has stopped smoking for at least 15 years. Yearly screening should be stopped for people who develop a health problem that would prevent them from having lung cancer treatment.  If you are pregnant, do not drink alcohol. If you are  breastfeeding, be very cautious about drinking alcohol. If you are not pregnant and choose to drink alcohol, do not have more than 1 drink per day. One drink is considered to be 12 ounces (355 mL) of beer, 5 ounces (148 mL) of wine, or 1.5 ounces (44 mL) of liquor.  Avoid use of street drugs. Do not share needles with anyone. Ask for help if you need support or instructions about stopping the use of drugs.  High blood pressure causes heart disease and increases the risk  of stroke. Your blood pressure should be checked at least every 1 to 2 years. Ongoing high blood pressure should be treated with medicines if weight loss and exercise do not work.  If you are 55-79 years old, ask your health care provider if you should take aspirin to prevent strokes.  Diabetes screening is done by taking a blood sample to check your blood glucose level after you have not eaten for a certain period of time (fasting). If you are not overweight and you do not have risk factors for diabetes, you should be screened once every 3 years starting at age 45. If you are overweight or obese and you are 40-70 years of age, you should be screened for diabetes every year as part of your cardiovascular risk assessment.  Breast cancer screening is essential preventive care for women. You should practice "breast self-awareness." This means understanding the normal appearance and feel of your breasts and may include breast self-examination. Any changes detected, no matter how small, should be reported to a health care provider. Women in their 20s and 30s should have a clinical breast exam (CBE) by a health care provider as part of a regular health exam every 1 to 3 years. After age 40, women should have a CBE every year. Starting at age 40, women should consider having a mammogram (breast X-ray test) every year. Women who have a family history of breast cancer should talk to their health care provider about genetic screening. Women at a high risk of breast cancer should talk to their health care providers about having an MRI and a mammogram every year.  Breast cancer gene (BRCA)-related cancer risk assessment is recommended for women who have family members with BRCA-related cancers. BRCA-related cancers include breast, ovarian, tubal, and peritoneal cancers. Having family members with these cancers may be associated with an increased risk for harmful changes (mutations) in the breast cancer genes BRCA1 and  BRCA2. Results of the assessment will determine the need for genetic counseling and BRCA1 and BRCA2 testing.  Your health care provider may recommend that you be screened regularly for cancer of the pelvic organs (ovaries, uterus, and vagina). This screening involves a pelvic examination, including checking for microscopic changes to the surface of your cervix (Pap test). You may be encouraged to have this screening done every 3 years, beginning at age 21.  For women ages 30-65, health care providers may recommend pelvic exams and Pap testing every 3 years, or they may recommend the Pap and pelvic exam, combined with testing for human papilloma virus (HPV), every 5 years. Some types of HPV increase your risk of cervical cancer. Testing for HPV may also be done on women of any age with unclear Pap test results.  Other health care providers may not recommend any screening for nonpregnant women who are considered low risk for pelvic cancer and who do not have symptoms. Ask your health care provider if a screening pelvic exam is right for   you.  If you have had past treatment for cervical cancer or a condition that could lead to cancer, you need Pap tests and screening for cancer for at least 20 years after your treatment. If Pap tests have been discontinued, your risk factors (such as having a new sexual partner) need to be reassessed to determine if screening should resume. Some women have medical problems that increase the chance of getting cervical cancer. In these cases, your health care provider may recommend more frequent screening and Pap tests.  Colorectal cancer can be detected and often prevented. Most routine colorectal cancer screening begins at the age of 50 years and continues through age 75 years. However, your health care provider may recommend screening at an earlier age if you have risk factors for colon cancer. On a yearly basis, your health care provider may provide home test kits to check  for hidden blood in the stool. Use of a small camera at the end of a tube, to directly examine the colon (sigmoidoscopy or colonoscopy), can detect the earliest forms of colorectal cancer. Talk to your health care provider about this at age 50, when routine screening begins. Direct exam of the colon should be repeated every 5-10 years through age 75 years, unless early forms of precancerous polyps or small growths are found.  People who are at an increased risk for hepatitis B should be screened for this virus. You are considered at high risk for hepatitis B if:  You were born in a country where hepatitis B occurs often. Talk with your health care provider about which countries are considered high risk.  Your parents were born in a high-risk country and you have not received a shot to protect against hepatitis B (hepatitis B vaccine).  You have HIV or AIDS.  You use needles to inject street drugs.  You live with, or have sex with, someone who has hepatitis B.  You get hemodialysis treatment.  You take certain medicines for conditions like cancer, organ transplantation, and autoimmune conditions.  Hepatitis C blood testing is recommended for all people born from 1945 through 1965 and any individual with known risks for hepatitis C.  Practice safe sex. Use condoms and avoid high-risk sexual practices to reduce the spread of sexually transmitted infections (STIs). STIs include gonorrhea, chlamydia, syphilis, trichomonas, herpes, HPV, and human immunodeficiency virus (HIV). Herpes, HIV, and HPV are viral illnesses that have no cure. They can result in disability, cancer, and death.  You should be screened for sexually transmitted illnesses (STIs) including gonorrhea and chlamydia if:  You are sexually active and are younger than 24 years.  You are older than 24 years and your health care provider tells you that you are at risk for this type of infection.  Your sexual activity has changed  since you were last screened and you are at an increased risk for chlamydia or gonorrhea. Ask your health care provider if you are at risk.  If you are at risk of being infected with HIV, it is recommended that you take a prescription medicine daily to prevent HIV infection. This is called preexposure prophylaxis (PrEP). You are considered at risk if:  You are sexually active and do not regularly use condoms or know the HIV status of your partner(s).  You take drugs by injection.  You are sexually active with a partner who has HIV.  Talk with your health care provider about whether you are at high risk of being infected with HIV. If   you choose to begin PrEP, you should first be tested for HIV. You should then be tested every 3 months for as long as you are taking PrEP.  Osteoporosis is a disease in which the bones lose minerals and strength with aging. This can result in serious bone fractures or breaks. The risk of osteoporosis can be identified using a bone density scan. Women ages 67 years and over and women at risk for fractures or osteoporosis should discuss screening with their health care providers. Ask your health care provider whether you should take a calcium supplement or vitamin D to reduce the rate of osteoporosis.  Menopause can be associated with physical symptoms and risks. Hormone replacement therapy is available to decrease symptoms and risks. You should talk to your health care provider about whether hormone replacement therapy is right for you.  Use sunscreen. Apply sunscreen liberally and repeatedly throughout the day. You should seek shade when your shadow is shorter than you. Protect yourself by wearing long sleeves, pants, a wide-brimmed hat, and sunglasses year round, whenever you are outdoors.  Once a month, do a whole body skin exam, using a mirror to look at the skin on your back. Tell your health care provider of new moles, moles that have irregular borders, moles that  are larger than a pencil eraser, or moles that have changed in shape or color.  Stay current with required vaccines (immunizations).  Influenza vaccine. All adults should be immunized every year.  Tetanus, diphtheria, and acellular pertussis (Td, Tdap) vaccine. Pregnant women should receive 1 dose of Tdap vaccine during each pregnancy. The dose should be obtained regardless of the length of time since the last dose. Immunization is preferred during the 27th-36th week of gestation. An adult who has not previously received Tdap or who does not know her vaccine status should receive 1 dose of Tdap. This initial dose should be followed by tetanus and diphtheria toxoids (Td) booster doses every 10 years. Adults with an unknown or incomplete history of completing a 3-dose immunization series with Td-containing vaccines should begin or complete a primary immunization series including a Tdap dose. Adults should receive a Td booster every 10 years.  Varicella vaccine. An adult without evidence of immunity to varicella should receive 2 doses or a second dose if she has previously received 1 dose. Pregnant females who do not have evidence of immunity should receive the first dose after pregnancy. This first dose should be obtained before leaving the health care facility. The second dose should be obtained 4-8 weeks after the first dose.  Human papillomavirus (HPV) vaccine. Females aged 13-26 years who have not received the vaccine previously should obtain the 3-dose series. The vaccine is not recommended for use in pregnant females. However, pregnancy testing is not needed before receiving a dose. If a female is found to be pregnant after receiving a dose, no treatment is needed. In that case, the remaining doses should be delayed until after the pregnancy. Immunization is recommended for any person with an immunocompromised condition through the age of 61 years if she did not get any or all doses earlier. During the  3-dose series, the second dose should be obtained 4-8 weeks after the first dose. The third dose should be obtained 24 weeks after the first dose and 16 weeks after the second dose.  Zoster vaccine. One dose is recommended for adults aged 30 years or older unless certain conditions are present.  Measles, mumps, and rubella (MMR) vaccine. Adults born  before 1957 generally are considered immune to measles and mumps. Adults born in 1957 or later should have 1 or more doses of MMR vaccine unless there is a contraindication to the vaccine or there is laboratory evidence of immunity to each of the three diseases. A routine second dose of MMR vaccine should be obtained at least 28 days after the first dose for students attending postsecondary schools, health care workers, or international travelers. People who received inactivated measles vaccine or an unknown type of measles vaccine during 1963-1967 should receive 2 doses of MMR vaccine. People who received inactivated mumps vaccine or an unknown type of mumps vaccine before 1979 and are at high risk for mumps infection should consider immunization with 2 doses of MMR vaccine. For females of childbearing age, rubella immunity should be determined. If there is no evidence of immunity, females who are not pregnant should be vaccinated. If there is no evidence of immunity, females who are pregnant should delay immunization until after pregnancy. Unvaccinated health care workers born before 1957 who lack laboratory evidence of measles, mumps, or rubella immunity or laboratory confirmation of disease should consider measles and mumps immunization with 2 doses of MMR vaccine or rubella immunization with 1 dose of MMR vaccine.  Pneumococcal 13-valent conjugate (PCV13) vaccine. When indicated, a person who is uncertain of his immunization history and has no record of immunization should receive the PCV13 vaccine. All adults 65 years of age and older should receive this  vaccine. An adult aged 19 years or older who has certain medical conditions and has not been previously immunized should receive 1 dose of PCV13 vaccine. This PCV13 should be followed with a dose of pneumococcal polysaccharide (PPSV23) vaccine. Adults who are at high risk for pneumococcal disease should obtain the PPSV23 vaccine at least 8 weeks after the dose of PCV13 vaccine. Adults older than 57 years of age who have normal immune system function should obtain the PPSV23 vaccine dose at least 1 year after the dose of PCV13 vaccine.  Pneumococcal polysaccharide (PPSV23) vaccine. When PCV13 is also indicated, PCV13 should be obtained first. All adults aged 65 years and older should be immunized. An adult younger than age 65 years who has certain medical conditions should be immunized. Any person who resides in a nursing home or long-term care facility should be immunized. An adult smoker should be immunized. People with an immunocompromised condition and certain other conditions should receive both PCV13 and PPSV23 vaccines. People with human immunodeficiency virus (HIV) infection should be immunized as soon as possible after diagnosis. Immunization during chemotherapy or radiation therapy should be avoided. Routine use of PPSV23 vaccine is not recommended for American Indians, [place] Natives, or people younger than 65 years unless there are medical conditions that require PPSV23 vaccine. When indicated, people who have unknown immunization and have no record of immunization should receive PPSV23 vaccine. One-time revaccination 5 years after the first dose of PPSV23 is recommended for people aged 19-64 years who have chronic kidney failure, nephrotic syndrome, asplenia, or immunocompromised conditions. People who received 1-2 doses of PPSV23 before age 65 years should receive another dose of PPSV23 vaccine at age 65 years or later if at least 5 years have passed since the previous dose. Doses of PPSV23 are not  needed for people immunized with PPSV23 at or after age 65 years.  Meningococcal vaccine. Adults with asplenia or persistent complement component deficiencies should receive 2 doses of quadrivalent meningococcal conjugate (MenACWY-D) vaccine. The doses should be obtained   at least 2 months apart. Microbiologists working with certain meningococcal bacteria, [place] recruits, people at risk during an outbreak, and people who travel to or live in countries with a high rate of meningitis should be immunized. A first-year college student up through age 34 years who is living in a residence hall should receive a dose if she did not receive a dose on or after her 16th birthday. Adults who have certain high-risk conditions should receive one or more doses of vaccine.  Hepatitis A vaccine. Adults who wish to be protected from this disease, have certain high-risk conditions, work with hepatitis A-infected animals, work in hepatitis A research labs, or travel to or work in countries with a high rate of hepatitis A should be immunized. Adults who were previously unvaccinated and who anticipate close contact with an international adoptee during the first 60 days after arrival in the [place] from a country with a high rate of hepatitis A should be immunized.  Hepatitis B vaccine. Adults who wish to be protected from this disease, have certain high-risk conditions, may be exposed to blood or other infectious body fluids, are household contacts or sex partners of hepatitis B positive people, are clients or workers in certain care facilities, or travel to or work in countries with a high rate of hepatitis B should be immunized.  Haemophilus influenzae type b (Hib) vaccine. A previously unvaccinated person with asplenia or sickle cell disease or having a scheduled splenectomy should receive 1 dose of Hib vaccine. Regardless of previous immunization, a recipient of a hematopoietic stem cell transplant should receive a  3-dose series 6-12 months after her successful transplant. Hib vaccine is not recommended for adults with HIV infection. Preventive Services / Frequency Ages 35 to 4 years  Blood pressure check.** / Every 3-5 years.  Lipid and cholesterol check.** / Every 5 years beginning at age 60.  Clinical breast exam.** / Every 3 years for women in their 71s and 10s.  BRCA-related cancer risk assessment.** / For women who have family members with a BRCA-related cancer (breast, ovarian, tubal, or peritoneal cancers).  Pap test.** / Every 2 years from ages 76 through 26. Every 3 years starting at age 61 through age 76 or 93 with a history of 3 consecutive normal Pap tests.  HPV screening.** / Every 3 years from ages 37 through ages 60 to 51 with a history of 3 consecutive normal Pap tests.  Hepatitis C blood test.** / For any individual with known risks for hepatitis C.  Skin self-exam. / Monthly.  Influenza vaccine. / Every year.  Tetanus, diphtheria, and acellular pertussis (Tdap, Td) vaccine.** / Consult your health care provider. Pregnant women should receive 1 dose of Tdap vaccine during each pregnancy. 1 dose of Td every 10 years.  Varicella vaccine.** / Consult your health care provider. Pregnant females who do not have evidence of immunity should receive the first dose after pregnancy.  HPV vaccine. / 3 doses over 6 months, if 93 and younger. The vaccine is not recommended for use in pregnant females. However, pregnancy testing is not needed before receiving a dose.  Measles, mumps, rubella (MMR) vaccine.** / You need at least 1 dose of MMR if you were born in 1957 or later. You may also need a 2nd dose. For females of childbearing age, rubella immunity should be determined. If there is no evidence of immunity, females who are not pregnant should be vaccinated. If there is no evidence of immunity, females who are  pregnant should delay immunization until after pregnancy.  Pneumococcal  13-valent conjugate (PCV13) vaccine.** / Consult your health care provider.  Pneumococcal polysaccharide (PPSV23) vaccine.** / 1 to 2 doses if you smoke cigarettes or if you have certain conditions.  Meningococcal vaccine.** / 1 dose if you are age 68 to 8 years and a Market researcher living in a residence hall, or have one of several medical conditions, you need to get vaccinated against meningococcal disease. You may also need additional booster doses.  Hepatitis A vaccine.** / Consult your health care provider.  Hepatitis B vaccine.** / Consult your health care provider.  Haemophilus influenzae type b (Hib) vaccine.** / Consult your health care provider. Ages 7 to 53 years  Blood pressure check.** / Every year.  Lipid and cholesterol check.** / Every 5 years beginning at age 25 years.  Lung cancer screening. / Every year if you are aged 11-80 years and have a 30-pack-year history of smoking and currently smoke or have quit within the past 15 years. Yearly screening is stopped once you have quit smoking for at least 15 years or develop a health problem that would prevent you from having lung cancer treatment.  Clinical breast exam.** / Every year after age 48 years.  BRCA-related cancer risk assessment.** / For women who have family members with a BRCA-related cancer (breast, ovarian, tubal, or peritoneal cancers).  Mammogram.** / Every year beginning at age 41 years and continuing for as long as you are in good health. Consult with your health care provider.  Pap test.** / Every 3 years starting at age 65 years through age 37 or 70 years with a history of 3 consecutive normal Pap tests.  HPV screening.** / Every 3 years from ages 72 years through ages 60 to 40 years with a history of 3 consecutive normal Pap tests.  Fecal occult blood test (FOBT) of stool. / Every year beginning at age 21 years and continuing until age 5 years. You may not need to do this test if you get  a colonoscopy every 10 years.  Flexible sigmoidoscopy or colonoscopy.** / Every 5 years for a flexible sigmoidoscopy or every 10 years for a colonoscopy beginning at age 35 years and continuing until age 48 years.  Hepatitis C blood test.** / For all people born from [birth year] through 1965 and any individual with known risks for hepatitis C.  Skin self-exam. / Monthly.  Influenza vaccine. / Every year.  Tetanus, diphtheria, and acellular pertussis (Tdap/Td) vaccine.** / Consult your health care provider. Pregnant women should receive 1 dose of Tdap vaccine during each pregnancy. 1 dose of Td every 10 years.  Varicella vaccine.** / Consult your health care provider. Pregnant females who do not have evidence of immunity should receive the first dose after pregnancy.  Zoster vaccine.** / 1 dose for adults aged 30 years or older.  Measles, mumps, rubella (MMR) vaccine.** / You need at least 1 dose of MMR if you were born in 1957 or later. You may also need a second dose. For females of childbearing age, rubella immunity should be determined. If there is no evidence of immunity, females who are not pregnant should be vaccinated. If there is no evidence of immunity, females who are pregnant should delay immunization until after pregnancy.  Pneumococcal 13-valent conjugate (PCV13) vaccine.** / Consult your health care provider.  Pneumococcal polysaccharide (PPSV23) vaccine.** / 1 to 2 doses if you smoke cigarettes or if you have certain conditions.  Meningococcal vaccine.** /  Consult your health care provider.  Hepatitis A vaccine.** / Consult your health care provider.  Hepatitis B vaccine.** / Consult your health care provider.  Haemophilus influenzae type b (Hib) vaccine.** / Consult your health care provider. Ages 64 years and over  Blood pressure check.** / Every year.  Lipid and cholesterol check.** / Every 5 years beginning at age 23 years.  Lung cancer screening. / Every year if you  are aged 16-80 years and have a 30-pack-year history of smoking and currently smoke or have quit within the past 15 years. Yearly screening is stopped once you have quit smoking for at least 15 years or develop a health problem that would prevent you from having lung cancer treatment.  Clinical breast exam.** / Every year after age 74 years.  BRCA-related cancer risk assessment.** / For women who have family members with a BRCA-related cancer (breast, ovarian, tubal, or peritoneal cancers).  Mammogram.** / Every year beginning at age 44 years and continuing for as long as you are in good health. Consult with your health care provider.  Pap test.** / Every 3 years starting at age 58 years through age 22 or 39 years with 3 consecutive normal Pap tests. Testing can be stopped between 65 and 70 years with 3 consecutive normal Pap tests and no abnormal Pap or HPV tests in the past 10 years.  HPV screening.** / Every 3 years from ages 64 years through ages 70 or 61 years with a history of 3 consecutive normal Pap tests. Testing can be stopped between 65 and 70 years with 3 consecutive normal Pap tests and no abnormal Pap or HPV tests in the past 10 years.  Fecal occult blood test (FOBT) of stool. / Every year beginning at age 40 years and continuing until age 27 years. You may not need to do this test if you get a colonoscopy every 10 years.  Flexible sigmoidoscopy or colonoscopy.** / Every 5 years for a flexible sigmoidoscopy or every 10 years for a colonoscopy beginning at age 7 years and continuing until age 32 years.  Hepatitis C blood test.** / For all people born from [birth year] through 1965 and any individual with known risks for hepatitis C.  Osteoporosis screening.** / A one-time screening for women ages 30 years and over and women at risk for fractures or osteoporosis.  Skin self-exam. / Monthly.  Influenza vaccine. / Every year.  Tetanus, diphtheria, and acellular pertussis (Tdap/Td)  vaccine.** / 1 dose of Td every 10 years.  Varicella vaccine.** / Consult your health care provider.  Zoster vaccine.** / 1 dose for adults aged 35 years or older.  Pneumococcal 13-valent conjugate (PCV13) vaccine.** / Consult your health care provider.  Pneumococcal polysaccharide (PPSV23) vaccine.** / 1 dose for all adults aged 46 years and older.  Meningococcal vaccine.** / Consult your health care provider.  Hepatitis A vaccine.** / Consult your health care provider.  Hepatitis B vaccine.** / Consult your health care provider.  Haemophilus influenzae type b (Hib) vaccine.** / Consult your health care provider. ** Family history and personal history of risk and conditions may change your health care provider's recommendations.   This information is not intended to replace advice given to you by your health care provider. Make sure you discuss any questions you have with your health care provider.   Document Released: 12/10/2001 Document Revised: 11/04/2014 Document Reviewed: 03/11/2011 Elsevier Interactive Patient Education [employer].

## 2016-06-04 NOTE — Progress Notes (Signed)
Subjective:  Patient ID: Marie Torres, female    DOB: 05-26-1961  Age: 55 y.o. MRN: [id number]  CC: Annual Exam   HPI Marie Torres presents for a CPX, She is tearful today because her sister was buried one day prior to this visit, she had been treated for lung cancer and developed what  sounds like an infection and died recently. Other than that the patient feels well and offers no complaints.  Outpatient Medications Prior to Visit  Medication Sig Dispense Refill  . HYDROcodone-acetaminophen (NORCO/VICODIN) 5-325 MG tablet Take 1 tablet by mouth every 6 (six) hours as needed for moderate pain. 65 tablet 0  . meloxicam (MOBIC) 15 MG tablet Take 1 tablet (15 mg total) by mouth daily. 90 tablet 1   No facility-administered medications prior to visit.     ROS Review of Systems  Constitutional: Negative for appetite change, diaphoresis and fatigue.  HENT: Negative.   Eyes: Negative.   Respiratory: Negative for cough, choking, chest tightness, shortness of breath and stridor.   Cardiovascular: Negative.  Negative for chest pain, palpitations and leg swelling.  Gastrointestinal: Negative.  Negative for abdominal pain, diarrhea, nausea and vomiting.  Endocrine: Negative.   Genitourinary: Negative.  Negative for difficulty urinating, flank pain and vaginal discharge.  Musculoskeletal: Negative.  Negative for back pain, myalgias and neck pain.  Skin: Negative.  Negative for color change and rash.  Allergic/Immunologic: Negative.   Neurological: Negative.   Hematological: Negative.  Negative for adenopathy. Does not bruise/bleed easily.  Psychiatric/Behavioral: Negative.     Objective:  BP 118/74 (BP Location: Left Arm, Patient Position: Sitting, Cuff Size: Large)   Pulse 65   Temp 98.1 F (36.7 C) (Oral)   Resp 16   Ht 5\' 1"  (1.549 m)   Wt 207 lb 8 oz (94.1 kg)   SpO2 95%   BMI 39.21 kg/m   BP Readings from Last 3 Encounters:  06/04/16 118/74  05/23/16 130/76  03/18/16  122/80    Wt Readings from Last 3 Encounters:  06/04/16 207 lb 8 oz (94.1 kg)  05/23/16 203 lb (92.1 kg)  02/23/16 206 lb (93.4 kg)    Physical Exam  Constitutional: She is oriented to person, place, and time. No distress.  HENT:  Mouth/Throat: Oropharynx is clear and moist. No oropharyngeal exudate.  Eyes: Conjunctivae are normal. Right eye exhibits no discharge. Left eye exhibits no discharge. No scleral icterus.  Neck: Normal range of motion. Neck supple. No JVD present. No tracheal deviation present. No thyromegaly present.  Cardiovascular: Normal rate, regular rhythm, normal heart sounds and intact distal pulses.  Exam reveals no gallop and no friction rub.   No murmur heard. Pulmonary/Chest: Effort normal and breath sounds normal. No stridor. No respiratory distress. She has no wheezes. She has no rales. She exhibits no tenderness.  Abdominal: Soft. Bowel sounds are normal. She exhibits no distension and no mass. There is no tenderness. There is no rebound and no guarding.  Musculoskeletal: Normal range of motion. She exhibits no edema, tenderness or deformity.  Lymphadenopathy:    She has no cervical adenopathy.  Neurological: She is oriented to person, place, and time.  Skin: Skin is warm and dry. No rash noted. She is not diaphoretic. No erythema. No pallor.  Psychiatric: She has a normal mood and affect. Her behavior is normal. Judgment and thought content normal.  Vitals reviewed.   Lab Results  Component Value Date   WBC 9.6 06/04/2016   HGB 13.1 06/04/2016  HCT 38.7 06/04/2016   PLT 314.0 06/04/2016   GLUCOSE 93 06/04/2016   CHOL 208 (H) 06/04/2016   TRIG 159.0 (H) 06/04/2016   HDL 47.50 06/04/2016   LDLDIRECT 150.8 11/30/2013   LDLCALC 129 (H) 06/04/2016   ALT 11 06/04/2016   AST 16 06/04/2016   NA 141 06/04/2016   K 4.0 06/04/2016   CL 106 06/04/2016   CREATININE 0.82 06/04/2016   BUN 16 06/04/2016   CO2 27 06/04/2016   TSH 1.30 06/04/2016    Dg  Lumbar Spine Complete  Result Date: 05/23/2016 CLINICAL DATA:  Pain. EXAM: LUMBAR SPINE - COMPLETE 4+ VIEW COMPARISON:  CT 05/17/2014 . FINDINGS: Soft tissue structures are unremarkable. Large amount stool noted throughout the colon. No acute bony abnormality identified. Aorto iliac atherosclerotic vascular disease. IMPRESSION: Diffuse multilevel degenerative change.  No acute bony abnormality. 2.  Aortoiliac atherosclerotic vascular disease. 3. Large amount stool noted throughout the colon suggesting constipation. Electronically Signed   By: Marcello Moores  Register   On: 05/23/2016 10:59  Dg Femur, Min 2 Views Right  Result Date: 05/23/2016 CLINICAL DATA:  Pain.  No recent injury. EXAM: RIGHT FEMUR 2 VIEWS COMPARISON:  No recent prior FINDINGS: There is no evidence of fracture or other focal bone lesions. Soft tissues are unremarkable. IMPRESSION: No acute abnormality. Electronically Signed   By: Marcello Moores  Register   On: 05/23/2016 11:00   Assessment & Plan:   Rochele was seen today for annual exam.  Diagnoses and all orders for this visit:  Routine general medical examination at a health care facility- exam completed, labs ordered and reviewed, vaccines reviewed, her Framingham risk score is only 2% so I do not recommend that she start a statin, Pap smear is up-to-date, she was referred for mammogram, colonoscopy is up-to-date, patient education material was given. -     Lipid panel; Future -     Comprehensive metabolic panel; Future -     CBC with Differential/Platelet; Future -     TSH; Future  Visit for screening mammogram -     MM DIGITAL SCREENING BILATERAL; Future   I am having Ms. Tunnell maintain her meloxicam and HYDROcodone-acetaminophen.  No orders of the defined types were placed in this encounter.    Follow-up: Return if symptoms worsen or fail to improve.  Scarlette Calico, MD

## 2016-07-02 ENCOUNTER — Ambulatory Visit
Admission: RE | Admit: 2016-07-02 | Discharge: 2016-07-02 | Disposition: A | Discharge status: Home / Self Care | Payer: BLUE CROSS/BLUE SHIELD | Source: Ambulatory Visit | Attending: Internal Medicine | Admitting: Internal Medicine

## 2016-07-02 DIAGNOSIS — Z1231 Encounter for screening mammogram for malignant neoplasm of breast: Secondary | ICD-10-CM

## 2016-07-03 LAB — HM MAMMOGRAPHY

## 2016-09-18 ENCOUNTER — Encounter: Payer: Self-pay | Admitting: Nurse Practitioner

## 2016-09-18 ENCOUNTER — Ambulatory Visit (INDEPENDENT_AMBULATORY_CARE_PROVIDER_SITE_OTHER): Payer: BLUE CROSS/BLUE SHIELD | Admitting: Nurse Practitioner

## 2016-09-18 VITALS — BP 126/82 | Temp 97.6°F | Ht 61.0 in | Wt 206.0 lb

## 2016-09-18 DIAGNOSIS — J0100 Acute maxillary sinusitis, unspecified: Secondary | ICD-10-CM

## 2016-09-18 DIAGNOSIS — J9801 Acute bronchospasm: Secondary | ICD-10-CM

## 2016-09-18 MED ORDER — METHYLPREDNISOLONE ACETATE 40 MG/ML IJ SUSP
40.0000 mg | Freq: Once | INTRAMUSCULAR | Status: AC
  Administered 2016-09-18: 40 mg via INTRAMUSCULAR

## 2016-09-18 MED ORDER — GUAIFENESIN ER 600 MG PO TB12: 600.0000 mg | ORAL_TABLET | Freq: Two times a day (BID) | ORAL | 0 refills | Status: DC | PRN

## 2016-09-18 MED ORDER — BENZONATATE 100 MG PO CAPS: 100.0000 mg | ORAL_CAPSULE | Freq: Three times a day (TID) | ORAL | 0 refills | Status: DC | PRN

## 2016-09-18 MED ORDER — OXYMETAZOLINE HCL 0.05 % NA SOLN: 1.0000 | Freq: Two times a day (BID) | NASAL | 0 refills | Status: DC

## 2016-09-18 MED ORDER — FLUTICASONE PROPIONATE 50 MCG/ACT NA SUSP: 2.0000 | Freq: Every day | NASAL | 0 refills | Status: DC

## 2016-09-18 MED ORDER — AMOXICILLIN-POT CLAVULANATE 875-125 MG PO TABS: 1.0000 | ORAL_TABLET | Freq: Two times a day (BID) | ORAL | 0 refills | Status: DC

## 2016-09-18 NOTE — Progress Notes (Signed)
Subjective:  Patient ID: Marie Torres, female    DOB: 1961/05/22  Age: 55 y.o. MRN: [id number]  CC: Sore Throat (pt stated having sore throat, fever, nose, coughing for 3 weeks.)   Cough  This is a new problem. The current episode started 1 to 4 weeks ago. The problem has been gradually worsening. The cough is productive of brown sputum and productive of sputum. Associated symptoms include chills, ear congestion, headaches, nasal congestion, postnasal drip, rhinorrhea and a sore throat. Pertinent negatives include no wheezing. Associated symptoms comments: Nasal congestion. The symptoms are aggravated by lying down. She has tried OTC cough suppressant for the symptoms. The treatment provided no relief.    Outpatient Medications Prior to Visit  Medication Sig Dispense Refill  . HYDROcodone-acetaminophen (NORCO/VICODIN) 5-325 MG tablet Take 1 tablet by mouth every 6 (six) hours as needed for moderate pain. (Patient not taking: Reported on 09/18/2016) 65 tablet 0  . meloxicam (MOBIC) 15 MG tablet Take 1 tablet (15 mg total) by mouth daily. (Patient not taking: Reported on 09/18/2016) 90 tablet 1   No facility-administered medications prior to visit.     ROS See HPI  Objective:  BP 126/82 (BP Location: Left Arm, Patient Position: Sitting, Cuff Size: Large)   Temp 97.6 F (36.4 C)   Ht 5\' 1"  (1.549 m)   Wt 206 lb (93.4 kg)   BMI 38.92 kg/m   BP Readings from Last 3 Encounters:  09/18/16 126/82  06/04/16 118/74  05/23/16 130/76    Wt Readings from Last 3 Encounters:  09/18/16 206 lb (93.4 kg)  06/04/16 207 lb 8 oz (94.1 kg)  05/23/16 203 lb (92.1 kg)    Physical Exam  Constitutional: No distress.  HENT:  Nose: Mucosal edema and rhinorrhea present. Right sinus exhibits maxillary sinus tenderness. Right sinus exhibits no frontal sinus tenderness. Left sinus exhibits maxillary sinus tenderness. Left sinus exhibits no frontal sinus tenderness.  Mouth/Throat: Uvula is midline.  Posterior oropharyngeal erythema present. No oropharyngeal exudate.  Eyes: Conjunctivae and EOM are normal. Pupils are equal, round, and reactive to light. No scleral icterus.  Neck: Normal range of motion. Neck supple.  Lymphadenopathy:    She has cervical adenopathy.  Vitals reviewed.   Lab Results  Component Value Date   WBC 9.6 06/04/2016   HGB 13.1 06/04/2016   HCT 38.7 06/04/2016   PLT 314.0 06/04/2016   GLUCOSE 93 06/04/2016   CHOL 208 (H) 06/04/2016   TRIG 159.0 (H) 06/04/2016   HDL 47.50 06/04/2016   LDLDIRECT 150.8 11/30/2013   LDLCALC 129 (H) 06/04/2016   ALT 11 06/04/2016   AST 16 06/04/2016   NA 141 06/04/2016   K 4.0 06/04/2016   CL 106 06/04/2016   CREATININE 0.82 06/04/2016   BUN 16 06/04/2016   CO2 27 06/04/2016   TSH 1.30 06/04/2016    Mm Digital Screening Bilateral  Result Date: 07/03/2016 CLINICAL DATA:  Screening. EXAM: DIGITAL SCREENING BILATERAL MAMMOGRAM WITH CAD COMPARISON:  Previous exam(s). ACR Breast Density Category b: There are scattered areas of fibroglandular density. FINDINGS: There are no findings suspicious for malignancy. Images were processed with CAD. IMPRESSION: No mammographic evidence of malignancy. A result letter of this screening mammogram will be mailed directly to the patient. RECOMMENDATION: Screening mammogram in one year. (Code:SM-B-01Y) BI-RADS CATEGORY  1: Negative. Electronically Signed   By: Fidela Salisbury M.D.   On: 07/03/2016 08:09    Assessment & Plan:   Aidan was seen today for sore throat.  Diagnoses and all orders for this visit:  Acute non-recurrent maxillary sinusitis -     methylPREDNISolone acetate (DEPO-MEDROL) injection 40 mg; Inject 1 mL (40 mg total) into the muscle once. -     fluticasone (FLONASE) 50 MCG/ACT nasal spray; Place 2 sprays into both nostrils daily. -     oxymetazoline (AFRIN NASAL SPRAY) 0.05 % nasal spray; Place 1 spray into both nostrils 2 (two) times daily. Use only for 3days, then  stop -     guaiFENesin (MUCINEX) 600 MG 12 hr tablet; Take 1 tablet (600 mg total) by mouth 2 (two) times daily as needed for cough or to loosen phlegm. -     amoxicillin-clavulanate (AUGMENTIN) 875-125 MG tablet; Take 1 tablet by mouth 2 (two) times daily.  Cough due to bronchospasm -     methylPREDNISolone acetate (DEPO-MEDROL) injection 40 mg; Inject 1 mL (40 mg total) into the muscle once. -     fluticasone (FLONASE) 50 MCG/ACT nasal spray; Place 2 sprays into both nostrils daily. -     oxymetazoline (AFRIN NASAL SPRAY) 0.05 % nasal spray; Place 1 spray into both nostrils 2 (two) times daily. Use only for 3days, then stop -     guaiFENesin (MUCINEX) 600 MG 12 hr tablet; Take 1 tablet (600 mg total) by mouth 2 (two) times daily as needed for cough or to loosen phlegm. -     amoxicillin-clavulanate (AUGMENTIN) 875-125 MG tablet; Take 1 tablet by mouth 2 (two) times daily. -     benzonatate (TESSALON) 100 MG capsule; Take 1 capsule (100 mg total) by mouth 3 (three) times daily as needed for cough.   I am having Marie Torres start on fluticasone, oxymetazoline, guaiFENesin, amoxicillin-clavulanate, and benzonatate. I am also having her maintain her meloxicam and HYDROcodone-acetaminophen. We administered methylPREDNISolone acetate.  Meds ordered this encounter  Medications  . methylPREDNISolone acetate (DEPO-MEDROL) injection 40 mg  . fluticasone (FLONASE) 50 MCG/ACT nasal spray    Sig: Place 2 sprays into both nostrils daily.    Dispense:  16 g    Refill:  0    Order Specific Question:   Supervising Provider    Answer:   Cassandria Anger [[id number]]  . oxymetazoline (AFRIN NASAL SPRAY) 0.05 % nasal spray    Sig: Place 1 spray into both nostrils 2 (two) times daily. Use only for 3days, then stop    Dispense:  30 mL    Refill:  0    Order Specific Question:   Supervising Provider    Answer:   Cassandria Anger [[id number]]  . guaiFENesin (MUCINEX) 600 MG 12 hr tablet    Sig: Take 1 tablet  (600 mg total) by mouth 2 (two) times daily as needed for cough or to loosen phlegm.    Dispense:  14 tablet    Refill:  0    Order Specific Question:   Supervising Provider    Answer:   Cassandria Anger [[id number]]  . amoxicillin-clavulanate (AUGMENTIN) 875-125 MG tablet    Sig: Take 1 tablet by mouth 2 (two) times daily.    Dispense:  20 tablet    Refill:  0    Order Specific Question:   Supervising Provider    Answer:   Cassandria Anger [[id number]]  . benzonatate (TESSALON) 100 MG capsule    Sig: Take 1 capsule (100 mg total) by mouth 3 (three) times daily as needed for cough.    Dispense:  20 capsule    Refill:  0    Order Specific Question:   Supervising Provider    Answer:   Cassandria Anger [[id number]]   Follow-up: No Follow-up on file.  Wilfred Lacy, NP

## 2016-09-18 NOTE — Progress Notes (Signed)
Pre visit review using our clinic review tool, if applicable. No additional management support is needed unless otherwise documented below in the visit note. 

## 2016-09-18 NOTE — Patient Instructions (Signed)
[facility] Instructions: Flonase and Afrin use: apply 1spray of afrin in each nare, wait 5mins, then apply 2sprays of flonase in each nare. Use both nasal spray consecutively x 3days, then flonase only for at least 14days.  Use over-the-counter  "cold" medicines  such as "Tylenol cold" , "Advil cold",  "Mucinex" or" Mucinex D"  for cough and congestion.  Avoid decongestants if you have high blood pressure. Use" Delsym" or" Robitussin" cough syrup varietis for cough.  You can use plain "Tylenol" or "Advi"l for fever, chills and achyness.   "Common cold" symptoms are usually triggered by a virus.  The antibiotics are usually not necessary. On average, a" viral cold" illness would take 4-7 days to resolve. Please, make an appointment if you are not better or if you're worse.  

## 2016-12-26 ENCOUNTER — Encounter: Payer: Self-pay | Admitting: Gastroenterology

## 2016-12-26 ENCOUNTER — Encounter (INDEPENDENT_AMBULATORY_CARE_PROVIDER_SITE_OTHER): Payer: Self-pay

## 2016-12-26 ENCOUNTER — Ambulatory Visit (INDEPENDENT_AMBULATORY_CARE_PROVIDER_SITE_OTHER): Payer: BLUE CROSS/BLUE SHIELD | Admitting: Gastroenterology

## 2016-12-26 VITALS — BP 120/62 | HR 84 | Ht 60.63 in | Wt 212.0 lb

## 2016-12-26 DIAGNOSIS — R152 Fecal urgency: Secondary | ICD-10-CM

## 2016-12-26 DIAGNOSIS — K449 Diaphragmatic hernia without obstruction or gangrene: Secondary | ICD-10-CM | POA: Diagnosis not present

## 2016-12-26 DIAGNOSIS — K2100 Gastro-esophageal reflux disease with esophagitis, without bleeding: Secondary | ICD-10-CM

## 2016-12-26 NOTE — Progress Notes (Signed)
[facility] Consult Note:  History: Marie Torres 12/26/2016  Referring physician: Scarlette Calico, MD  Reason for consult/chief complaint: Gastroesophageal Reflux (heartburn and indigestion after eating) and Gas (belching)   Subjective  HPI:  This is a 56 year old woman referred for recurrence of GERD symptoms. She was last seen by Dr. Sharlett Torres in February 2015 for rectal bleeding. Colonoscopy showed hemorrhoids, diverticulosis, and a hyperplastic polyp. She had been seen in 2011 and 12 for GERD symptoms with chest pain. An upper endoscopy showed a 5cm hiatal hernia, and a dilation was performed.  She reports that she was able to get off antacid medicine shortly after that and was good for years. Over the last couple of months she has had recurrent episodes of substernal burning and belching after some meals, such as yesterday when she ate a sausage and egg biscuit She denies dysphagia, early satiety, nausea, vomiting, and she has been gaining weight over the last year. Denies rectal bleeding.  She separately complains of years of a bothersome symptom where soon after a meal she will have the urgent need for bowel movement. It occurs sporadically with no clear food or other triggers.  ROS:  Review of Systems  Constitutional: Negative for appetite change and unexpected weight change.  HENT: Negative for mouth sores and voice change.   Eyes: Negative for pain and redness.  Respiratory: Negative for cough and shortness of breath.   Cardiovascular: Negative for chest pain and palpitations.  Genitourinary: Negative for dysuria and hematuria.  Musculoskeletal: Negative for arthralgias and myalgias.  Skin: Negative for pallor and rash.  Neurological: Negative for weakness and headaches.  Hematological: Negative for adenopathy.     Past Medical History: Past Medical History:  Diagnosis Date  . Arthritis    right hand  . Chicken pox    Childhood  . Diverticulosis of colon  (without mention of hemorrhage)   . GERD (gastroesophageal reflux disease)   . Hiatal hernia   . Hyperplastic colon polyp   . Mass of finger of right hand 02/2013   right middle finger  . Seasonal allergies   . Sinus congestion 03/16/2013   cough, stuffy and runny nose  . Urine incontinence   . Wears partial dentures    upper   She has no chronic medical problems  Past Surgical History: Past Surgical History:  Procedure Laterality Date  . COLONOSCOPY  08/14/2007   562.10, 211.3  . FOOT SURGERY Bilateral    exc. ingrown toenail great toe  . TUBAL LIGATION  09/02/2002     Family History: Family History  Problem Relation Age of Onset  . Kidney disease Father   . Heart disease Father   . Stroke Father   . Hypertension Father   . Heart disease Mother   . Depression Mother   . Cancer Sister     lung  . Colon cancer Neg Hx    Sr. passed away about 6 months ago from lung cancer Social History: Social History   Social History  . Marital status: Single    Spouse name: N/A  . Number of children: N/A  . Years of education: N/A   Social History Main Topics  . Smoking status: Former Research scientist (life sciences)  . Smokeless tobacco: Never Used     Comment: quit smoking 20 years ago  . Alcohol use No  . Drug use: No  . Sexual activity: Yes   Other Topics Concern  . None   Social History Narrative  . None  Allergies: Allergies  Allergen Reactions  . Shellfish Allergy Hives    Outpatient Meds: Current Outpatient Prescriptions  Medication Sig Dispense Refill  . albuterol (PROVENTIL HFA;VENTOLIN HFA) 108 (90 Base) MCG/ACT inhaler Inhale 2 puffs into the lungs as needed for wheezing or shortness of breath.    Marland Kitchen oxymetazoline (AFRIN NASAL SPRAY) 0.05 % nasal spray Place 1 spray into both nostrils 2 (two) times daily. Use only for 3days, then stop 30 mL 0   No current facility-administered medications for this visit.        ___________________________________________________________________ Objective   Exam:  BP 120/62 (BP Location: Left Arm, Patient Position: Sitting, Cuff Size: Large)   Pulse 84   Ht 5' 0.63" (1.54 m)   Wt 212 lb (96.2 kg)   BMI 40.55 kg/m    General: this is a(n) Obese middle-aged woman with normal vocal quality   Eyes: sclera anicteric, no redness  ENT: oral mucosa moist without lesions, no cervical or supraclavicular lymphadenopathy, good dentition  CV: RRR without murmur, S1/S2, no JVD, no peripheral edema  Resp: clear to auscultation bilaterally, normal RR and effort noted  GI: soft, no tenderness, with active bowel sounds. No guarding or palpable organomegaly noted.  Skin; warm and dry, no rash or jaundice noted  Neuro: awake, alert and oriented x 3. Normal gross motor function and fluent speech   Assessment: Encounter Diagnoses  Name Primary?  . ESOPHAGITIS, REFLUX Yes  . Hiatal hernia   . Fecal urgency     Recurrence of GERD symptoms, perhaps from elevated BMI, dietary indiscretions, possibly enlarging hiatal hernia.  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 I don't think antispasmodic therapy is likely to be of much help given the intermittent and unpredictable symptoms.  Plan:  Written and photo regarding GERD diet and lifestyle measures was given. Omeprazole OTC 20 mg once daily for 4 weeks.  Call us in 4 weeks and if symptoms are not much improved, most likely pursue upper endoscopy. As there are no red flag symptoms at present and she has a known hiatal hernia, if she improves with the above measures then an endoscopy would be of low yield.  Thank you for the courtesy of this consult.  Please call me with any questions or concerns.  Marie Torres III  CC: Marie Calico, MD

## 2016-12-26 NOTE — Patient Instructions (Addendum)
OTC omeprazole 20 mg once daily for 4 weeks.  See GERD diet/lifestyle recommendations below:  If you are not feeling better in 4 weeks, call us and we will most likely schedule an upper endoscopy.  If you are age 58 or older, your body mass index should be between 23-30. Your Body mass index is 40.55 kg/m. If this is out of the aforementioned range listed, please consider follow up with your Primary Care Provider.  If you are age 72 or younger, your body mass index should be between 19-25. Your Body mass index is 40.55 kg/m. If this is out of the aformentioned range listed, please consider follow up with your Primary Care Provider.    Food Choices for Gastroesophageal Reflux Disease, Adult When you have gastroesophageal reflux disease (GERD), the foods you eat and your eating habits are very important. Choosing the right foods can help ease your discomfort. What guidelines do I need to follow?  Choose fruits, vegetables, whole grains, and low-fat dairy products.  Choose low-fat meat, fish, and poultry.  Limit fats such as oils, salad dressings, butter, nuts, and avocado.  Keep a food diary. This helps you identify foods that cause symptoms.  Avoid foods that cause symptoms. These may be different for everyone.  Eat small meals often instead of 3 large meals a day.  Eat your meals slowly, in a place where you are relaxed.  Limit fried foods.  Cook foods using methods other than frying.  Avoid drinking alcohol.  Avoid drinking large amounts of liquids with your meals.  Avoid bending over or lying down until 2-3 hours after eating. What foods are not recommended? These are some foods and drinks that may make your symptoms worse: Vegetables  Tomatoes. Tomato juice. Tomato and spaghetti sauce. Chili peppers. Onion and garlic. Horseradish. Fruits  Oranges, grapefruit, and lemon (fruit and juice). Meats  High-fat meats, fish, and poultry. This includes hot dogs, ribs, ham,  sausage, salami, and bacon. Dairy  Whole milk and chocolate milk. Sour cream. Cream. Butter. Ice cream. Cream cheese. Drinks  Coffee and tea. Bubbly (carbonated) drinks or energy drinks. Condiments  Hot sauce. Barbecue sauce. Sweets/Desserts  Chocolate and cocoa. Donuts. Peppermint and spearmint. Fats and Oils  High-fat foods. This includes [place] fries and potato chips. Other  Vinegar. Strong spices. This includes black pepper, white pepper, red pepper, cayenne, curry powder, cloves, ginger, and chili powder. The items listed above may not be a complete list of foods and drinks to avoid. Contact your dietitian for more information.  This information is not intended to replace advice given to you by your health care provider. Make sure you discuss any questions you have with your health care provider. Document Released: 04/14/2012 Document Revised: 03/21/2016 Document Reviewed: 08/18/2013 Elsevier Interactive Patient Education  2017 [employer].  Thank you for choosing [facility]  Dr Wilfrid Lund III

## 2017-01-26 ENCOUNTER — Emergency Department (HOSPITAL_COMMUNITY)
Admission: EM | Admit: 2017-01-26 | Discharge: 2017-01-26 | Disposition: A | Discharge status: Home / Self Care | Payer: BLUE CROSS/BLUE SHIELD | Attending: Emergency Medicine | Admitting: Emergency Medicine

## 2017-01-26 ENCOUNTER — Encounter (HOSPITAL_COMMUNITY): Payer: Self-pay | Admitting: Emergency Medicine

## 2017-01-26 DIAGNOSIS — N3001 Acute cystitis with hematuria: Secondary | ICD-10-CM | POA: Insufficient documentation

## 2017-01-26 DIAGNOSIS — R300 Dysuria: Secondary | ICD-10-CM | POA: Diagnosis present

## 2017-01-26 DIAGNOSIS — Z87891 Personal history of nicotine dependence: Secondary | ICD-10-CM | POA: Insufficient documentation

## 2017-01-26 DIAGNOSIS — Z79899 Other long term (current) drug therapy: Secondary | ICD-10-CM | POA: Insufficient documentation

## 2017-01-26 LAB — URINALYSIS, ROUTINE W REFLEX MICROSCOPIC
Bilirubin Urine: NEGATIVE
GLUCOSE, UA: NEGATIVE mg/dL
KETONES UR: NEGATIVE mg/dL
NITRITE: NEGATIVE
Protein, ur: 100 mg/dL — AB
Specific Gravity, Urine: 1.012 (ref 1.005–1.030)
pH: 5 (ref 5.0–8.0)

## 2017-01-26 MED ORDER — PHENAZOPYRIDINE HCL 100 MG PO TABS
100.0000 mg | ORAL_TABLET | Freq: Once | ORAL | Status: AC
  Administered 2017-01-26: 100 mg via ORAL
  Filled 2017-01-26: qty 1

## 2017-01-26 MED ORDER — CEPHALEXIN 500 MG PO CAPS: 500.0000 mg | ORAL_CAPSULE | Freq: Three times a day (TID) | ORAL | 0 refills | Status: DC

## 2017-01-26 MED ORDER — PHENAZOPYRIDINE HCL 200 MG PO TABS: 200.0000 mg | ORAL_TABLET | Freq: Three times a day (TID) | ORAL | 0 refills | Status: DC

## 2017-01-26 MED ORDER — ACETAMINOPHEN 500 MG PO TABS: 500.0000 mg | ORAL_TABLET | Freq: Four times a day (QID) | ORAL | 0 refills | Status: DC | PRN

## 2017-01-26 NOTE — ED Triage Notes (Signed)
Pt reports pain and burning with urination along with blood when wiping. Pt states symptoms started this morning.

## 2017-01-26 NOTE — ED Provider Notes (Signed)
[facility] DEPT Provider Note   CSN: [id number] Arrival date & time: 01/26/17  0620     History   Chief Complaint Chief Complaint  Patient presents with  . Dysuria    HPI Marie Torres is a 56 y.o. female.  Marie Torres is a 56 y.o. Female who presents to the ED complaining of dysuria, urinary frequency, and urinary urgency since this morning. Patient reports she woke up this morning and had pain with urinating and some blood on her toilet paper when wiping. Since then she has had urinary frequency and urgency. She denies any abdominal pain, nausea or vomiting. She denies fevers, vaginal bleeding, rectal pain, flank pain, or difficulty urinating. No recent urinary tract infections.   The history is provided by the patient and medical records. No language interpreter was used.  Dysuria   Associated symptoms include frequency, hematuria and urgency. Pertinent negatives include no chills, no nausea, no vomiting and no flank pain.    Past Medical History:  Diagnosis Date  . Arthritis    right hand  . Chicken pox    Childhood  . Diverticulosis of colon (without mention of hemorrhage)   . GERD (gastroesophageal reflux disease)   . Hiatal hernia   . Hyperplastic colon polyp   . Mass of finger of right hand 02/2013   right middle finger  . Seasonal allergies   . Sinus congestion 03/16/2013   cough, stuffy and runny nose  . Urine incontinence   . Wears partial dentures    upper    Patient Active Problem List   Diagnosis Date Noted  . Routine general medical examination at a health care facility 06/04/2016  . Visit for screening mammogram 06/04/2016  . Right-sided low back pain with right-sided sciatica 05/23/2016  . Hip pain, acute 05/23/2016  . Hot flashes not due to menopause 08/17/2015  . Insomnia 08/17/2015  . Rectal incontinence 08/17/2015  . Pain due to interstitial cystitis 09/16/2014  . ESOPHAGITIS, REFLUX 08/14/2007    Past Surgical History:  Procedure  Laterality Date  . COLONOSCOPY  08/14/2007   562.10, 211.3  . FOOT SURGERY Bilateral    exc. ingrown toenail great toe  . TUBAL LIGATION  09/02/2002    OB History    Gravida Para Term Preterm AB Living   1 1 1     1    SAB TAB Ectopic Multiple Live Births                   Home Medications    Prior to Admission medications   Medication Sig Start Date End Date Taking? Authorizing Provider  acetaminophen (TYLENOL) 500 MG tablet Take 1 tablet (500 mg total) by mouth every 6 (six) hours as needed for mild pain or moderate pain. 01/26/17   Waynetta Pean, PA-C  cephALEXin (KEFLEX) 500 MG capsule Take 1 capsule (500 mg total) by mouth 3 (three) times daily. 01/26/17   Waynetta Pean, PA-C  phenazopyridine (PYRIDIUM) 200 MG tablet Take 1 tablet (200 mg total) by mouth 3 (three) times daily. 01/26/17   Waynetta Pean, PA-C    Family History Family History  Problem Relation Age of Onset  . Kidney disease Father   . Heart disease Father   . Stroke Father   . Hypertension Father   . Heart disease Mother   . Depression Mother   . Cancer Sister     lung  . Colon cancer Neg Hx     Social History Social History  Substance  Use Topics  . Smoking status: Former Research scientist (life sciences)  . Smokeless tobacco: Never Used     Comment: quit smoking 20 years ago  . Alcohol use No     Allergies   Shellfish allergy   Review of Systems Review of Systems  Constitutional: Negative for chills and fever.  HENT: Negative for sore throat.   Eyes: Negative for visual disturbance.  Respiratory: Negative for cough and shortness of breath.   Cardiovascular: Negative for chest pain.  Gastrointestinal: Negative for abdominal pain, blood in stool, nausea and vomiting.  Genitourinary: Positive for dysuria, frequency, hematuria and urgency. Negative for decreased urine volume, difficulty urinating, flank pain, menstrual problem, pelvic pain, vaginal bleeding and vaginal discharge.  Musculoskeletal: Negative for back  pain.  Skin: Negative for rash.  Neurological: Negative for headaches.     Physical Exam Updated Vital Signs BP (!) 134/91 (BP Location: Left Arm)   Pulse 81   Temp 97.8 F (36.6 C) (Oral)   Resp 17   Ht 5\' 1"  (1.549 m)   Wt 93 kg   SpO2 95%   BMI 38.73 kg/m   Physical Exam  Constitutional: She appears well-developed and well-nourished. No distress.  Nontoxic appearing.  HENT:  Head: Normocephalic and atraumatic.  Eyes: Conjunctivae are normal. Pupils are equal, round, and reactive to light. Right eye exhibits no discharge. Left eye exhibits no discharge.  Neck: Neck supple.  Cardiovascular: Normal rate, regular rhythm, normal heart sounds and intact distal pulses.   Pulmonary/Chest: Effort normal and breath sounds normal. No respiratory distress.  Abdominal: Soft. Bowel sounds are normal. She exhibits no distension and no mass. There is no tenderness. There is no guarding.  Abdomen is soft and nontender to palpation. No CVA or flank tenderness.  Musculoskeletal: She exhibits no edema.  Lymphadenopathy:    She has no cervical adenopathy.  Neurological: She is alert. Coordination normal.  Skin: Skin is warm and dry. Capillary refill takes less than 2 seconds. No rash noted. She is not diaphoretic. No erythema. No pallor.  Psychiatric: She has a normal mood and affect. Her behavior is normal.  Nursing note and vitals reviewed.    ED Treatments / Results  Labs (all labs ordered are listed, but only abnormal results are displayed) Labs Reviewed  URINALYSIS, ROUTINE W REFLEX MICROSCOPIC - Abnormal; Notable for the following:       Result Value   APPearance CLOUDY (*)    Hgb urine dipstick LARGE (*)    Protein, ur 100 (*)    Leukocytes, UA LARGE (*)    Bacteria, UA FEW (*)    Squamous Epithelial / LPF 0-5 (*)    All other components within normal limits  URINE CULTURE    EKG  EKG Interpretation None       Radiology No results found.  Procedures Procedures  (including critical care time)  Medications Ordered in ED Medications  phenazopyridine (PYRIDIUM) tablet 100 mg (100 mg Oral Given 01/26/17 5427)     Initial Impression / Assessment and Plan / ED Course  I have reviewed the triage vital signs and the nursing notes.  Pertinent labs & imaging results that were available during my care of the patient were reviewed by me and considered in my medical decision making (see chart for details).    This is a 56 y.o. Female who presents to the ED complaining of dysuria, urinary frequency, and urinary urgency since this morning. Patient reports she woke up this morning and had pain with  urinating and some blood on her toilet paper when wiping. Since then she has had urinary frequency and urgency. No fevers, abdominal pain or flank pain. On exam the patient is afebrile nontoxic appearing. Her abdomen is soft and nontender to palpation. No CVA or flank tenderness. Urinalysis shows large hemoglobin, large leukocytes and too numerous to count white blood cells. Urine sent for culture. Will treat for urinary tract infection with Keflex. I also provided her with a prescription for peridium. I discussed strict and specific return precautions. I advised the patient to follow-up with their primary care provider this week. I advised the patient to return to the emergency department with new or worsening symptoms or new concerns. The patient verbalized understanding and agreement with plan.     Final Clinical Impressions(s) / ED Diagnoses   Final diagnoses:  Acute cystitis with hematuria    New Prescriptions New Prescriptions   ACETAMINOPHEN (TYLENOL) 500 MG TABLET    Take 1 tablet (500 mg total) by mouth every 6 (six) hours as needed for mild pain or moderate pain.   CEPHALEXIN (KEFLEX) 500 MG CAPSULE    Take 1 capsule (500 mg total) by mouth 3 (three) times daily.   PHENAZOPYRIDINE (PYRIDIUM) 200 MG TABLET    Take 1 tablet (200 mg total) by mouth 3 (three)  times daily.     Waynetta Pean, PA-C 01/26/17 6578    Everlene Balls, MD 01/26/17 1416

## 2017-01-29 LAB — URINE CULTURE: Culture: >=100000 — AB

## 2017-01-30 ENCOUNTER — Telehealth: Payer: Self-pay | Admitting: Emergency Medicine

## 2017-01-30 NOTE — Telephone Encounter (Signed)
Post ED Visit - Positive Culture Follow-up  Culture report reviewed by antimicrobial stewardship pharmacist:  []  Elenor Quinones, Pharm.D. []  Heide Guile, Pharm.D., BCPS AQ-ID []  Parks Neptune, Pharm.D., BCPS []  Alycia Rossetti, Pharm.D., BCPS []  [place], Pharm.D., BCPS, AAHIVP []  Legrand Como, Pharm.D., BCPS, AAHIVP []  Salome Arnt, PharmD, BCPS [x]  Dimitri Ped, PharmD, BCPS []  Vincenza Hews, PharmD, BCPS  Positive urine culture Treated with cephalexin, organism sensitive to the same and no further patient follow-up is required at this time.  [name] 01/30/2017, 12:43 PM

## 2017-02-03 NOTE — Telephone Encounter (Signed)
Post ED Visit - Positive Culture Follow-up  Culture report reviewed by antimicrobial stewardship pharmacist:  []  Elenor Quinones, Pharm.D. []  Heide Guile, Pharm.D., BCPS AQ-ID []  Parks Neptune, Pharm.D., BCPS []  Alycia Rossetti, Pharm.D., BCPS []  [place], Pharm.D., BCPS, AAHIVP []  Legrand Como, Pharm.D., BCPS, AAHIVP []  Salome Arnt, PharmD, BCPS [x]  Dimitri Ped, PharmD, BCPS []  Vincenza Hews, PharmD, BCPS  Positive urine culture Treated with cephalexin, organism sensitive to the same and no further patient follow-up is required at this time.  [name] 02/03/2017, 8:58 AM

## 2017-02-24 ENCOUNTER — Encounter: Payer: Self-pay | Admitting: Gynecology

## 2017-02-24 ENCOUNTER — Ambulatory Visit (INDEPENDENT_AMBULATORY_CARE_PROVIDER_SITE_OTHER): Payer: BLUE CROSS/BLUE SHIELD | Admitting: Gynecology

## 2017-02-24 VITALS — BP 128/82 | Ht 61.75 in | Wt 212.6 lb

## 2017-02-24 DIAGNOSIS — Z01419 Encounter for gynecological examination (general) (routine) without abnormal findings: Secondary | ICD-10-CM

## 2017-02-24 DIAGNOSIS — M6289 Other specified disorders of muscle: Secondary | ICD-10-CM | POA: Diagnosis not present

## 2017-02-24 DIAGNOSIS — Z780 Asymptomatic menopausal state: Secondary | ICD-10-CM

## 2017-02-24 LAB — HM PAP SMEAR

## 2017-02-24 NOTE — Progress Notes (Signed)
Marie Torres August 27, 1961 [id number]   History:    56 y.o.  for annual gyn exam complaining of tiredness and fatigue. Patient has history of interstitial cystitis is no longer taking Elmeron and is asymptomatic. Patient stated that she had a normal colonoscopy in 2015 but several years prior she had benign colon polyps and thus she is on a 5 year recall. 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 Pap smear last year was normal. Patient recently menopause at the age of 50 has never been on hormone replacement therapy she reports very vague vasomotor symptoms. Patient is on no hormone replacement therapy. Patient had a normal bone density study in 2016.  Past medical history,surgical history, family history and social history were all reviewed and documented in the EPIC chart.  Gynecologic History No LMP recorded. Patient is postmenopausal. Contraception: post menopausal status Last Pap: . 2015 Results were: normal Last mammogram: 2017. Results were: normal  Obstetric History OB History  Gravida Para Term Preterm AB Living  1 1 1     1   SAB TAB Ectopic Multiple Live Births               # Outcome Date GA Lbr Len/2nd Weight Sex Delivery Anes PTL Lv  1 Term      Vag-Spont          ROS: A ROS was performed and pertinent positives and negatives are included in the history.  GENERAL: No fevers or chills. HEENT: No change in vision, no earache, sore throat or sinus congestion. NECK: No pain or stiffness. CARDIOVASCULAR: No chest pain or pressure. No palpitations. PULMONARY: No shortness of breath, cough or wheeze. GASTROINTESTINAL: No abdominal pain, nausea, vomiting or diarrhea, melena or bright red blood per rectum. GENITOURINARY: No urinary frequency, urgency, hesitancy or dysuria. MUSCULOSKELETAL: No joint or muscle pain, no back pain, no recent trauma. DERMATOLOGIC: No rash, no itching, no lesions. ENDOCRINE: No  polyuria, polydipsia, no heat or cold intolerance. No recent change in weight. HEMATOLOGICAL: No anemia or easy bruising or bleeding. NEUROLOGIC: No headache, seizures, numbness, tingling or weakness. PSYCHIATRIC: No depression, no loss of interest in normal activity or change in sleep pattern.     Exam: chaperone present  BP 128/82   Ht 5' 1.75" (1.568 m)   Wt 212 lb 9.6 oz (96.4 kg)   BMI 39.20 kg/m   Body mass index is 39.2 kg/m.  General appearance : Well developed well nourished female. No acute distress HEENT: Eyes: no retinal hemorrhage or exudates,  Neck supple, trachea midline, no carotid bruits, no thyroidmegaly Lungs: Clear to auscultation, no rhonchi or wheezes, or rib retractions  Heart: Regular rate and rhythm, no murmurs or gallops Breast:Examined in sitting and supine position were symmetrical in appearance, no palpable masses or tenderness,  no skin retraction, no nipple inversion, no nipple discharge, no skin discoloration, no axillary or supraclavicular lymphadenopathy Abdomen: no palpable masses or tenderness, no rebound or guarding Extremities: no edema or skin discoloration or tenderness  Pelvic:  Bartholin, Urethra, Skene Glands: Within normal limits             Vagina: No gross lesions or discharge  Cervix: No gross lesions or discharge  Uterus  anteverted, normal size, shape and consistency, non-tender and mobile  Adnexa  Without masses or tenderness  Anus and perineum  normal   Rectovaginal  normal sphincter tone without palpated masses or  tenderness             Hemoccult cards provided     Assessment/Plan:  56 y.o. female for annual exam scheduled to return to the office next week in a fasting state for her blood work as well as for her bone density study. The following blood work will be ordered: Comprehensive metabolic panel, fasting lipid profile, it is of her tiredness and fatigue will check a TSH and vitamin D level along with a CBC. Pap smear was  done today. Patient was reminded to do her mammogram in September of this year. We discussed importance of calcium vitamin D and weightbearing exercises for osteoporosis prevention. Next colonoscopy is due in 2020.   Terrance Mass MD, 11:38 AM 02/24/2017

## 2017-02-24 NOTE — Patient Instructions (Signed)
Bone Densitometry Bone densitometry is an imaging test that uses a special X-ray to measure the amount of calcium and other minerals in your bones (bone density). This test is also known as a bone mineral density test or dual-energy X-ray absorptiometry (DXA). The test can measure bone density at your hip and your spine. It is similar to having a regular X-ray. You may have this test to:  Diagnose a condition that causes weak or thin bones (osteoporosis).  Predict your risk of a broken bone (fracture).  Determine how well osteoporosis treatment is working. Tell a health care provider about:  Any allergies you have.  All medicines you are taking, including vitamins, herbs, eye drops, creams, and over-the-counter medicines.  Any problems you or family members have had with anesthetic medicines.  Any blood disorders you have.  Any surgeries you have had.  Any medical conditions you have.  Possibility of pregnancy.  Any other medical test you had within the previous 14 days that used contrast material. What are the risks? Generally, this is a safe procedure. However, problems can occur and may include the following:  This test exposes you to a very small amount of radiation.  The risks of radiation exposure may be greater to unborn children. What happens before the procedure?  Do not take any calcium supplements for 24 hours before having the test. You can otherwise eat and drink what you usually do.  Take off all metal jewelry, eyeglasses, dental appliances, and any other metal objects. What happens during the procedure?  You may lie on an exam table. There will be an X-ray generator below you and an imaging device above you.  Other devices, such as boxes or braces, may be used to position your body properly for the scan.  You will need to lie still while the machine slowly scans your body.  The images will show up on a computer monitor. What happens after the  procedure? You may need more testing at a later time. This information is not intended to replace advice given to you by your health care provider. Make sure you discuss any questions you have with your health care provider. Document Released: 11/05/2004 Document Revised: 03/21/2016 Document Reviewed: 03/24/2014 Elsevier Interactive Patient Education  2017 [employer]  

## 2017-02-25 LAB — PAP IG W/ RFLX HPV ASCU

## 2017-02-27 ENCOUNTER — Other Ambulatory Visit: Payer: BLUE CROSS/BLUE SHIELD

## 2017-02-27 ENCOUNTER — Ambulatory Visit: Payer: BLUE CROSS/BLUE SHIELD

## 2017-02-27 LAB — CBC WITH DIFFERENTIAL/PLATELET
Basophils Absolute: 0 cells/uL (ref 0–200)
Basophils Relative: 0 %
EOS ABS: 160 {cells}/uL (ref 15–500)
Eosinophils Relative: 2 %
HEMATOCRIT: 41.2 % (ref 35.0–45.0)
Hemoglobin: 13.3 g/dL (ref 11.7–15.5)
LYMPHS PCT: 32 %
Lymphs Abs: 2560 cells/uL (ref 850–3900)
MCH: 28.2 pg (ref 27.0–33.0)
MCHC: 32.3 g/dL (ref 32.0–36.0)
MCV: 87.3 fL (ref 80.0–100.0)
MONO ABS: 480 {cells}/uL (ref 200–950)
MPV: 10.1 fL (ref 7.5–12.5)
Monocytes Relative: 6 %
NEUTROS PCT: 60 %
Neutro Abs: 4800 cells/uL (ref 1500–7800)
Platelets: 321 10*3/uL (ref 140–400)
RBC: 4.72 MIL/uL (ref 3.80–5.10)
RDW: 12.9 % (ref 11.0–15.0)
WBC: 8 10*3/uL (ref 3.8–10.8)

## 2017-02-27 LAB — COMPREHENSIVE METABOLIC PANEL
ALT: 14 U/L (ref 6–29)
AST: 18 U/L (ref 10–35)
Albumin: 3.8 g/dL (ref 3.6–5.1)
Alkaline Phosphatase: 62 U/L (ref 33–130)
BUN: 17 mg/dL (ref 7–25)
CHLORIDE: 106 mmol/L (ref 98–110)
CO2: 23 mmol/L (ref 20–31)
Calcium: 9.4 mg/dL (ref 8.6–10.4)
Creat: 0.84 mg/dL (ref 0.50–1.05)
Glucose, Bld: 104 mg/dL — ABNORMAL HIGH (ref 65–99)
POTASSIUM: 4.7 mmol/L (ref 3.5–5.3)
Sodium: 139 mmol/L (ref 135–146)
Total Bilirubin: 0.5 mg/dL (ref 0.2–1.2)
Total Protein: 7.5 g/dL (ref 6.1–8.1)

## 2017-02-27 LAB — LIPID PANEL
CHOL/HDL RATIO: 4.2 ratio (ref <5.0)
Cholesterol: 228 mg/dL — ABNORMAL HIGH (ref <200)
HDL: 54 mg/dL (ref >50)
LDL CALC: 150 mg/dL — AB (ref <100)
Triglycerides: 120 mg/dL (ref <150)
VLDL: 24 mg/dL (ref <30)

## 2017-02-27 LAB — TSH: TSH: 1.11 m[IU]/L

## 2017-02-28 ENCOUNTER — Encounter: Payer: Self-pay | Admitting: Gynecology

## 2017-02-28 ENCOUNTER — Other Ambulatory Visit: Payer: Self-pay | Admitting: Gynecology

## 2017-02-28 DIAGNOSIS — E559 Vitamin D deficiency, unspecified: Secondary | ICD-10-CM

## 2017-02-28 LAB — VITAMIN D 25 HYDROXY (VIT D DEFICIENCY, FRACTURES): Vit D, 25-Hydroxy: 10 ng/mL — ABNORMAL LOW (ref 30–100)

## 2017-02-28 MED ORDER — VITAMIN D (ERGOCALCIFEROL) 1.25 MG (50000 UNIT) PO CAPS: 50000.0000 [IU] | ORAL_CAPSULE | ORAL | 0 refills | Status: DC

## 2017-03-12 ENCOUNTER — Encounter: Payer: Self-pay | Admitting: Gynecology

## 2017-04-01 ENCOUNTER — Other Ambulatory Visit: Payer: Self-pay | Admitting: Gynecology

## 2017-04-01 ENCOUNTER — Ambulatory Visit (INDEPENDENT_AMBULATORY_CARE_PROVIDER_SITE_OTHER): Payer: BLUE CROSS/BLUE SHIELD

## 2017-04-01 DIAGNOSIS — Z780 Asymptomatic menopausal state: Secondary | ICD-10-CM

## 2017-04-01 DIAGNOSIS — Z13820 Encounter for screening for osteoporosis: Secondary | ICD-10-CM | POA: Diagnosis not present

## 2017-05-26 ENCOUNTER — Encounter (HOSPITAL_COMMUNITY): Payer: Self-pay

## 2017-05-26 ENCOUNTER — Emergency Department (HOSPITAL_COMMUNITY)
Admission: EM | Admit: 2017-05-26 | Discharge: 2017-05-27 | Disposition: A | Discharge status: Home / Self Care | Payer: BLUE CROSS/BLUE SHIELD | Attending: Emergency Medicine | Admitting: Emergency Medicine

## 2017-05-26 DIAGNOSIS — M7918 Myalgia, other site: Secondary | ICD-10-CM

## 2017-05-26 DIAGNOSIS — R2243 Localized swelling, mass and lump, lower limb, bilateral: Secondary | ICD-10-CM | POA: Diagnosis not present

## 2017-05-26 DIAGNOSIS — Z87891 Personal history of nicotine dependence: Secondary | ICD-10-CM | POA: Insufficient documentation

## 2017-05-26 DIAGNOSIS — M791 Myalgia: Secondary | ICD-10-CM | POA: Insufficient documentation

## 2017-05-26 DIAGNOSIS — M79605 Pain in left leg: Secondary | ICD-10-CM

## 2017-05-26 NOTE — ED Triage Notes (Signed)
Pt c/o L calf pain since this morning. She endorses swelling and warmth concerning for DVT. She denies any recent long trips or hormone use. A&Ox4. Ambulatory.

## 2017-05-27 ENCOUNTER — Ambulatory Visit (INDEPENDENT_AMBULATORY_CARE_PROVIDER_SITE_OTHER): Payer: BLUE CROSS/BLUE SHIELD | Admitting: Family

## 2017-05-27 ENCOUNTER — Encounter: Payer: Self-pay | Admitting: Family

## 2017-05-27 VITALS — BP 122/74 | HR 79 | Temp 98.4°F | Resp 16 | Ht 61.0 in | Wt 214.8 lb

## 2017-05-27 DIAGNOSIS — R600 Localized edema: Secondary | ICD-10-CM | POA: Diagnosis not present

## 2017-05-27 LAB — D-DIMER, QUANTITATIVE (NOT AT ARMC): D DIMER QUANT: 0.27 ug{FEU}/mL (ref 0.00–0.50)

## 2017-05-27 MED ORDER — IBUPROFEN 600 MG PO TABS: 600.0000 mg | ORAL_TABLET | Freq: Four times a day (QID) | ORAL | 0 refills | Status: DC | PRN

## 2017-05-27 MED ORDER — IBUPROFEN 200 MG PO TABS
600.0000 mg | ORAL_TABLET | Freq: Once | ORAL | Status: AC
  Administered 2017-05-27: 600 mg via ORAL
  Filled 2017-05-27: qty 3

## 2017-05-27 MED ORDER — HYDROCHLOROTHIAZIDE 25 MG PO TABS: 12.5000 mg | ORAL_TABLET | Freq: Every day | ORAL | 0 refills | Status: DC | PRN

## 2017-05-27 NOTE — Patient Instructions (Signed)
Thank you for choosing [facility].  SUMMARY AND INSTRUCTIONS:  Recommend decreasing salt/sodium intake as well as caffeine and soft drinks.   Increase water intake with goal of 64 oz per day.  Elevate feet when seated.   Compression socks as needed.  Hydrochlorothizaide - 0.5 to 1 tablet daily as needed for swelling.  Recommend a sleep study to rule out sleep apnea.   Medication:  Your prescription(s) have been submitted to your pharmacy or been printed and provided for you. Please take as directed and contact our office if you believe you are having problem(s) with the medication(s) or have any questions.  Follow up:  If your symptoms worsen or fail to improve, please contact our office for further instruction, or in case of emergency go directly to the emergency room at the closest medical facility.   Edema Edema is when you have too much fluid in your body or under your skin. Edema may make your legs, feet, and ankles swell up. Swelling is also common in looser tissues, like around your eyes. This is a common condition. It gets more common as you get older. There are many possible causes of edema. Eating too much salt (sodium) and being on your feet or sitting for a long time can cause edema in your legs, feet, and ankles. Hot weather may make edema worse. Edema is usually painless. Your skin may look swollen or shiny. Follow these instructions at home:  Keep the swollen body part raised (elevated) above the level of your heart when you are sitting or lying down.  Do not sit still or stand for a long time.  Do not wear tight clothes. Do not wear garters on your upper legs.  Exercise your legs. This can help the swelling go down.  Wear elastic bandages or support stockings as told by your doctor.  Eat a low-salt (low-sodium) diet to reduce fluid as told by your doctor.  Depending on the cause of your swelling, you may need to limit how much fluid you drink (fluid  restriction).  Take over-the-counter and prescription medicines only as told by your doctor. Contact a doctor if:  Treatment is not working.  You have heart, liver, or kidney disease and have symptoms of edema.  You have sudden and unexplained weight gain. Get help right away if:  You have shortness of breath or chest pain.  You cannot breathe when you lie down.  You have pain, redness, or warmth in the swollen areas.  You have heart, liver, or kidney disease and get edema all of a sudden.  You have a fever and your symptoms get worse all of a sudden. Summary  Edema is when you have too much fluid in your body or under your skin.  Edema may make your legs, feet, and ankles swell up. Swelling is also common in looser tissues, like around your eyes.  Raise (elevate) the swollen body part above the level of your heart when you are sitting or lying down.  Follow your doctor's instructions about diet and how much fluid you can drink (fluid restriction). This information is not intended to replace advice given to you by your health care provider. Make sure you discuss any questions you have with your health care provider. Document Released: 04/01/2008 Document Revised: 11/01/2016 Document Reviewed: 11/01/2016 Elsevier Interactive Patient Education  2017 [employer].

## 2017-05-27 NOTE — ED Provider Notes (Signed)
[facility] DEPT Provider Note   CSN: [id number] Arrival date & time: 05/26/17  2221     History   Chief Complaint Chief Complaint  Patient presents with  . Leg Pain    L  . Leg Swelling    HPI Marie Torres is a 56 y.o. female.  Patient presents to the ED for evaluation of pain in left calf. The past started yesterday (05/26/17) morning. No known injury. She has noticed swelling to bilateral legs off and on. Since symptoms started the right leg has been swollen like previously seen but not the left. No SOB, or CP. No history of blood clots, no recent long travel, no exogenous hormones.    The history is provided by the patient. No language interpreter was used.  Leg Pain   Pertinent negatives include no numbness.    Past Medical History:  Diagnosis Date  . Arthritis    right hand  . Chicken pox    Childhood  . Diverticulosis of colon (without mention of hemorrhage)   . GERD (gastroesophageal reflux disease)   . Hiatal hernia   . Hyperplastic colon polyp   . Mass of finger of right hand 02/2013   right middle finger  . Seasonal allergies   . Sinus congestion 03/16/2013   cough, stuffy and runny nose  . Urine incontinence   . Vitamin D deficiency   . Wears partial dentures    upper    Patient Active Problem List   Diagnosis Date Noted  . Routine general medical examination at a health care facility 06/04/2016  . Visit for screening mammogram 06/04/2016  . Right-sided low back pain with right-sided sciatica 05/23/2016  . Hip pain, acute 05/23/2016  . Insomnia 08/17/2015  . Rectal incontinence 08/17/2015  . Pain due to interstitial cystitis 09/16/2014  . ESOPHAGITIS, REFLUX 08/14/2007    Past Surgical History:  Procedure Laterality Date  . COLONOSCOPY  08/14/2007   562.10, 211.3  . FOOT SURGERY Bilateral    exc. ingrown toenail great toe  . TUBAL LIGATION  09/02/2002    OB History    Gravida Para Term Preterm AB Living   1 1 1     1    SAB TAB  Ectopic Multiple Live Births                   Home Medications    Prior to Admission medications   Medication Sig Start Date End Date Taking? Authorizing Provider  acetaminophen (TYLENOL) 500 MG tablet Take 1 tablet (500 mg total) by mouth every 6 (six) hours as needed for mild pain or moderate pain. Patient not taking: Reported on 05/27/2017 01/26/17   Waynetta Pean, PA-C  Vitamin D, Ergocalciferol, (DRISDOL) 50000 units CAPS capsule Take 1 capsule (50,000 Units total) by mouth every 7 (seven) days. Patient not taking: Reported on 05/27/2017 02/28/17   Terrance Mass, MD    Family History Family History  Problem Relation Age of Onset  . Kidney disease Father   . Heart disease Father   . Stroke Father   . Hypertension Father   . Heart disease Mother   . Depression Mother   . Cancer Sister        lung  . Colon cancer Neg Hx     Social History Social History  Substance Use Topics  . Smoking status: Former Research scientist (life sciences)  . Smokeless tobacco: Never Used     Comment: quit smoking 20 years ago  . Alcohol use No  Allergies   Shellfish allergy   Review of Systems Review of Systems  Constitutional: Negative for chills and fever.  Respiratory: Negative.  Negative for shortness of breath.   Cardiovascular: Positive for leg swelling. Negative for chest pain.  Gastrointestinal: Negative.  Negative for nausea.  Musculoskeletal:       See HPI.  Skin: Negative.  Negative for color change and wound.  Neurological: Negative.  Negative for numbness.     Physical Exam Updated Vital Signs BP (!) 151/93 (BP Location: Right Arm)   Pulse 92   Temp 98.1 F (36.7 C) (Oral)   Resp 20   SpO2 98%   Physical Exam  Constitutional: She is oriented to person, place, and time. She appears well-developed and well-nourished.  Neck: Normal range of motion.  Cardiovascular: Intact distal pulses.   Pulmonary/Chest: Effort normal.  Musculoskeletal:  Right LE swelling, non-pitting. RLE is  non-tender to any palpation over foot, ankle, calf, or thigh. LLE has no visible swelling, redness and is not warm to touch. There is focal tenderness to posteromedial calf without firmness, hematoma, induration.  Neurological: She is alert and oriented to person, place, and time.  Skin: Skin is warm and dry.     ED Treatments / Results  Labs (all labs ordered are listed, but only abnormal results are displayed) Labs Reviewed - No data to display  EKG  EKG Interpretation None       Radiology No results found.  Procedures Procedures (including critical care time)  Medications Ordered in ED Medications - No data to display   Initial Impression / Assessment and Plan / ED Course  I have reviewed the triage vital signs and the nursing notes.  Pertinent labs & imaging results that were available during my care of the patient were reviewed by me and considered in my medical decision making (see chart for details).     Patient here for evaluation of left calf pain and intermittent bilateral LE edema. Exam supports focal musculoskeletal pain of left calf. No swelling at present. Doubt DVT. No symptoms of PE.   D-dimer is negative. Will treat with ibuprofen and encourage PCP follow up for recheck in 2-3 days. Return precautions discussed.   Final Clinical Impressions(s) / ED Diagnoses   Final diagnoses:  None   1. Left LE pain 2. Musculoskeletal pain  New Prescriptions New Prescriptions   No medications on file     Dennie Bible 05/27/17 0160    Ripley Fraise, MD 05/27/17 2332

## 2017-05-27 NOTE — Progress Notes (Signed)
Subjective:    Patient ID: Marie Torres, female    DOB: Sep 12, 1961, 56 y.o.   MRN: [id number]  Chief Complaint  Patient presents with  . Foot Swelling    having bilateral feet swelling, has had issues in the past with swelling but it went away for a while now her feet have stayed swollen longer    HPI:  Marie Torres is a 56 y.o. female who  has a past medical history of Arthritis; Chicken pox; Diverticulosis of colon (without mention of hemorrhage); GERD (gastroesophageal reflux disease); Hiatal hernia; Hyperplastic colon polyp; Mass of finger of right hand (02/2013); Seasonal allergies; Sinus congestion (03/16/2013); Urine incontinence; Vitamin D deficiency; and Wears partial dentures. and presents today for an office visit.  Associated symptom of edema located on her bilateral feet has been going on for about 2 days. Was seen in the ED for similar symptoms with D-dimer being negative with little concern for DVT or PE. Does have a fair amount sodium throughout the day with primarily soft drinks for hydration. Elevates her legs when lying down. Timing of the symptoms is generally worse over the course of the day. No trauma or injury that she is aware of.  Endorses snoring at night with some fatigue during the day with increased urination in the evening as well. No chest pain, shortness of breath, or heart palpitations.  Allergies  Allergen Reactions  . Shellfish Allergy Hives      Outpatient Medications Prior to Visit  Medication Sig Dispense Refill  . ibuprofen (ADVIL,MOTRIN) 600 MG tablet Take 1 tablet (600 mg total) by mouth every 6 (six) hours as needed. 30 tablet 0  . acetaminophen (TYLENOL) 500 MG tablet Take 1 tablet (500 mg total) by mouth every 6 (six) hours as needed for mild pain or moderate pain. (Patient not taking: Reported on 05/27/2017) 30 tablet 0  . Vitamin D, Ergocalciferol, (DRISDOL) 50000 units CAPS capsule Take 1 capsule (50,000 Units total) by mouth every 7 (seven)  days. (Patient not taking: Reported on 05/27/2017) 12 capsule 0   No facility-administered medications prior to visit.       Past Surgical History:  Procedure Laterality Date  . COLONOSCOPY  08/14/2007   562.10, 211.3  . FOOT SURGERY Bilateral    exc. ingrown toenail great toe  . TUBAL LIGATION  09/02/2002      Past Medical History:  Diagnosis Date  . Arthritis    right hand  . Chicken pox    Childhood  . Diverticulosis of colon (without mention of hemorrhage)   . GERD (gastroesophageal reflux disease)   . Hiatal hernia   . Hyperplastic colon polyp   . Mass of finger of right hand 02/2013   right middle finger  . Seasonal allergies   . Sinus congestion 03/16/2013   cough, stuffy and runny nose  . Urine incontinence   . Vitamin D deficiency   . Wears partial dentures    upper      Review of Systems  Constitutional: Negative for chills and fever.  Respiratory: Negative for chest tightness, shortness of breath and wheezing.   Cardiovascular: Positive for leg swelling. Negative for chest pain and palpitations.      Objective:    BP 122/74 (BP Location: Left Arm, Patient Position: Sitting, Cuff Size: Large)   Pulse 79   Temp 98.4 F (36.9 C) (Oral)   Resp 16   Ht 5\' 1"  (1.549 m)   Wt 214 lb 12.8 oz (97.4 kg)  SpO2 95%   BMI 40.59 kg/m  Nursing note and vital signs reviewed.  Physical Exam  Constitutional: She is oriented to person, place, and time. She appears well-developed and well-nourished. No distress.  Cardiovascular: Normal rate, regular rhythm, normal heart sounds and intact distal pulses.   Mild/moderate lateral pitting lower extremity edema with good pulses. Negative Homans sign.  Pulmonary/Chest: Effort normal and breath sounds normal.  Neurological: She is alert and oriented to person, place, and time.  Skin: Skin is warm and dry.  Psychiatric: She has a normal mood and affect. Her behavior is normal. Judgment and thought content normal.        Assessment & Plan:   Problem List Items Addressed This Visit      Other   Bilateral edema of lower extremity - Primary    Bilateral lower extremity edema most likely associated with poor nutrition with concern for possible sleep apnea. Encouraged to decrease sodium intake, elevate legs when seated, and use compressions socks as needed. Start hydrochlorothiazide as needed for swelling. Recommend sleep study to rule out sleep apnea. Previous blood work reviewed with kidney and liver function appearing normal. Continue to monitor.          I am having Ms. Maddalena start on hydrochlorothiazide. I am also having her maintain her acetaminophen, Vitamin D (Ergocalciferol), and ibuprofen.   Meds ordered this encounter  Medications  . hydrochlorothiazide (HYDRODIURIL) 25 MG tablet    Sig: Take 0.5-1 tablets (12.5-25 mg total) by mouth daily as needed. For edema    Dispense:  30 tablet    Refill:  0    Order Specific Question:   Supervising Provider    Answer:   Pricilla Holm A [[id number]]     Follow-up: Return if symptoms worsen or fail to improve.  Mauricio Po, FNP

## 2017-05-27 NOTE — Assessment & Plan Note (Signed)
Bilateral lower extremity edema most likely associated with poor nutrition with concern for possible sleep apnea. Encouraged to decrease sodium intake, elevate legs when seated, and use compressions socks as needed. Start hydrochlorothiazide as needed for swelling. Recommend sleep study to rule out sleep apnea. Previous blood work reviewed with kidney and liver function appearing normal. Continue to monitor.

## 2017-06-05 ENCOUNTER — Other Ambulatory Visit: Payer: Self-pay

## 2017-06-23 ENCOUNTER — Other Ambulatory Visit: Payer: Self-pay | Admitting: Family

## 2017-06-24 ENCOUNTER — Other Ambulatory Visit: Payer: Self-pay | Admitting: *Deleted

## 2017-07-02 ENCOUNTER — Other Ambulatory Visit: Payer: Self-pay

## 2017-11-27 ENCOUNTER — Ambulatory Visit (INDEPENDENT_AMBULATORY_CARE_PROVIDER_SITE_OTHER): Payer: BLUE CROSS/BLUE SHIELD | Admitting: Internal Medicine

## 2017-11-27 ENCOUNTER — Ambulatory Visit (INDEPENDENT_AMBULATORY_CARE_PROVIDER_SITE_OTHER)
Admission: RE | Admit: 2017-11-27 | Discharge: 2017-11-27 | Disposition: A | Discharge status: Home / Self Care | Payer: BLUE CROSS/BLUE SHIELD | Source: Ambulatory Visit | Attending: Internal Medicine | Admitting: Internal Medicine

## 2017-11-27 ENCOUNTER — Encounter: Payer: Self-pay | Admitting: Internal Medicine

## 2017-11-27 VITALS — BP 130/70 | HR 71 | Temp 97.6°F | Resp 16 | Ht 61.0 in | Wt 209.0 lb

## 2017-11-27 DIAGNOSIS — R05 Cough: Secondary | ICD-10-CM

## 2017-11-27 DIAGNOSIS — J988 Other specified respiratory disorders: Secondary | ICD-10-CM | POA: Insufficient documentation

## 2017-11-27 DIAGNOSIS — R059 Cough, unspecified: Secondary | ICD-10-CM

## 2017-11-27 LAB — POCT EXHALED NITRIC OXIDE: FENO LEVEL (PPB): 12

## 2017-11-27 MED ORDER — CEFDINIR 300 MG PO CAPS
300.0000 mg | ORAL_CAPSULE | Freq: Two times a day (BID) | ORAL | 0 refills | Status: AC
Start: 2017-11-27 — End: 2017-12-07

## 2017-11-27 MED ORDER — HYDROCODONE-HOMATROPINE 5-1.5 MG/5ML PO SYRP: 5.0000 mL | ORAL_SOLUTION | Freq: Three times a day (TID) | ORAL | 0 refills | Status: DC | PRN

## 2017-11-27 NOTE — Patient Instructions (Signed)
Cough, Adult  Coughing is a reflex that clears your throat and your airways. Coughing helps to heal and protect your lungs. It is normal to cough occasionally, but a cough that happens with other symptoms or lasts a long time may be a sign of a condition that needs treatment. A cough may last only 2-3 weeks (acute), or it may last longer than 8 weeks (chronic).  What are the causes?  Coughing is commonly caused by:   Breathing in substances that irritate your lungs.   A viral or bacterial respiratory infection.   Allergies.   Asthma.   Postnasal drip.   Smoking.   Acid backing up from the stomach into the esophagus (gastroesophageal reflux).   Certain medicines.   Chronic lung problems, including COPD (or rarely, lung cancer).   Other medical conditions such as heart failure.    Follow these instructions at home:  Pay attention to any changes in your symptoms. Take these actions to help with your discomfort:   Take medicines only as told by your health care provider.  ? If you were prescribed an antibiotic medicine, take it as told by your health care provider. Do not stop taking the antibiotic even if you start to feel better.  ? Talk with your health care provider before you take a cough suppressant medicine.   Drink enough fluid to keep your urine clear or pale yellow.   If the air is dry, use a cold steam vaporizer or humidifier in your bedroom or your home to help loosen secretions.   Avoid anything that causes you to cough at work or at home.   If your cough is worse at night, try sleeping in a semi-upright position.   Avoid cigarette smoke. If you smoke, quit smoking. If you need help quitting, ask your health care provider.   Avoid caffeine.   Avoid alcohol.   Rest as needed.    Contact a health care provider if:   You have new symptoms.   You cough up pus.   Your cough does not get better after 2-3 weeks, or your cough gets worse.   You cannot control your cough with suppressant  medicines and you are losing sleep.   You develop pain that is getting worse or pain that is not controlled with pain medicines.   You have a fever.   You have unexplained weight loss.   You have night sweats.  Get help right away if:   You cough up blood.   You have difficulty breathing.   Your heartbeat is very fast.  This information is not intended to replace advice given to you by your health care provider. Make sure you discuss any questions you have with your health care provider.  Document Released: 04/12/2011 Document Revised: 03/21/2016 Document Reviewed: 12/21/2014  Elsevier Interactive Patient Education  2018 [employer]

## 2017-11-27 NOTE — Progress Notes (Signed)
Subjective:  Patient ID: Marie Torres, female    DOB: 02-14-1961  Age: 57 y.o. MRN: [id number]  CC: Cough   HPI Marie Torres presents for a 4-week history of cough that is intermittently productive of yellow phlegm.  The cough worsens at night and keeps her awake.  She has had a few episodes of shortness of breath but she denies wheezing, night sweats, hemoptysis, fever, or chills.  Outpatient Medications Prior to Visit  Medication Sig Dispense Refill  . hydrochlorothiazide (HYDRODIURIL) 25 MG tablet TAKE 0.5-1 TABLETS (12.5-25 MG TOTAL) BY MOUTH DAILY AS NEEDED. FOR EDEMA (Patient not taking: Reported on 11/27/2017) 30 tablet 0  . acetaminophen (TYLENOL) 500 MG tablet Take 1 tablet (500 mg total) by mouth every 6 (six) hours as needed for mild pain or moderate pain. (Patient not taking: Reported on 05/27/2017) 30 tablet 0  . ibuprofen (ADVIL,MOTRIN) 600 MG tablet Take 1 tablet (600 mg total) by mouth every 6 (six) hours as needed. 30 tablet 0  . Vitamin D, Ergocalciferol, (DRISDOL) 50000 units CAPS capsule Take 1 capsule (50,000 Units total) by mouth every 7 (seven) days. (Patient not taking: Reported on 05/27/2017) 12 capsule 0   No facility-administered medications prior to visit.     ROS Review of Systems  Constitutional: Negative for appetite change, chills, diaphoresis, fatigue and fever.  HENT: Negative.   Eyes: Negative.   Respiratory: Positive for cough and shortness of breath. Negative for wheezing and stridor.   Cardiovascular: Negative for chest pain, palpitations and leg swelling.  Gastrointestinal: Negative for abdominal pain, constipation, diarrhea, nausea and vomiting.  Endocrine: Negative.  Negative for cold intolerance.  Genitourinary: Negative.  Negative for difficulty urinating.  Musculoskeletal: Negative.  Negative for back pain, myalgias and neck pain.  Skin: Negative.  Negative for color change and rash.  Allergic/Immunologic: Negative.   Neurological: Negative.   Negative for dizziness.  Hematological: Negative for adenopathy. Does not bruise/bleed easily.  Psychiatric/Behavioral: Negative.     Objective:  BP 130/70 (BP Location: Left Arm, Patient Position: Sitting, Cuff Size: Large)   Pulse 71   Temp 97.6 F (36.4 C) (Oral)   Resp 16   Ht 5\' 1"  (1.549 m)   Wt 209 lb (94.8 kg)   SpO2 96%   BMI 39.49 kg/m   BP Readings from Last 3 Encounters:  11/27/17 130/70  05/27/17 122/74  05/27/17 111/64    Wt Readings from Last 3 Encounters:  11/27/17 209 lb (94.8 kg)  05/27/17 214 lb 12.8 oz (97.4 kg)  02/24/17 212 lb 9.6 oz (96.4 kg)    Physical Exam  Constitutional: She is oriented to person, place, and time. No distress.  HENT:  Mouth/Throat: Oropharynx is clear and moist. No oropharyngeal exudate.  Eyes: Conjunctivae are normal. Left eye exhibits no discharge. No scleral icterus.  Neck: Normal range of motion. Neck supple. No JVD present. No thyromegaly present.  Cardiovascular: Normal rate, regular rhythm and normal heart sounds. Exam reveals no gallop.  No murmur heard. Pulmonary/Chest: Effort normal and breath sounds normal. No respiratory distress. She has no wheezes. She has no rales.  Abdominal: Soft. Bowel sounds are normal. She exhibits no distension and no mass. There is no tenderness. There is no guarding.  Musculoskeletal: Normal range of motion. She exhibits no edema, tenderness or deformity.  Lymphadenopathy:    She has no cervical adenopathy.  Neurological: She is alert and oriented to person, place, and time.  Skin: Skin is warm and dry. No rash noted.  She is not diaphoretic. No erythema.  Vitals reviewed.   Lab Results  Component Value Date   WBC 8.0 02/27/2017   HGB 13.3 02/27/2017   HCT 41.2 02/27/2017   PLT 321 02/27/2017   GLUCOSE 104 (H) 02/27/2017   CHOL 228 (H) 02/27/2017   TRIG 120 02/27/2017   HDL 54 02/27/2017   LDLDIRECT 150.8 11/30/2013   LDLCALC 150 (H) 02/27/2017   ALT 14 02/27/2017   AST 18  02/27/2017   NA 139 02/27/2017   K 4.7 02/27/2017   CL 106 02/27/2017   CREATININE 0.84 02/27/2017   BUN 17 02/27/2017   CO2 23 02/27/2017   TSH 1.11 02/27/2017    No results found.  Assessment & Plan:   Marie Torres was seen today for cough.  Diagnoses and all orders for this visit:  Cough- Her chest x-ray is negative for mass or infiltrate.  Her FeNO score is not elevated so I do not think she is experiencing an allergic phenomenon or asthma. -     DG Chest 2 View; Future -     HYDROcodone-homatropine (HYCODAN) 5-1.5 MG/5ML syrup; Take 5 mLs by mouth every 8 (eight) hours as needed for cough. -     POCT EXHALED NITRIC OXIDE  RTI (respiratory tract infection)- I will treat the infection with Omnicef and will control the cough with Hycodan as needed. -     cefdinir (OMNICEF) 300 MG capsule; Take 1 capsule (300 mg total) by mouth 2 (two) times daily for 10 days. -     HYDROcodone-homatropine (HYCODAN) 5-1.5 MG/5ML syrup; Take 5 mLs by mouth every 8 (eight) hours as needed for cough.   I have discontinued Marie Torres acetaminophen, Vitamin D (Ergocalciferol), and ibuprofen. I am also having her start on cefdinir and HYDROcodone-homatropine. Additionally, I am having her maintain her hydrochlorothiazide.  Meds ordered this encounter  Medications  . cefdinir (OMNICEF) 300 MG capsule    Sig: Take 1 capsule (300 mg total) by mouth 2 (two) times daily for 10 days.    Dispense:  20 capsule    Refill:  0  . HYDROcodone-homatropine (HYCODAN) 5-1.5 MG/5ML syrup    Sig: Take 5 mLs by mouth every 8 (eight) hours as needed for cough.    Dispense:  120 mL    Refill:  0     Follow-up: Return in about 3 weeks (around 12/18/2017).  Marie Calico, MD

## 2017-12-18 ENCOUNTER — Ambulatory Visit (INDEPENDENT_AMBULATORY_CARE_PROVIDER_SITE_OTHER): Payer: BLUE CROSS/BLUE SHIELD | Admitting: Internal Medicine

## 2017-12-18 ENCOUNTER — Encounter: Payer: Self-pay | Admitting: Internal Medicine

## 2017-12-18 ENCOUNTER — Other Ambulatory Visit (INDEPENDENT_AMBULATORY_CARE_PROVIDER_SITE_OTHER): Payer: BLUE CROSS/BLUE SHIELD

## 2017-12-18 VITALS — BP 120/70 | HR 74 | Temp 98.0°F | Resp 16 | Ht 61.0 in | Wt 207.5 lb

## 2017-12-18 DIAGNOSIS — M7989 Other specified soft tissue disorders: Secondary | ICD-10-CM

## 2017-12-18 DIAGNOSIS — M79662 Pain in left lower leg: Secondary | ICD-10-CM

## 2017-12-18 LAB — CBC WITH DIFFERENTIAL/PLATELET
BASOS PCT: 1.1 % (ref 0.0–3.0)
Basophils Absolute: 0.1 10*3/uL (ref 0.0–0.1)
EOS ABS: 0.3 10*3/uL (ref 0.0–0.7)
EOS PCT: 2.3 % (ref 0.0–5.0)
HCT: 36.8 % (ref 36.0–46.0)
Hemoglobin: 12.3 g/dL (ref 12.0–15.0)
LYMPHS ABS: 2.6 10*3/uL (ref 0.7–4.0)
Lymphocytes Relative: 24.4 % (ref 12.0–46.0)
MCHC: 33.5 g/dL (ref 30.0–36.0)
MCV: 85.6 fl (ref 78.0–100.0)
MONO ABS: 0.8 10*3/uL (ref 0.1–1.0)
Monocytes Relative: 6.9 % (ref 3.0–12.0)
NEUTROS PCT: 65.3 % (ref 43.0–77.0)
Neutro Abs: 7.1 10*3/uL (ref 1.4–7.7)
Platelets: 318 10*3/uL (ref 150.0–400.0)
RBC: 4.3 Mil/uL (ref 3.87–5.11)
RDW: 12.8 % (ref 11.5–15.5)
WBC: 10.8 10*3/uL — ABNORMAL HIGH (ref 4.0–10.5)

## 2017-12-18 LAB — SEDIMENTATION RATE: Sed Rate: 58 mm/hr — ABNORMAL HIGH (ref 0–30)

## 2017-12-18 LAB — D-DIMER, QUANTITATIVE: D-Dimer, Quant: 0.85 mcg/mL FEU — ABNORMAL HIGH (ref <0.50)

## 2017-12-18 MED ORDER — RIVAROXABAN 15 MG PO TABS: 15.0000 mg | ORAL_TABLET | Freq: Two times a day (BID) | ORAL | 0 refills | Status: DC

## 2017-12-18 NOTE — Patient Instructions (Signed)
Deep Vein Thrombosis Deep vein thrombosis (DVT) is a condition in which a blood clot forms in a deep vein, such as a lower leg, thigh, or arm vein. A clot is blood that has thickened into a gel or solid. This condition is dangerous. It can lead to serious and even life-threatening complications if the clot travels to the lungs and causes a blockage (pulmonary embolism). It can also damage veins in the leg. This can result in leg pain, swelling, discoloration, and sores (post-thrombotic syndrome). What are the causes? This condition may be caused by:  A slowdown of blood flow.  Damage to a vein.  A condition that makes blood clot more easily.  What increases the risk? The following factors may make you more likely to develop this condition:  Being overweight.  Being elderly, especially over age 60.  Sitting or lying down for more than four hours.  Lack of physical activity (sedentary lifestyle).  Being pregnant, giving birth, or having recently given birth.  Taking medicines that contain estrogen.  Smoking.  A history of any of the following: ? Blood clots or blood clotting disease. ? Peripheral vascular disease. ? Inflammatory bowel disease. ? Cancer. ? Heart disease. ? Genetic conditions that affect how blood clots. ? Neurological diseases that affect the legs (leg paresis). ? Injury. ? Major or lengthy surgery. ? A central line placed inside a large vein.  What are the signs or symptoms? Symptoms of this condition include:  Swelling, pain, or tenderness in an arm or leg.  Warmth, redness, or discoloration in an arm or leg.  If the clot is in your leg, symptoms may be more noticeable or worse when you stand or walk. Some people do not have any symptoms. How is this diagnosed? This condition is diagnosed with:  A medical history.  A physical exam.  Tests, such as: ? Blood tests. These are done to see how your blood clots. ? Imaging tests. These are done to  check for clots. Tests may include:  Ultrasound.  CT scan.  MRI.  X-ray.  Venogram. For this test, X-rays are taken after a dye is injected into a vein.  How is this treated? Treatment for this condition depends on the cause, your risk for bleeding or developing more clots, and any medical conditions you have. Treatment may include:  Taking blood thinners (also called anticoagulants). These medicines may be taken by mouth, injected under the skin, or injected through an IV tube (catheter). These medicines prevent clots from forming.  Injecting medicine that dissolves blood clots into the affected vein (catheter-directed thrombolysis).  Having surgery. Surgery may be done to: ? Remove the clot. ? Place a filter in a large vein to catch blood clots before they reach the lungs.  Some treatments may be continued for up to six months. Follow these instructions at home: If you are taking an oral blood thinner:  Take the medicine exactly as told by your health care provider. Some blood thinners need to be taken at the same time every day. Do not skip a dose.  Ask your health care provider about what foods and drugs interact with the medicine.  Ask about possible side effects. General instructions  Blood thinners can cause easy bruising and difficulty stopping bleeding. Because of this, if you are taking or were given a blood thinner: ? Hold pressure over cuts for longer than usual. ? Tell your dentist and other health care providers that you are taking blood thinners before   having any procedures that can cause bleeding. ? Avoid contact sports.  Take over-the-counter and prescription medicines only as told by your health care provider.  Return to your normal activities as told by your health care provider. Ask your health care provider what activities are safe for you.  Wear compression stockings if recommended by your health care provider.  Keep all follow-up visits as told by  your health care provider. This is important. How is this prevented? To lower your risk of developing this condition again:  For 30 or more minutes every day, do an activity that: ? Involves moving your arms and legs. ? Increases your heart rate.  When traveling for longer than four hours: ? Exercise your arms and legs every hour. ? Drink plenty of water. ? Avoid drinking alcohol.  Avoid sitting or lying for a long time without moving your legs.  Stay a healthy weight.  If you are a woman who is older than age 35, avoid unnecessary use of medicines that contain estrogen.  Do not use any products that contain nicotine or tobacco, such as cigarettes and e-cigarettes. This is especially important if you take estrogen medicines. If you need help quitting, ask your health care provider.  Contact a health care provider if:  You miss a dose of your blood thinner.  You have nausea, vomiting, or diarrhea that lasts for more than one day.  Your menstrual period is heavier than usual.  You have unusual bruising. Get help right away if:  You have new or increased pain, swelling, or redness in an arm or leg.  You have numbness or tingling in an arm or leg.  You have shortness of breath.  You have chest pain.  You have a rapid or irregular heartbeat.  You feel light-headed or dizzy.  You cough up blood.  There is blood in your vomit, stool, or urine.  You have a serious fall or accident, or you hit your head.  You have a severe headache or confusion.  You have a cut that will not stop bleeding. These symptoms may represent a serious problem that is an emergency. Do not wait to see if the symptoms will go away. Get medical help right away. Call your local emergency services (911 in the [place] Do not drive yourself to the hospital. Summary  DVT is a condition in which a blood clot forms in a deep vein, such as a lower leg, thigh, or arm vein.  Symptoms can include swelling,  warmth, pain, and redness in your leg or arm.  Treatment may include taking blood thinners, injecting medicine that dissolves blood clots,wearing compression stockings, or surgery.  If you are prescribed blood thinners, take them exactly as told. This information is not intended to replace advice given to you by your health care provider. Make sure you discuss any questions you have with your health care provider. Document Released: 10/14/2005 Document Revised: 11/16/2016 Document Reviewed: 11/16/2016 Elsevier Interactive Patient Education  2018 [employer] Inc[employer]  

## 2017-12-18 NOTE — Progress Notes (Signed)
Subjective:  Patient ID: Marie Torres, female    DOB: [birth year]  Age: 57 y.o. MRN: [id number]  CC: Hypertension   HPI [name] presents for concerns about a several day history of pain and swelling over her left lower extremity and ankle.  She denies any trauma or injury.  Outpatient Medications Prior to Visit  Medication Sig Dispense Refill  . hydrochlorothiazide (HYDRODIURIL) 25 MG tablet TAKE 0.5-1 TABLETS (12.5-25 MG TOTAL) BY MOUTH DAILY AS NEEDED. FOR EDEMA 30 tablet 0  . HYDROcodone-homatropine (HYCODAN) 5-1.5 MG/5ML syrup Take 5 mLs by mouth every 8 (eight) hours as needed for cough. 120 mL 0   No facility-administered medications prior to visit.     ROS Review of Systems  Constitutional: Negative for diaphoresis, fatigue and fever.  HENT: Negative.  Negative for trouble swallowing.   Eyes: Negative for photophobia.  Respiratory: Negative for cough, chest tightness, shortness of breath and wheezing.   Cardiovascular: Positive for leg swelling. Negative for chest pain and palpitations.  Gastrointestinal: Negative for abdominal pain, constipation, diarrhea, nausea and vomiting.  Endocrine: Negative.   Genitourinary: Negative.  Negative for difficulty urinating.  Musculoskeletal: Negative.  Negative for back pain and myalgias.  Skin: Negative.  Negative for color change, pallor and rash.  Allergic/Immunologic: Negative.   Neurological: Negative for dizziness, weakness, light-headedness and numbness.  Hematological: Negative for adenopathy. Does not bruise/bleed easily.  Psychiatric/Behavioral: Negative.     Objective:  BP 120/70 (BP Location: Left Arm, Patient Position: Sitting, Cuff Size: Large)   Pulse 74   Temp 98 F (36.7 C) (Oral)   Resp 16   Ht 5\' 1"  (1.549 m)   Wt 207 lb 8 oz (94.1 kg)   SpO2 98%   BMI 39.21 kg/m   BP Readings from Last 3 Encounters:  12/18/17 120/70  11/27/17 130/70  05/27/17 122/74    Wt Readings from Last 3 Encounters:    12/18/17 207 lb 8 oz (94.1 kg)  11/27/17 209 lb (94.8 kg)  05/27/17 214 lb 12.8 oz (97.4 kg)    Physical Exam  Constitutional: She is oriented to person, place, and time. No distress.  HENT:  Mouth/Throat: Oropharynx is clear and moist. No oropharyngeal exudate.  Eyes: Conjunctivae are normal. Left eye exhibits no discharge. No scleral icterus.  Neck: Normal range of motion. Neck supple. No JVD present. No thyromegaly present.  Cardiovascular: Normal rate, regular rhythm and normal heart sounds. Exam reveals no gallop and no friction rub.  No murmur heard. Pulses:      Carotid pulses are 1+ on the right side, and 1+ on the left side.      Radial pulses are 1+ on the right side, and 1+ on the left side.       Femoral pulses are 1+ on the right side, and 1+ on the left side.      Popliteal pulses are 1+ on the right side, and 1+ on the left side.       Dorsalis pedis pulses are 1+ on the right side, and 1+ on the left side.       Posterior tibial pulses are 1+ on the right side, and 1+ on the left side.  Pulmonary/Chest: Effort normal and breath sounds normal. No respiratory distress. She has no wheezes. She has no rales.  Abdominal: Soft. Bowel sounds are normal. She exhibits no distension and no mass. There is no tenderness. There is no guarding.  Musculoskeletal: Normal range of motion. She exhibits edema. She  exhibits no tenderness or deformity.  There is trace pitting edema around the right ankle. There is 2-3+ pitting edema in the left lower extremity that extends up to the knee.  The left calf is tender and tight.  Lymphadenopathy:    She has no cervical adenopathy.  Neurological: She is alert and oriented to person, place, and time.  Skin: Skin is warm and dry. No rash noted. She is not diaphoretic. No erythema. No pallor.    Lab Results  Component Value Date   WBC 10.8 (H) 12/18/2017   HGB 12.3 12/18/2017   HCT 36.8 12/18/2017   PLT 318.0 12/18/2017   GLUCOSE 104 (H)  02/27/2017   CHOL 228 (H) 02/27/2017   TRIG 120 02/27/2017   HDL 54 02/27/2017   LDLDIRECT 150.8 11/30/2013   LDLCALC 150 (H) 02/27/2017   ALT 14 02/27/2017   AST 18 02/27/2017   NA 139 02/27/2017   K 4.7 02/27/2017   CL 106 02/27/2017   CREATININE 0.84 02/27/2017   BUN 17 02/27/2017   CO2 23 02/27/2017   TSH 1.11 02/27/2017    Dg Chest 2 View  Result Date: 11/27/2017 CLINICAL DATA:  Cough, congestion for 1 month. EXAM: CHEST  2 VIEW COMPARISON:  02/24/2014 FINDINGS: Normal heart size. Lungs clear. No pneumothorax. No pleural effusion. IMPRESSION: No active cardiopulmonary disease. Electronically Signed   By: Marybelle Killings M.D.   On: 11/27/2017 15:53    Assessment & Plan:   Niyati was seen today for hypertension.  Diagnoses and all orders for this visit:  Pain and swelling of lower leg, left- She has pitting edema in the left lower extremity and a slightly elevated d-dimer so initially my impression was that she had a DVT.  I started her on Xarelto but then the next day found out that her ultrasound was negative for deep venous thrombosis.  She has been asked to stop taking Xarelto.  I now think the edema may be related to obesity, excessive sodium intake, and poor conditioning.  I have asked her to be compliant with the thiazide diuretic, to elevate her lower extremities, and to wear compression devices. -     CBC with Differential/Platelet; Future -     D-dimer, quantitative (not at [facility]); Future -     Sedimentation rate; Future -     VAS [place] LOWER EXTREMITY VENOUS (DVT); Future -     Rivaroxaban (XARELTO) 15 MG TABS tablet; Take 1 tablet (15 mg total) by mouth 2 (two) times daily with a meal for 21 days.   I have discontinued Crista Luria HYDROcodone-homatropine. I am also having her start on Rivaroxaban. Additionally, I am having her maintain her hydrochlorothiazide.  Meds ordered this encounter  Medications  . Rivaroxaban (XARELTO) 15 MG TABS tablet    Sig: Take 1  tablet (15 mg total) by mouth 2 (two) times daily with a meal for 21 days.    Dispense:  42 tablet    Refill:  0     Follow-up: Return in about 3 weeks (around 01/08/2018).  Scarlette Calico, MD

## 2017-12-19 ENCOUNTER — Telehealth: Payer: Self-pay | Admitting: Internal Medicine

## 2017-12-19 ENCOUNTER — Ambulatory Visit (HOSPITAL_COMMUNITY)
Admission: RE | Admit: 2017-12-19 | Discharge: 2017-12-19 | Disposition: A | Discharge status: Home / Self Care | Payer: BLUE CROSS/BLUE SHIELD | Source: Ambulatory Visit | Attending: Vascular Surgery | Admitting: Vascular Surgery

## 2017-12-19 DIAGNOSIS — M79662 Pain in left lower leg: Secondary | ICD-10-CM | POA: Insufficient documentation

## 2017-12-19 DIAGNOSIS — M7989 Other specified soft tissue disorders: Secondary | ICD-10-CM | POA: Insufficient documentation

## 2017-12-19 NOTE — Telephone Encounter (Signed)
Yes, she should And STOP taking Xarelto Please return soon to recheck the leg swelling

## 2017-12-19 NOTE — Telephone Encounter (Signed)
Call report came back negative for DVT.  Pt would like to know if she should buy compression stockings.

## 2017-12-19 NOTE — Telephone Encounter (Signed)
Copied from [place] [id number]. Topic: Inquiry >> Dec 19, 2017  9:12 AM Pricilla Handler wrote: Reason for CRM: Danae Chen with [facility] called wanting give report on this patient. [facility] Nurse were all on other calls at the time. Please call Erica at [phone].        Thank You!!!

## 2017-12-19 NOTE — Telephone Encounter (Signed)
Left detailed message with the previous note information.

## 2018-01-05 ENCOUNTER — Other Ambulatory Visit (INDEPENDENT_AMBULATORY_CARE_PROVIDER_SITE_OTHER): Payer: BLUE CROSS/BLUE SHIELD

## 2018-01-05 ENCOUNTER — Ambulatory Visit (INDEPENDENT_AMBULATORY_CARE_PROVIDER_SITE_OTHER): Payer: BLUE CROSS/BLUE SHIELD | Admitting: Internal Medicine

## 2018-01-05 ENCOUNTER — Encounter: Payer: Self-pay | Admitting: Internal Medicine

## 2018-01-05 VITALS — BP 124/80 | HR 83 | Temp 98.7°F | Resp 16 | Ht 61.0 in | Wt 205.0 lb

## 2018-01-05 DIAGNOSIS — R600 Localized edema: Secondary | ICD-10-CM

## 2018-01-05 LAB — URINALYSIS, ROUTINE W REFLEX MICROSCOPIC
Bilirubin Urine: NEGATIVE
HGB URINE DIPSTICK: NEGATIVE
Ketones, ur: NEGATIVE
Leukocytes, UA: NEGATIVE
Nitrite: NEGATIVE
RBC / HPF: NONE SEEN (ref 0–?)
Specific Gravity, Urine: 1.02 (ref 1.000–1.030)
TOTAL PROTEIN, URINE-UPE24: NEGATIVE
URINE GLUCOSE: NEGATIVE
Urobilinogen, UA: 1 (ref 0.0–1.0)
pH: 7 (ref 5.0–8.0)

## 2018-01-05 LAB — COMPREHENSIVE METABOLIC PANEL
ALK PHOS: 57 U/L (ref 39–117)
ALT: 13 U/L (ref 0–35)
AST: 16 U/L (ref 0–37)
Albumin: 3.7 g/dL (ref 3.5–5.2)
BILIRUBIN TOTAL: 0.4 mg/dL (ref 0.2–1.2)
BUN: 13 mg/dL (ref 6–23)
CO2: 30 mEq/L (ref 19–32)
Calcium: 9.4 mg/dL (ref 8.4–10.5)
Chloride: 105 mEq/L (ref 96–112)
Creatinine, Ser: 0.77 mg/dL (ref 0.40–1.20)
GFR: 98.78 mL/min (ref >60.00)
GLUCOSE: 91 mg/dL (ref 70–99)
POTASSIUM: 3.8 meq/L (ref 3.5–5.1)
SODIUM: 139 meq/L (ref 135–145)
TOTAL PROTEIN: 7.6 g/dL (ref 6.0–8.3)

## 2018-01-05 MED ORDER — HYDROCHLOROTHIAZIDE 25 MG PO TABS: 12.5000 mg | ORAL_TABLET | Freq: Every day | ORAL | 0 refills | Status: DC | PRN

## 2018-01-05 NOTE — Patient Instructions (Signed)
Edema Edema is an abnormal buildup of fluids in your bodytissues. Edema is somewhatdependent on gravity to pull the fluid to the lowest place in your body. That makes the condition more common in the legs and thighs (lower extremities). Painless swelling of the feet and ankles is common and becomes more likely as you get older. It is also common in looser tissues, like around your eyes. When the affected area is squeezed, the fluid may move out of that spot and leave a dent for a few moments. This dent is called pitting. What are the causes? There are many possible causes of edema. Eating too much salt and being on your feet or sitting for a long time can cause edema in your legs and ankles. Hot weather may make edema worse. Common medical causes of edema include:  Heart failure.  Liver disease.  Kidney disease.  Weak blood vessels in your legs.  Cancer.  An injury.  Pregnancy.  Some medications.  Obesity.  What are the signs or symptoms? Edema is usually painless.Your skin may look swollen or shiny. How is this diagnosed? Your health care provider may be able to diagnose edema by asking about your medical history and doing a physical exam. You may need to have tests such as X-rays, an electrocardiogram, or blood tests to check for medical conditions that may cause edema. How is this treated? Edema treatment depends on the cause. If you have heart, liver, or kidney disease, you need the treatment appropriate for these conditions. General treatment may include:  Elevation of the affected body part above the level of your heart.  Compression of the affected body part. Pressure from elastic bandages or support stockings squeezes the tissues and forces fluid back into the blood vessels. This keeps fluid from entering the tissues.  Restriction of fluid and salt intake.  Use of a water pill (diuretic). These medications are appropriate only for some types of edema. They pull fluid  out of your body and make you urinate more often. This gets rid of fluid and reduces swelling, but diuretics can have side effects. Only use diuretics as directed by your health care provider.  Follow these instructions at home:  Keep the affected body part above the level of your heart when you are lying down.  Do not sit still or stand for prolonged periods.  Do not put anything directly under your knees when lying down.  Do not wear constricting clothing or garters on your upper legs.  Exercise your legs to work the fluid back into your blood vessels. This may help the swelling go down.  Wear elastic bandages or support stockings to reduce ankle swelling as directed by your health care provider.  Eat a low-salt diet to reduce fluid if your health care provider recommends it.  Only take medicines as directed by your health care provider. Contact a health care provider if:  Your edema is not responding to treatment.  You have heart, liver, or kidney disease and notice symptoms of edema.  You have edema in your legs that does not improve after elevating them.  You have sudden and unexplained weight gain. Get help right away if:  You develop shortness of breath or chest pain.  You cannot breathe when you lie down.  You develop pain, redness, or warmth in the swollen areas.  You have heart, liver, or kidney disease and suddenly get edema.  You have a fever and your symptoms suddenly get worse. This information is   not intended to replace advice given to you by your health care provider. Make sure you discuss any questions you have with your health care provider. Document Released: 10/14/2005 Document Revised: 03/21/2016 Document Reviewed: 08/06/2013 Elsevier Interactive Patient Education  2017 Elsevier Inc.  

## 2018-01-05 NOTE — Progress Notes (Signed)
Subjective:  Patient ID: Marie Torres, female    DOB: 1961/05/24  Age: 57 y.o. MRN: [id number]  CC: Leg Swelling   HPI Marie Torres presents for f/up - She was recently seen for painful swelling in her left lower extremity.  There was concern that she might have had a DVT but the ultrasound was negative for DVT or superficial thrombophlebitis.  She now tells me that she has intermittent swelling in both lower extremities.  She thinks this may be caused by the intake of high sodium foods and the occasional dose of Aleve.  She does tells me that hydrochlorothiazide controls the symptoms.  Since I last saw her the swelling and pain in her lower extremities has resolved.  Outpatient Medications Prior to Visit  Medication Sig Dispense Refill  . hydrochlorothiazide (HYDRODIURIL) 25 MG tablet TAKE 0.5-1 TABLETS (12.5-25 MG TOTAL) BY MOUTH DAILY AS NEEDED. FOR EDEMA (Patient not taking: Reported on 01/05/2018) 30 tablet 0  . Rivaroxaban (XARELTO) 15 MG TABS tablet Take 1 tablet (15 mg total) by mouth 2 (two) times daily with a meal for 21 days. (Patient not taking: Reported on 01/05/2018) 42 tablet 0   No facility-administered medications prior to visit.     ROS Review of Systems  Constitutional: Negative for appetite change, diaphoresis, fatigue and unexpected weight change.  HENT: Negative.   Eyes: Negative for visual disturbance.  Respiratory: Negative for chest tightness, shortness of breath and wheezing.   Cardiovascular: Positive for leg swelling. Negative for chest pain and palpitations.  Gastrointestinal: Negative for abdominal pain, constipation, diarrhea, nausea and vomiting.  Genitourinary: Negative.  Negative for decreased urine volume, difficulty urinating, dysuria, hematuria and urgency.  Musculoskeletal: Negative.  Negative for back pain and myalgias.  Skin: Negative.   Neurological: Negative.  Negative for dizziness, weakness and light-headedness.  Psychiatric/Behavioral:  Negative.     Objective:  BP 124/80 (BP Location: Left Arm, Patient Position: Sitting, Cuff Size: Large)   Pulse 83   Temp 98.7 F (37.1 C) (Oral)   Resp 16   Ht 5\' 1"  (1.549 m)   Wt 205 lb (93 kg)   SpO2 98%   BMI 38.73 kg/m   BP Readings from Last 3 Encounters:  01/05/18 124/80  12/18/17 120/70  11/27/17 130/70    Wt Readings from Last 3 Encounters:  01/05/18 205 lb (93 kg)  12/18/17 207 lb 8 oz (94.1 kg)  11/27/17 209 lb (94.8 kg)    Physical Exam  Constitutional: She is oriented to person, place, and time. No distress.  HENT:  Mouth/Throat: Oropharynx is clear and moist. No oropharyngeal exudate.  Eyes: Conjunctivae are normal. Left eye exhibits no discharge. No scleral icterus.  Neck: Normal range of motion. Neck supple. No JVD present. No thyromegaly present.  Cardiovascular: Normal rate, regular rhythm and normal heart sounds. Exam reveals no gallop.  No murmur heard. Pulmonary/Chest: Effort normal and breath sounds normal. No respiratory distress. She has no wheezes. She has no rales.  Abdominal: Soft. Bowel sounds are normal. She exhibits no distension and no mass. There is no tenderness. There is no guarding.  Musculoskeletal: She exhibits edema. She exhibits no tenderness or deformity.  Trace pitting edema in BLE  Lymphadenopathy:    She has no cervical adenopathy.  Neurological: She is alert and oriented to person, place, and time.  Skin: Skin is warm and dry. No rash noted. She is not diaphoretic. No erythema.  Vitals reviewed.   Lab Results  Component Value Date  WBC 10.8 (H) 12/18/2017   HGB 12.3 12/18/2017   HCT 36.8 12/18/2017   PLT 318.0 12/18/2017   GLUCOSE 91 01/05/2018   CHOL 228 (H) 02/27/2017   TRIG 120 02/27/2017   HDL 54 02/27/2017   LDLDIRECT 150.8 11/30/2013   LDLCALC 150 (H) 02/27/2017   ALT 13 01/05/2018   AST 16 01/05/2018   NA 139 01/05/2018   K 3.8 01/05/2018   CL 105 01/05/2018   CREATININE 0.77 01/05/2018   BUN 13  01/05/2018   CO2 30 01/05/2018   TSH 1.11 02/27/2017    No results found.  Assessment & Plan:   Maydelin was seen today for leg swelling.  Diagnoses and all orders for this visit:  Bilateral edema of lower extremity- She agrees to decrease her intake of sodium and to avoid anti-inflammatories like Aleve.  She will also try to lose weight.  Her lab work today is negative for any secondary causes of lower extremity edema.  Her electrolytes are normal.  She will continue taking hydrochlorothiazide to treat this. -     hydrochlorothiazide (HYDRODIURIL) 25 MG tablet; Take 0.5-1 tablets (12.5-25 mg total) by mouth daily as needed. For edema -     Comprehensive metabolic panel; Future -     Urinalysis, Routine w reflex microscopic; Future   I have discontinued Marie Torres Rivaroxaban. I am also having her maintain her hydrochlorothiazide.  Meds ordered this encounter  Medications  . hydrochlorothiazide (HYDRODIURIL) 25 MG tablet    Sig: Take 0.5-1 tablets (12.5-25 mg total) by mouth daily as needed. For edema    Dispense:  90 tablet    Refill:  0     Follow-up: Return in about 3 months (around 04/07/2018).  Scarlette Calico, MD

## 2018-02-25 ENCOUNTER — Encounter: Payer: BLUE CROSS/BLUE SHIELD | Admitting: Obstetrics & Gynecology

## 2018-03-04 ENCOUNTER — Ambulatory Visit: Payer: BLUE CROSS/BLUE SHIELD | Admitting: Obstetrics & Gynecology

## 2018-03-04 ENCOUNTER — Encounter: Payer: Self-pay | Admitting: Obstetrics & Gynecology

## 2018-03-04 VITALS — BP 128/84 | Ht 61.0 in | Wt 203.0 lb

## 2018-03-04 DIAGNOSIS — E6609 Other obesity due to excess calories: Secondary | ICD-10-CM | POA: Diagnosis not present

## 2018-03-04 DIAGNOSIS — Z780 Asymptomatic menopausal state: Secondary | ICD-10-CM

## 2018-03-04 DIAGNOSIS — Z6838 Body mass index (BMI) 38.0-38.9, adult: Secondary | ICD-10-CM | POA: Diagnosis not present

## 2018-03-04 DIAGNOSIS — Z01419 Encounter for gynecological examination (general) (routine) without abnormal findings: Secondary | ICD-10-CM

## 2018-03-04 NOTE — Progress Notes (Signed)
Marie Torres [birth year] [id number]   History:    56 y.o. G1P1L1 Single.  RP:  Established patient presenting for annual gyn exam   HPI: Menopause, well on no HRT.  No PMB.  No pelvic pain.  Normal vaginal secretions.  Not currently sexually active, strict condom use when she was.  Urine and bowel movements normal.  Breasts normal.  Health labs with family physician.  Body mass index 38.36.  Walks regularly.  Past medical history,surgical history, family history and social history were all reviewed and documented in the EPIC chart.  Gynecologic History No LMP recorded. Patient is postmenopausal. Contraception: condoms and post menopausal status Last Pap: 01/2017. Results were: Negative Last mammogram: 2017. Results were: Negative. Bone Density: Normal in 2018 Colonoscopy: 2015  Obstetric History OB History  Gravida Para Term Preterm AB Living  1 1 1     1   SAB TAB Ectopic Multiple Live Births               # Outcome Date GA Lbr Len/2nd Weight Sex Delivery Anes PTL Lv  1 Term      Vag-Spont        ROS: A ROS was performed and pertinent positives and negatives are included in the history.  GENERAL: No fevers or chills. HEENT: No change in vision, no earache, sore throat or sinus congestion. NECK: No pain or stiffness. CARDIOVASCULAR: No chest pain or pressure. No palpitations. PULMONARY: No shortness of breath, cough or wheeze. GASTROINTESTINAL: No abdominal pain, nausea, vomiting or diarrhea, melena or bright red blood per rectum. GENITOURINARY: No urinary frequency, urgency, hesitancy or dysuria. MUSCULOSKELETAL: No joint or muscle pain, no back pain, no recent trauma. DERMATOLOGIC: No rash, no itching, no lesions. ENDOCRINE: No polyuria, polydipsia, no heat or cold intolerance. No recent change in weight. HEMATOLOGICAL: No anemia or easy bruising or bleeding. NEUROLOGIC: No headache, seizures, numbness, tingling or weakness. PSYCHIATRIC: No depression, no loss of interest in normal  activity or change in sleep pattern.     Exam:   BP 128/84   Ht 5\' 1"  (1.549 m)   Wt 203 lb (92.1 kg)   BMI 38.36 kg/m   Body mass index is 38.36 kg/m.  General appearance : Well developed well nourished female. No acute distress HEENT: Eyes: no retinal hemorrhage or exudates,  Neck supple, trachea midline, no carotid bruits, no thyroidmegaly Lungs: Clear to auscultation, no rhonchi or wheezes, or rib retractions  Heart: Regular rate and rhythm, no murmurs or gallops Breast:Examined in sitting and supine position were symmetrical in appearance, no palpable masses or tenderness,  no skin retraction, no nipple inversion, no nipple discharge, no skin discoloration, no axillary or supraclavicular lymphadenopathy Abdomen: no palpable masses or tenderness, no rebound or guarding Extremities: no edema or skin discoloration or tenderness  Pelvic: Vulva: Normal             Vagina: No gross lesions or discharge  Cervix: No gross lesions or discharge  Uterus  AV, normal size, shape and consistency, non-tender and mobile  Adnexa  Without masses or tenderness  Anus: Normal   Assessment/Plan:  57 y.o. female for annual exam   1. Well female exam with routine gynecological exam Normal gynecologic exam today.  Pap test -April 2018, will repeat at 2 to 3 years.  Breast exam normal.  Will schedule screening mammogram now.  Health labs with family physician.  Last colonoscopy 2015.  2. Menopause present Well on no hormone replacement therapy.  No  postmenopausal bleeding.  Last bone density June 2018 was completely normal.  Continue with weightbearing physical activity.  3. Class 2 obesity due to excess calories without serious comorbidity with body mass index (BMI) of 38.0 to 38.9 in adult Recommend increased physical activity with aerobic activities 5 times a week and weightlifting every 2 days.  Low calorie/carb diet such as [employer] recommended.  Princess Bruins MD, 10:28 AM  03/04/2018

## 2018-03-04 NOTE — Patient Instructions (Signed)
  1. Well female exam with routine gynecological exam Normal gynecologic exam today.  Pap test -April 2018, will repeat at 2 to 3 years.  Breast exam normal.  Will schedule screening mammogram now.  Health labs with family physician.  Last colonoscopy 2015.  2. Menopause present Well on no hormone replacement therapy.  No postmenopausal bleeding.  Last bone density June 2018 was completely normal.  Continue with weightbearing physical activity.  3. Class 2 obesity due to excess calories without serious comorbidity with body mass index (BMI) of 38.0 to 38.9 in adult Recommend increased physical activity with aerobic activities 5 times a week and weightlifting every 2 days.  Low calorie/carb diet such as [employer] recommended.  Tashe, it was a pleasure meeting you today!

## 2018-04-02 ENCOUNTER — Other Ambulatory Visit: Payer: Self-pay | Admitting: Internal Medicine

## 2018-04-02 DIAGNOSIS — R600 Localized edema: Secondary | ICD-10-CM

## 2018-04-09 ENCOUNTER — Other Ambulatory Visit: Payer: Self-pay | Admitting: *Deleted

## 2018-04-09 DIAGNOSIS — R600 Localized edema: Secondary | ICD-10-CM

## 2018-04-09 MED ORDER — HYDROCHLOROTHIAZIDE 25 MG PO TABS: 12.5000 mg | ORAL_TABLET | Freq: Every day | ORAL | 0 refills | Status: DC | PRN

## 2018-06-16 ENCOUNTER — Encounter: Payer: Self-pay | Admitting: Internal Medicine

## 2018-06-16 ENCOUNTER — Ambulatory Visit (INDEPENDENT_AMBULATORY_CARE_PROVIDER_SITE_OTHER): Payer: BLUE CROSS/BLUE SHIELD | Admitting: Internal Medicine

## 2018-06-16 DIAGNOSIS — M25561 Pain in right knee: Secondary | ICD-10-CM | POA: Diagnosis not present

## 2018-06-16 MED ORDER — MELOXICAM 15 MG PO TABS: 15.0000 mg | ORAL_TABLET | Freq: Every day | ORAL | 0 refills | Status: DC

## 2018-06-16 NOTE — Progress Notes (Signed)
   Subjective:    Patient ID: Marie Torres, female    DOB: Oct 12, 1961, 57 y.o.   MRN: [id number]  HPI The patient is a 57 YO female coming in for right knee pain. Started yesterday morning when she got up. Is sore in the knee on the kneecap. She denies twisting, falling, increased over or injury. She walks on concrete at her job and thinks this might be related. Worked yesterday without incident. No instability of the knee or falling. Denies redness or rash. Overall stable since onset. Did not take anything for it. She did not go to work today.  Review of Systems  Constitutional: Positive for activity change. Negative for appetite change, chills, fatigue, fever and unexpected weight change.  Respiratory: Negative.   Cardiovascular: Negative.   Gastrointestinal: Negative.   Musculoskeletal: Positive for arthralgias and myalgias. Negative for back pain, gait problem and joint swelling.  Skin: Negative.   Neurological: Negative.       Objective:   Physical Exam  Constitutional: She is oriented to person, place, and time. She appears well-developed and well-nourished.  HENT:  Head: Normocephalic and atraumatic.  Eyes: EOM are normal.  Neck: Normal range of motion.  Cardiovascular: Normal rate and regular rhythm.  Pulmonary/Chest: Effort normal and breath sounds normal. No respiratory distress. She has no wheezes. She has no rales.  Musculoskeletal: She exhibits tenderness. She exhibits no edema.  Pain right knee on the patella with compression, ACL and PCL intact, no pain over the lateral or medial ligament.   Neurological: She is alert and oriented to person, place, and time. Coordination normal.  Skin: Skin is warm and dry.   Vitals:   06/16/18 1555  BP: 120/70  Pulse: 87  Temp: 98.2 F (36.8 C)  TempSrc: Oral  SpO2: 97%  Weight: 200 lb (90.7 kg)  Height: 5\' 1"  (1.549 m)      Assessment & Plan:

## 2018-06-16 NOTE — Patient Instructions (Signed)
We have sent in meloxicam to take 1 pill daily for the next 1-2 weeks for the pain.   If no improvement call us back or come back.

## 2018-06-17 DIAGNOSIS — M25561 Pain in right knee: Secondary | ICD-10-CM | POA: Insufficient documentation

## 2018-06-17 NOTE — Assessment & Plan Note (Signed)
Rx for meloxicam and advised to take for about 1-2 weeks and use ice for the pain. No restrictions on activity.

## 2018-07-12 ENCOUNTER — Other Ambulatory Visit: Payer: Self-pay | Admitting: Internal Medicine

## 2018-07-18 ENCOUNTER — Other Ambulatory Visit: Payer: Self-pay | Admitting: Internal Medicine

## 2018-07-18 DIAGNOSIS — R600 Localized edema: Secondary | ICD-10-CM

## 2018-08-18 ENCOUNTER — Encounter: Payer: Self-pay | Admitting: Internal Medicine

## 2018-08-18 ENCOUNTER — Ambulatory Visit (INDEPENDENT_AMBULATORY_CARE_PROVIDER_SITE_OTHER): Payer: BLUE CROSS/BLUE SHIELD | Admitting: Internal Medicine

## 2018-08-18 DIAGNOSIS — M542 Cervicalgia: Secondary | ICD-10-CM | POA: Diagnosis not present

## 2018-08-18 MED ORDER — DICLOFENAC SODIUM 75 MG PO TBEC: 75.0000 mg | DELAYED_RELEASE_TABLET | Freq: Two times a day (BID) | ORAL | 0 refills | Status: DC

## 2018-08-18 NOTE — Patient Instructions (Signed)
We have sent in voltaren to take 1 pill twice a day for 1-2 weeks. This should cause the pain to go away.

## 2018-08-18 NOTE — Assessment & Plan Note (Signed)
Suspect muscular injury. Rx for voltaren and advised to use heat and tylenol if needed as well. Call back if no relief in 1-2 weeks.

## 2018-08-18 NOTE — Progress Notes (Signed)
   Subjective:    Patient ID: Marie Torres, female    DOB: 04-22-1961, 57 y.o.   MRN: [id number]  HPI The patient is a 57 YO female coming in for left sided neck pain. Started about 2 days ago. Started after a new task at work and she was lifting a lot and head going up and down frequently. She had the pain starting after work. She denies lifting heavy things but boxes up and down. Denies pain going to arms. She denies numbness or weakness. Denies chest pains. Has tried aleve with some relief. When she is not moving there is no pain. When she moves a certain way it can hurt 8/10 pain. Overall is stable since onset. Denies headaches. Denies vision changes.   Review of Systems  Constitutional: Positive for activity change. Negative for appetite change, chills, fatigue, fever and unexpected weight change.  Respiratory: Negative.   Cardiovascular: Negative.   Gastrointestinal: Negative.   Musculoskeletal: Positive for myalgias, neck pain and neck stiffness. Negative for arthralgias, back pain, gait problem and joint swelling.  Skin: Negative.   Neurological: Negative.       Objective:   Physical Exam  Constitutional: She is oriented to person, place, and time. She appears well-developed and well-nourished.  HENT:  Head: Normocephalic and atraumatic.  Eyes: EOM are normal.  Neck: Normal range of motion.  Cardiovascular: Normal rate and regular rhythm.  Pulmonary/Chest: Effort normal and breath sounds normal. No respiratory distress. She has no wheezes. She has no rales.  Musculoskeletal: She exhibits tenderness. She exhibits no edema.  Pain lateral left neck and shoulder as well as left scapular region. No pain midline cervical spine.   Neurological: She is alert and oriented to person, place, and time. Coordination normal.  Skin: Skin is warm and dry.   Vitals:   08/18/18 1457  BP: 130/70  Pulse: 82  Temp: 97.9 F (36.6 C)  TempSrc: Oral  SpO2: 96%  Weight: 206 lb (93.4 kg)    Height: 5\' 1"  (1.549 m)      Assessment & Plan:

## 2018-08-21 ENCOUNTER — Emergency Department (HOSPITAL_COMMUNITY)
Admission: EM | Admit: 2018-08-21 | Discharge: 2018-08-21 | Disposition: A | Discharge status: Home / Self Care | Payer: BLUE CROSS/BLUE SHIELD | Attending: Emergency Medicine | Admitting: Emergency Medicine

## 2018-08-21 ENCOUNTER — Other Ambulatory Visit: Payer: Self-pay

## 2018-08-21 ENCOUNTER — Encounter (HOSPITAL_COMMUNITY): Payer: Self-pay

## 2018-08-21 DIAGNOSIS — Z87891 Personal history of nicotine dependence: Secondary | ICD-10-CM | POA: Diagnosis not present

## 2018-08-21 DIAGNOSIS — R221 Localized swelling, mass and lump, neck: Secondary | ICD-10-CM | POA: Insufficient documentation

## 2018-08-21 DIAGNOSIS — M542 Cervicalgia: Secondary | ICD-10-CM | POA: Diagnosis not present

## 2018-08-21 MED ORDER — METHOCARBAMOL 500 MG PO TABS: 500.0000 mg | ORAL_TABLET | Freq: Three times a day (TID) | ORAL | 0 refills | Status: DC | PRN

## 2018-08-21 NOTE — ED Provider Notes (Signed)
[facility] DEPT Provider Note   CSN: [id number] Arrival date & time: 08/21/18  1832     History   Chief Complaint Chief Complaint  Patient presents with  . neck swelling  . Neck Pain    HPI Marie Torres is a 57 y.o. female with a history of GERD who is status post tubal ligation presenting to the emergency department with complaints of neck pain for the past 1 week.  Patient states that she was at work doing a lot of heavy lifting where she had to bend over which she feels triggered this problem.  She states she initially noted pain to the left side of the neck, she was seen by her primary care provider who gave her prescription for diclofenac which was helping somewhat.  She states however she then developed pain on the right side.  She states pain on the left side is still somewhat present.  Pain is currently a 6 out of 10 in severity, worse with range of motion of the neck, alleviated somewhat with diclofenac this morning.  Denies traumatic injury.  Denies fever, chills, nausea, vomiting, numbness, weakness, or paresthesias.  Denies incontinence of bowel or bladder.  Denies history of cancer or IV drug use.  HPI  Past Medical History:  Diagnosis Date  . Arthritis    right hand  . Chicken pox    Childhood  . Diverticulosis of colon (without mention of hemorrhage)   . GERD (gastroesophageal reflux disease)   . Hiatal hernia   . Hyperplastic colon polyp   . Mass of finger of right hand 02/2013   right middle finger  . Seasonal allergies   . Sinus congestion 03/16/2013   cough, stuffy and runny nose  . Urine incontinence   . Vitamin D deficiency   . Wears partial dentures    upper    Patient Active Problem List   Diagnosis Date Noted  . Neck pain on left side 08/18/2018  . Right knee pain 06/17/2018  . Bilateral edema of lower extremity 05/27/2017  . Routine general medical examination at a health care facility 06/04/2016  . Visit for  screening mammogram 06/04/2016  . Right-sided low back pain with right-sided sciatica 05/23/2016  . Insomnia 08/17/2015  . Rectal incontinence 08/17/2015  . Pain due to interstitial cystitis 09/16/2014  . ESOPHAGITIS, REFLUX 08/14/2007    Past Surgical History:  Procedure Laterality Date  . COLONOSCOPY  08/14/2007   562.10, 211.3  . FOOT SURGERY Bilateral    exc. ingrown toenail great toe  . TUBAL LIGATION  09/02/2002     OB History    Gravida  1   Para  1   Term  1   Preterm      AB      Living  1     SAB      TAB      Ectopic      Multiple      Live Births               Home Medications    Prior to Admission medications   Medication Sig Start Date End Date Taking? Authorizing Provider  diclofenac (VOLTAREN) 75 MG EC tablet Take 1 tablet (75 mg total) by mouth 2 (two) times daily. 08/18/18   Hoyt Koch, MD  hydrochlorothiazide (HYDRODIURIL) 25 MG tablet Take 0.5-1 tablets (12.5-25 mg total) by mouth daily as needed. For edema 04/09/18   Janith Lima, MD  Family History Family History  Problem Relation Age of Onset  . Kidney disease Father   . Heart disease Father   . Stroke Father   . Hypertension Father   . Heart disease Mother   . Depression Mother   . Cancer Sister        lung  . Colon cancer Neg Hx     Social History Social History   Tobacco Use  . Smoking status: Former Research scientist (life sciences)  . Smokeless tobacco: Never Used  . Tobacco comment: quit smoking 20 years ago  Substance Use Topics  . Alcohol use: Yes    Alcohol/week: 0.0 standard drinks    Comment: social   . Drug use: No     Allergies   Shellfish allergy   Review of Systems Review of Systems  Constitutional: Negative for chills and fever.  Eyes: Negative for visual disturbance.  Gastrointestinal: Negative for nausea and vomiting.  Musculoskeletal: Positive for myalgias and neck pain.  Neurological: Negative for dizziness, syncope, weakness, numbness and  headaches.     Physical Exam Updated Vital Signs BP (!) 163/77 (BP Location: Left Arm)   Pulse 78   Temp 98.8 F (37.1 C) (Oral)   Resp 16   Ht 5\' 1"  (1.549 m)   Wt 93.4 kg   SpO2 98%   BMI 38.92 kg/m   Physical Exam  Constitutional: She appears well-developed and well-nourished.  Non-toxic appearance. No distress.  HENT:  Head: Normocephalic and atraumatic.  Eyes: Pupils are equal, round, and reactive to light. Conjunctivae and EOM are normal. Right eye exhibits no discharge. Left eye exhibits no discharge.  Neck: Neck supple. Muscular tenderness (bilateral paraspinal muscles, specifically trapezius) present. No spinous process tenderness present. Carotid bruit is not present. No neck rigidity. No edema and no erythema present.  Patient has full AROM.   Cardiovascular: Normal rate and regular rhythm.  Pulmonary/Chest: Effort normal and breath sounds normal. No respiratory distress. She has no wheezes. She has no rhonchi. She has no rales.  Respiration even and unlabored  Abdominal: Soft. She exhibits no distension. There is no tenderness.  Neurological:  Alert. Clear speech. No facial droop. CNIII-XII grossly intact. Bilateral upper and lower extremities' sensation grossly intact. 5/5 symmetric strength with grip strength and with plantar and dorsi flexion bilaterally. Normal finger to nose bilaterally. Negative pronator drift. Negative Romberg sign. Gait is steady and intact.    Skin: Skin is warm and dry. No rash noted.  Psychiatric: She has a normal mood and affect. Her behavior is normal.  Nursing note and vitals reviewed.    ED Treatments / Results  Labs (all labs ordered are listed, but only abnormal results are displayed) Labs Reviewed - No data to display  EKG None  Radiology No results found.  Procedures Procedures (including critical care time)  Medications Ordered in ED Medications - No data to display   Initial Impression / Assessment and Plan / ED  Course  I have reviewed the triage vital signs and the nursing notes.  Pertinent labs & imaging results that were available during my care of the patient were reviewed by me and considered in my medical decision making (see chart for details).    Patient presents with complaint of neck pain.  Patient is nontoxic appearing, vitals are WNL other than elevated BP- doubt HTN emergency, patient aware of need for recheck. Patient has normal neurologic exam, no midline tenderness to palpation, ambulatory. No red flags. No nuchal rigidity or fever to raise  concern for meningitis. Pain is reproducible with palpation of the paraspinal muscles. Most likely muscle strain versus spasm given exam and hx of lifting movements.  Will have patient continue diclofenac and provide prescription for Robaxin, discussed with patient that they are not to drive or operate heavy machinery while taking Robaxin. Recommended application of heat. I discussed treatment plan, need for PCP follow-up, and return precautions with the patient. Provided opportunity for questions, patient confirmed understanding and is in agreement with plan.   Final Clinical Impressions(s) / ED Diagnoses   Final diagnoses:  Neck pain    ED Discharge Orders         Ordered    methocarbamol (ROBAXIN) 500 MG tablet  Every 8 hours PRN     08/21/18 2134           Amaryllis Dyke, PA-C 08/21/18 2154    Quintella Reichert, MD 08/22/18 [phone]

## 2018-08-21 NOTE — Discharge Instructions (Addendum)
You were seen in the emergency department today for neck pain, we suspect this is a muscle strain or spasm.  Please continue to take the diclofenac prescribed by your primary care provider.  We are also giving a prescription for Robaxin.  - Robaxin is the muscle relaxer I have prescribed, this is meant to help with muscle tightness. Be aware that this medication may make you drowsy therefore the first time you take this it should be at a time you are in an environment where you can rest. Do not drive or operate heavy machinery when taking this medication. Do not drink alcohol or take other sedating medications with this medicine such as narcotics or benzodiazepines.   You make take Tylenol per over the counter dosing with these medications.   We have prescribed you new medication(s) today. Discuss the medications prescribed today with your pharmacist as they can have adverse effects and interactions with your other medicines including over the counter and prescribed medications. Seek medical evaluation if you start to experience new or abnormal symptoms after taking one of these medicines, seek care immediately if you start to experience difficulty breathing, feeling of your throat closing, facial swelling, or rash as these could be indications of a more serious allergic reaction    Follow-up with your primary care provider within 3 days for recheck of your neck pain as well as a recheck of your blood pressure as this was elevated in the ER today.  Return to the ER for any new or worsening symptoms or any other concerns.

## 2018-08-21 NOTE — ED Triage Notes (Signed)
Patient states that she has swelling to the right neck and pain x 2 days. Patient states she has increased pain when she looks upward. Patient also reports that the left side had been swollen as well and had seen her PCP regarding this and was given Diclofenac sod EC 75 mg bid. Patient states the left side is much better but now has the same issue on the right neck.

## 2018-09-10 ENCOUNTER — Other Ambulatory Visit: Payer: Self-pay | Admitting: Internal Medicine

## 2018-10-24 ENCOUNTER — Emergency Department (HOSPITAL_COMMUNITY)
Admission: EM | Admit: 2018-10-24 | Discharge: 2018-10-24 | Disposition: A | Discharge status: Home / Self Care | Payer: BLUE CROSS/BLUE SHIELD | Attending: Emergency Medicine | Admitting: Emergency Medicine

## 2018-10-24 ENCOUNTER — Other Ambulatory Visit: Payer: Self-pay

## 2018-10-24 ENCOUNTER — Encounter (HOSPITAL_COMMUNITY): Payer: Self-pay

## 2018-10-24 DIAGNOSIS — Z87891 Personal history of nicotine dependence: Secondary | ICD-10-CM | POA: Diagnosis not present

## 2018-10-24 DIAGNOSIS — N390 Urinary tract infection, site not specified: Secondary | ICD-10-CM

## 2018-10-24 DIAGNOSIS — R1030 Lower abdominal pain, unspecified: Secondary | ICD-10-CM | POA: Diagnosis present

## 2018-10-24 LAB — COMPREHENSIVE METABOLIC PANEL
ALT: 18 U/L (ref 0–44)
AST: 20 U/L (ref 15–41)
Albumin: 3.8 g/dL (ref 3.5–5.0)
Alkaline Phosphatase: 54 U/L (ref 38–126)
Anion gap: 10 (ref 5–15)
BUN: 13 mg/dL (ref 6–20)
CO2: 22 mmol/L (ref 22–32)
Calcium: 9 mg/dL (ref 8.9–10.3)
Chloride: 109 mmol/L (ref 98–111)
Creatinine, Ser: 0.89 mg/dL (ref 0.44–1.00)
GFR calc Af Amer: >60 mL/min (ref >60)
GFR calc non Af Amer: >60 mL/min (ref >60)
Glucose, Bld: 101 mg/dL — ABNORMAL HIGH (ref 70–99)
Potassium: 4.1 mmol/L (ref 3.5–5.1)
Sodium: 141 mmol/L (ref 135–145)
Total Bilirubin: 0.5 mg/dL (ref 0.3–1.2)
Total Protein: 8 g/dL (ref 6.5–8.1)

## 2018-10-24 LAB — URINALYSIS, ROUTINE W REFLEX MICROSCOPIC
Bilirubin Urine: NEGATIVE
Glucose, UA: NEGATIVE mg/dL
Hgb urine dipstick: NEGATIVE
Ketones, ur: NEGATIVE mg/dL
Nitrite: NEGATIVE
Protein, ur: NEGATIVE mg/dL
Specific Gravity, Urine: 1.015 (ref 1.005–1.030)
pH: 5 (ref 5.0–8.0)

## 2018-10-24 LAB — LIPASE, BLOOD: Lipase: 34 U/L (ref 11–51)

## 2018-10-24 LAB — CBC
HCT: 43.3 % (ref 36.0–46.0)
Hemoglobin: 13.7 g/dL (ref 12.0–15.0)
MCH: 28.7 pg (ref 26.0–34.0)
MCHC: 31.6 g/dL (ref 30.0–36.0)
MCV: 90.8 fL (ref 80.0–100.0)
Platelets: 321 10*3/uL (ref 150–400)
RBC: 4.77 MIL/uL (ref 3.87–5.11)
RDW: 13.1 % (ref 11.5–15.5)
WBC: 7.9 10*3/uL (ref 4.0–10.5)
nRBC: 0 % (ref 0.0–0.2)

## 2018-10-24 MED ORDER — CEPHALEXIN 500 MG PO CAPS
1000.0000 mg | ORAL_CAPSULE | Freq: Once | ORAL | Status: AC
  Administered 2018-10-24: 1000 mg via ORAL
  Filled 2018-10-24: qty 2

## 2018-10-24 MED ORDER — CEPHALEXIN 500 MG PO CAPS: 500.0000 mg | ORAL_CAPSULE | Freq: Three times a day (TID) | ORAL | 0 refills | Status: DC

## 2018-10-24 MED ORDER — HYDROCODONE-ACETAMINOPHEN 5-325 MG PO TABS: 1.0000 | ORAL_TABLET | Freq: Four times a day (QID) | ORAL | 0 refills | Status: DC | PRN

## 2018-10-24 NOTE — ED Triage Notes (Signed)
Patient c/o lower abdominal pain, urinary frequency, urgency, dysuria, and right flank pain x 3 days.

## 2018-10-24 NOTE — ED Notes (Signed)
Pt refused vital recheck

## 2018-10-27 LAB — URINE CULTURE: Culture: 70000 — AB

## 2018-10-28 ENCOUNTER — Telehealth: Payer: Self-pay | Admitting: Emergency Medicine

## 2018-10-28 NOTE — Telephone Encounter (Signed)
Post ED Visit - Positive Culture Follow-up  Culture report reviewed by antimicrobial stewardship pharmacist:  []  Elenor Quinones, Pharm.D. []  Heide Guile, Pharm.D., BCPS AQ-ID []  Parks Neptune, Pharm.D., BCPS []  Alycia Rossetti, Pharm.D., BCPS []  [place], Pharm.D., BCPS, AAHIVP []  Legrand Como, Pharm.D., BCPS, AAHIVP []  Salome Arnt, PharmD, BCPS []  Johnnette Gourd, PharmD, BCPS [x]  Hughes Better, PharmD, BCPS []  Leeroy Cha, PharmD  Positive urine culture Treated with cephalexin, organism sensitive to the same and no further patient follow-up is required at this time.  [name] 10/28/2018, 8:33 AM

## 2018-11-01 NOTE — ED Provider Notes (Signed)
[facility] DEPT Provider Note   CSN: [id number] Arrival date & time: 10/24/18  1512     History   Chief Complaint Chief Complaint  Patient presents with  . Abdominal Pain  . Urinary Frequency  . Dysuria  . Flank Pain    HPI Marie Torres is a 58 y.o. female.  HPI   58 year old female with abdominal pain.  Lower abdomen.  Associated urinary frequency, urgency and dysuria.  Onset of symptoms about 3 days ago.  Persistent since then.  No fevers or chills.  Past Medical History:  Diagnosis Date  . Arthritis    right hand  . Chicken pox    Childhood  . Diverticulosis of colon (without mention of hemorrhage)   . GERD (gastroesophageal reflux disease)   . Hiatal hernia   . Hyperplastic colon polyp   . Mass of finger of right hand 02/2013   right middle finger  . Seasonal allergies   . Sinus congestion 03/16/2013   cough, stuffy and runny nose  . Urine incontinence   . Vitamin D deficiency   . Wears partial dentures    upper    Patient Active Problem List   Diagnosis Date Noted  . Neck pain on left side 08/18/2018  . Right knee pain 06/17/2018  . Bilateral edema of lower extremity 05/27/2017  . Routine general medical examination at a health care facility 06/04/2016  . Visit for screening mammogram 06/04/2016  . Right-sided low back pain with right-sided sciatica 05/23/2016  . Insomnia 08/17/2015  . Rectal incontinence 08/17/2015  . Pain due to interstitial cystitis 09/16/2014  . ESOPHAGITIS, REFLUX 08/14/2007    Past Surgical History:  Procedure Laterality Date  . COLONOSCOPY  08/14/2007   562.10, 211.3  . FOOT SURGERY Bilateral    exc. ingrown toenail great toe  . TUBAL LIGATION  09/02/2002     OB History    Gravida  1   Para  1   Term  1   Preterm      AB      Living  1     SAB      TAB      Ectopic      Multiple      Live Births               Home Medications    Prior to Admission  medications   Medication Sig Start Date End Date Taking? Authorizing Provider  cephALEXin (KEFLEX) 500 MG capsule Take 1 capsule (500 mg total) by mouth 3 (three) times daily. 10/24/18   Virgel Manifold, MD  diclofenac (VOLTAREN) 75 MG EC tablet TAKE 1 TABLET BY MOUTH TWICE A DAY Patient not taking: Reported on 10/24/2018 09/14/18   Janith Lima, MD  hydrochlorothiazide (HYDRODIURIL) 25 MG tablet Take 0.5-1 tablets (12.5-25 mg total) by mouth daily as needed. For edema Patient not taking: Reported on 10/24/2018 04/09/18   Janith Lima, MD  HYDROcodone-acetaminophen (NORCO/VICODIN) 5-325 MG tablet Take 1 tablet by mouth every 6 (six) hours as needed. 10/24/18   Virgel Manifold, MD  methocarbamol (ROBAXIN) 500 MG tablet Take 1 tablet (500 mg total) by mouth every 8 (eight) hours as needed for muscle spasms. Patient not taking: Reported on 10/24/2018 08/21/18   Petrucelli, Glynda Jaeger, PA-C    Family History Family History  Problem Relation Age of Onset  . Kidney disease Father   . Heart disease Father   . Stroke Father   . Hypertension  Father   . Heart disease Mother   . Depression Mother   . Cancer Sister        lung  . Colon cancer Neg Hx     Social History Social History   Tobacco Use  . Smoking status: Former Research scientist (life sciences)  . Smokeless tobacco: Never Used  . Tobacco comment: quit smoking 20 years ago  Substance Use Topics  . Alcohol use: Yes    Alcohol/week: 0.0 standard drinks    Comment: social   . Drug use: No     Allergies   Shellfish allergy   Review of Systems Review of Systems  All systems reviewed and negative, other than as noted in HPI.  Physical Exam Updated Vital Signs BP (!) 166/98   Pulse 87   Temp 99.1 F (37.3 C) (Oral)   Resp 16   Ht 5\' 1"  (1.549 m)   Wt 93 kg   SpO2 96%   BMI 38.73 kg/m   Physical Exam Vitals signs and nursing note reviewed.  Constitutional:      General: She is not in acute distress.    Appearance: She is  well-developed.  HENT:     Head: Normocephalic and atraumatic.  Eyes:     General:        Right eye: No discharge.        Left eye: No discharge.     Conjunctiva/sclera: Conjunctivae normal.  Neck:     Musculoskeletal: Neck supple.  Cardiovascular:     Rate and Rhythm: Normal rate and regular rhythm.     Heart sounds: Normal heart sounds. No murmur. No friction rub. No gallop.   Pulmonary:     Effort: Pulmonary effort is normal. No respiratory distress.     Breath sounds: Normal breath sounds.  Abdominal:     General: There is no distension.     Palpations: Abdomen is soft.     Tenderness: There is no abdominal tenderness.     Comments: Mild tenderness in the right abdomen without rebound or guarding.  No distention.  Musculoskeletal:        General: No tenderness.  Skin:    General: Skin is warm and dry.  Neurological:     Mental Status: She is alert.  Psychiatric:        Behavior: Behavior normal.        Thought Content: Thought content normal.      ED Treatments / Results  Labs (all labs ordered are listed, but only abnormal results are displayed) Labs Reviewed  URINE CULTURE - Abnormal; Notable for the following components:      Result Value   Culture 70,000 COLONIES/mL CITROBACTER KOSERI (*)    Organism ID, Bacteria CITROBACTER KOSERI (*)    All other components within normal limits  URINALYSIS, ROUTINE W REFLEX MICROSCOPIC - Abnormal; Notable for the following components:   APPearance HAZY (*)    Leukocytes, UA SMALL (*)    Bacteria, UA RARE (*)    All other components within normal limits  COMPREHENSIVE METABOLIC PANEL - Abnormal; Notable for the following components:   Glucose, Bld 101 (*)    All other components within normal limits  LIPASE, BLOOD  CBC    EKG None  Radiology No results found.  Procedures Procedures (including critical care time)  Medications Ordered in ED Medications  cephALEXin (KEFLEX) capsule 1,000 mg (1,000 mg Oral Given  10/24/18 2230)     Initial Impression / Assessment and Plan / ED Course  I have reviewed the triage vital signs and the nursing notes.  Pertinent labs & imaging results that were available during my care of the patient were reviewed by me and considered in my medical decision making (see chart for details).     58 year old female with UTI.  I feel she is appropriate for outpatient treatment.  Return precautions discussed.  Final Clinical Impressions(s) / ED Diagnoses   Final diagnoses:  Lower urinary tract infectious disease    ED Discharge Orders         Ordered    cephALEXin (KEFLEX) 500 MG capsule  3 times daily     10/24/18 2222    HYDROcodone-acetaminophen (NORCO/VICODIN) 5-325 MG tablet  Every 6 hours PRN     10/24/18 2222           Virgel Manifold, MD 11/01/18 [phone]

## 2018-11-13 ENCOUNTER — Encounter: Payer: Self-pay | Admitting: Family

## 2018-11-13 ENCOUNTER — Ambulatory Visit (INDEPENDENT_AMBULATORY_CARE_PROVIDER_SITE_OTHER): Payer: BLUE CROSS/BLUE SHIELD | Admitting: Family

## 2018-11-13 VITALS — BP 140/78 | HR 79 | Temp 97.9°F | Ht 61.0 in | Wt 201.0 lb

## 2018-11-13 DIAGNOSIS — J209 Acute bronchitis, unspecified: Secondary | ICD-10-CM

## 2018-11-13 MED ORDER — AZITHROMYCIN 250 MG PO TABS: ORAL_TABLET | ORAL | 0 refills | Status: DC

## 2018-11-13 MED ORDER — BENZONATATE 100 MG PO CAPS: 100.0000 mg | ORAL_CAPSULE | Freq: Three times a day (TID) | ORAL | 0 refills | Status: DC | PRN

## 2018-11-13 NOTE — Progress Notes (Signed)
Marie Torres is a 58 y.o. female with the following history as recorded in [employer]:  Patient Active Problem List   Diagnosis Date Noted  . Neck pain on left side 08/18/2018  . Right knee pain 06/17/2018  . Bilateral edema of lower extremity 05/27/2017  . Routine general medical examination at a health care facility 06/04/2016  . Visit for screening mammogram 06/04/2016  . Right-sided low back pain with right-sided sciatica 05/23/2016  . Insomnia 08/17/2015  . Rectal incontinence 08/17/2015  . Pain due to interstitial cystitis 09/16/2014  . ESOPHAGITIS, REFLUX 08/14/2007    Current Outpatient Medications  Medication Sig Dispense Refill  . azithromycin (ZITHROMAX) 250 MG tablet 2 tabs po qd x 1 day; 1 tablet per day x 4 days; 6 tablet 0  . benzonatate (TESSALON) 100 MG capsule Take 1 capsule (100 mg total) by mouth 3 (three) times daily as needed. 20 capsule 0  . hydrochlorothiazide (HYDRODIURIL) 25 MG tablet Take 0.5-1 tablets (12.5-25 mg total) by mouth daily as needed. For edema (Patient not taking: Reported on 10/24/2018) 90 tablet 0   No current facility-administered medications for this visit.     Allergies: Shellfish allergy  Past Medical History:  Diagnosis Date  . Arthritis    right hand  . Chicken pox    Childhood  . Diverticulosis of colon (without mention of hemorrhage)   . GERD (gastroesophageal reflux disease)   . Hiatal hernia   . Hyperplastic colon polyp   . Mass of finger of right hand 02/2013   right middle finger  . Seasonal allergies   . Sinus congestion 03/16/2013   cough, stuffy and runny nose  . Urine incontinence   . Vitamin D deficiency   . Wears partial dentures    upper    Past Surgical History:  Procedure Laterality Date  . COLONOSCOPY  08/14/2007   562.10, 211.3  . FOOT SURGERY Bilateral    exc. ingrown toenail great toe  . TUBAL LIGATION  09/02/2002    Family History  Problem Relation Age of Onset  . Kidney disease Father   . Heart  disease Father   . Stroke Father   . Hypertension Father   . Heart disease Mother   . Depression Mother   . Cancer Sister        lung  . Colon cancer Neg Hx     Social History   Tobacco Use  . Smoking status: Former Research scientist (life sciences)  . Smokeless tobacco: Never Used  . Tobacco comment: quit smoking 20 years ago  Substance Use Topics  . Alcohol use: Yes    Alcohol/week: 0.0 standard drinks    Comment: social     Subjective:  1 week history of cough/ congestion; started with flu- like symptoms last Friday and now progressed into chest congestion/ "deep cough." Felt like she was running a fever earlier this week; using OTC Coricidin with limited relief.    Objective:  Vitals:   11/13/18 1008  BP: 140/78  Pulse: 79  Temp: 97.9 F (36.6 C)  TempSrc: Oral  SpO2: 96%  Weight: 201 lb 0.6 oz (91.2 kg)  Height: 5\' 1"  (1.549 m)    General: Well developed, well nourished, in no acute distress  Skin : Warm and dry.  Head: Normocephalic and atraumatic  Eyes: Sclera and conjunctiva clear; pupils round and reactive to light; extraocular movements intact  Ears: External normal; canals clear; tympanic membranes normal  Oropharynx: Pink, supple. No suspicious lesions  Neck: Supple without  thyromegaly, adenopathy  Lungs: Respirations unlabored; clear to auscultation bilaterally without wheeze, rales, rhonchi  CVS exam: normal rate and regular rhythm.  Neurologic: Alert and oriented; speech intact; face symmetrical; moves all extremities well; CNII-XII intact without focal deficit   Assessment:  1. Acute bronchitis, unspecified organism     Plan:  Rx for Z-pak #1 take as directed, sample of BREO 100 mg qd x 7-10 days, Tessalon Perles 100 mg tid prn; increase fluids, rest and follow-up worse, no better.   No follow-ups on file.  No orders of the defined types were placed in this encounter.   Requested Prescriptions   Signed Prescriptions Disp Refills  . azithromycin (ZITHROMAX) 250 MG  tablet 6 tablet 0    Sig: 2 tabs po qd x 1 day; 1 tablet per day x 4 days;  . benzonatate (TESSALON) 100 MG capsule 20 capsule 0    Sig: Take 1 capsule (100 mg total) by mouth 3 (three) times daily as needed.

## 2019-01-29 ENCOUNTER — Ambulatory Visit: Payer: Self-pay

## 2019-01-29 NOTE — Telephone Encounter (Signed)
Pt c/o diarrhea and epigastric abdominal pain since Sunday. Pt stated that the pain is nagging pain but mild. Pt stated that she has no fever or noted blood in stool. Pt is tolerating fluids. Pt denies dizziness. Care advice given and pt verbalized understanding. Pt does not have e-mail.  Reason for Disposition . [1] MILD pain (e.g., does not interfere with normal activities) AND [2] pain comes and goes (cramps) AND [3] present > 48 hours  Answer Assessment - Initial Assessment Questions 1. LOCATION: "Where does it hurt?"      Upper abdomen 2. RADIATION: "Does the pain shoot anywhere else?" (e.g., chest, back)     no 3. ONSET: "When did the pain begin?" (e.g., minutes, hours or days ago)      Sunday 4. SUDDEN: "Gradual or sudden onset?"     Sudden  5. PATTERN "Does the pain come and go, or is it constant?"    - If constant: "Is it getting better, staying the same, or worsening?"      (Note: Constant means the pain never goes away completely; most serious pain is constant and it progresses)     - If intermittent: "How long does it last?" "Do you have pain now?"     (Note: Intermittent means the pain goes away completely between bouts)     Comes and goes 6. SEVERITY: "How bad is the pain?"  (e.g., Scale 1-10; mild, moderate, or severe)   - MILD (1-3): doesn't interfere with normal activities, abdomen soft and not tender to touch    - MODERATE (4-7): interferes with normal activities or awakens from sleep, tender to touch    - SEVERE (8-10): excruciating pain, doubled over, unable to do any normal activities      mild 7. RECURRENT SYMPTOM: "Have you ever had this type of abdominal pain before?" If so, ask: "When was the last time?" and "What happened that time?"     no 8. CAUSE: "What do you think is causing the abdominal pain?"     virus 9. RELIEVING/AGGRAVATING FACTORS: "What makes it better or worse?" (e.g., movement, antacids, bowel movement)     Eating food worse- ate potato  10.  OTHER SYMPTOMS: "Has there been any vomiting, diarrhea, constipation, or urine problems?"       Diarrhea (moderate)  11. PREGNANCY: "Is there any chance you are pregnant?" "When was yourn/a last menstrual period?"       n/a  Protocols used: ABDOMINAL PAIN - [facility]

## 2019-01-29 NOTE — Telephone Encounter (Signed)
Routing to dr Ronnald Ramp, no way to do virutal visit---please advise, thanks

## 2019-02-01 ENCOUNTER — Other Ambulatory Visit: Payer: Self-pay

## 2019-02-01 ENCOUNTER — Ambulatory Visit (INDEPENDENT_AMBULATORY_CARE_PROVIDER_SITE_OTHER)
Admission: RE | Admit: 2019-02-01 | Discharge: 2019-02-01 | Disposition: A | Discharge status: Home / Self Care | Payer: BLUE CROSS/BLUE SHIELD | Source: Ambulatory Visit | Attending: Internal Medicine | Admitting: Internal Medicine

## 2019-02-01 ENCOUNTER — Ambulatory Visit (INDEPENDENT_AMBULATORY_CARE_PROVIDER_SITE_OTHER): Payer: BLUE CROSS/BLUE SHIELD | Admitting: Internal Medicine

## 2019-02-01 ENCOUNTER — Encounter: Payer: Self-pay | Admitting: Internal Medicine

## 2019-02-01 ENCOUNTER — Other Ambulatory Visit (INDEPENDENT_AMBULATORY_CARE_PROVIDER_SITE_OTHER): Payer: BLUE CROSS/BLUE SHIELD

## 2019-02-01 VITALS — BP 118/78 | HR 85 | Temp 98.2°F | Resp 16 | Ht 61.0 in | Wt 188.8 lb

## 2019-02-01 DIAGNOSIS — R1084 Generalized abdominal pain: Secondary | ICD-10-CM | POA: Diagnosis not present

## 2019-02-01 DIAGNOSIS — R10817 Generalized abdominal tenderness: Secondary | ICD-10-CM | POA: Insufficient documentation

## 2019-02-01 LAB — COMPREHENSIVE METABOLIC PANEL
ALT: 24 U/L (ref 0–35)
AST: 27 U/L (ref 0–37)
Albumin: 3.6 g/dL (ref 3.5–5.2)
Alkaline Phosphatase: 50 U/L (ref 39–117)
BUN: 14 mg/dL (ref 6–23)
CO2: 25 mEq/L (ref 19–32)
Calcium: 9.5 mg/dL (ref 8.4–10.5)
Chloride: 102 mEq/L (ref 96–112)
Creatinine, Ser: 0.84 mg/dL (ref 0.40–1.20)
GFR: 83.75 mL/min (ref >60.00)
Glucose, Bld: 112 mg/dL — ABNORMAL HIGH (ref 70–99)
Potassium: 3.6 mEq/L (ref 3.5–5.1)
Sodium: 138 mEq/L (ref 135–145)
Total Bilirubin: 0.7 mg/dL (ref 0.2–1.2)
Total Protein: 8 g/dL (ref 6.0–8.3)

## 2019-02-01 LAB — URINALYSIS, ROUTINE W REFLEX MICROSCOPIC
Hgb urine dipstick: NEGATIVE
Ketones, ur: NEGATIVE
Leukocytes,Ua: NEGATIVE
Nitrite: NEGATIVE
Specific Gravity, Urine: >=1.03 — AB (ref 1.000–1.030)
Urine Glucose: NEGATIVE
Urobilinogen, UA: 2 — AB (ref 0.0–1.0)
pH: 5.5 (ref 5.0–8.0)

## 2019-02-01 LAB — CBC WITH DIFFERENTIAL/PLATELET
Basophils Absolute: 0.1 10*3/uL (ref 0.0–0.1)
Basophils Relative: 1.1 % (ref 0.0–3.0)
Eosinophils Absolute: 0.1 10*3/uL (ref 0.0–0.7)
Eosinophils Relative: 0.7 % (ref 0.0–5.0)
HCT: 39 % (ref 36.0–46.0)
Hemoglobin: 13.2 g/dL (ref 12.0–15.0)
Lymphocytes Relative: 17.9 % (ref 12.0–46.0)
Lymphs Abs: 2.1 10*3/uL (ref 0.7–4.0)
MCHC: 33.7 g/dL (ref 30.0–36.0)
MCV: 84.5 fl (ref 78.0–100.0)
Monocytes Absolute: 1.2 10*3/uL — ABNORMAL HIGH (ref 0.1–1.0)
Monocytes Relative: 9.9 % (ref 3.0–12.0)
Neutro Abs: 8.3 10*3/uL — ABNORMAL HIGH (ref 1.4–7.7)
Neutrophils Relative %: 70.4 % (ref 43.0–77.0)
Platelets: 485 10*3/uL — ABNORMAL HIGH (ref 150.0–400.0)
RBC: 4.62 Mil/uL (ref 3.87–5.11)
RDW: 12.6 % (ref 11.5–15.5)
WBC: 11.7 10*3/uL — ABNORMAL HIGH (ref 4.0–10.5)

## 2019-02-01 LAB — AMYLASE: Amylase: 50 U/L (ref 27–131)

## 2019-02-01 LAB — LIPASE: Lipase: 32 U/L (ref 11.0–59.0)

## 2019-02-01 LAB — SEDIMENTATION RATE: Sed Rate: 108 mm/hr — ABNORMAL HIGH (ref 0–30)

## 2019-02-01 MED ORDER — IOHEXOL 300 MG/ML  SOLN
100.0000 mL | Freq: Once | INTRAMUSCULAR | Status: AC | PRN
  Administered 2019-02-01: 15:00:00 100 mL via INTRAVENOUS

## 2019-02-01 NOTE — Telephone Encounter (Signed)
Left voicemail asking patient to call back to let us know how she is feeling----and if needed, can she come in for office visit with dr Ronnald Ramp today---possibly 1:00pm or 1:30pm today is available on his schedule right now----ok to transfer patient to [facility] office for scheduling appt to be seen in our office

## 2019-02-01 NOTE — Progress Notes (Signed)
Subjective:  Patient ID: Marie Torres, female    DOB: 1961-02-24  Age: 58 y.o. MRN: [id number]  CC: Abdominal Pain   HPI Marie Torres presents for concerns about abdominal pain and loss of appetite.  She started feeling ill about a week ago with nausea, vomiting, and watery diarrhea.  She tells me those symptoms have resolved.  Today she has kept down applesauce, ginger ale, and water.  She complains of a nagging aching sensation in her lower quadrants and around her bladder.  She has had a few chills but she denies fever, dysuria, hematuria, or back pain.  She complains of weakness and fatigue but denies dizziness or lightheadedness.  Outpatient Medications Prior to Visit  Medication Sig Dispense Refill   hydrochlorothiazide (HYDRODIURIL) 25 MG tablet Take 0.5-1 tablets (12.5-25 mg total) by mouth daily as needed. For edema 90 tablet 0   azithromycin (ZITHROMAX) 250 MG tablet 2 tabs po qd x 1 day; 1 tablet per day x 4 days; 6 tablet 0   benzonatate (TESSALON) 100 MG capsule Take 1 capsule (100 mg total) by mouth 3 (three) times daily as needed. 20 capsule 0   No facility-administered medications prior to visit.     ROS Review of Systems  Constitutional: Positive for appetite change, chills and fatigue. Negative for fever and unexpected weight change.  HENT: Negative.   Respiratory: Negative for cough, chest tightness, shortness of breath and wheezing.   Cardiovascular: Negative for chest pain, palpitations and leg swelling.  Gastrointestinal: Positive for abdominal pain. Negative for abdominal distention, blood in stool, constipation, diarrhea and vomiting.  Genitourinary: Negative.  Negative for decreased urine volume, difficulty urinating, dysuria, frequency, hematuria and urgency.  Musculoskeletal: Negative.  Negative for myalgias.  Skin: Negative.  Negative for color change and rash.  Neurological: Positive for weakness. Negative for dizziness, syncope, light-headedness and  numbness.  Hematological: Negative for adenopathy. Does not bruise/bleed easily.  Psychiatric/Behavioral: Negative.     Objective:  BP 118/78 (BP Location: Left Arm, Patient Position: Sitting, Cuff Size: Normal)    Pulse 85    Temp 98.2 F (36.8 C) (Oral)    Resp 16    Ht 5\' 1"  (1.549 m)    Wt 188 lb 12 oz (85.6 kg)    SpO2 93%    BMI 35.66 kg/m   BP Readings from Last 3 Encounters:  02/01/19 118/78  11/13/18 140/78  10/24/18 (!) 166/98    Wt Readings from Last 3 Encounters:  02/01/19 188 lb 12 oz (85.6 kg)  11/13/18 201 lb 0.6 oz (91.2 kg)  10/24/18 205 lb (93 kg)    Physical Exam Vitals signs reviewed.  Constitutional:      General: She is not in acute distress.    Appearance: She is obese. She is not ill-appearing, toxic-appearing or diaphoretic.  HENT:     Nose: Nose normal.     Mouth/Throat:     Mouth: Mucous membranes are moist.     Pharynx: No oropharyngeal exudate or posterior oropharyngeal erythema.  Eyes:     General: No scleral icterus.    Conjunctiva/sclera: Conjunctivae normal.  Neck:     Musculoskeletal: Normal range of motion and neck supple.  Cardiovascular:     Rate and Rhythm: Normal rate and regular rhythm.     Heart sounds: Normal heart sounds. No murmur. No friction rub. No gallop.   Pulmonary:     Effort: Pulmonary effort is normal.     Breath sounds: No stridor. No wheezing,  rhonchi or rales.  Abdominal:     General: Abdomen is flat. Bowel sounds are normal.     Palpations: Abdomen is soft.     Tenderness: There is abdominal tenderness in the right lower quadrant, suprapubic area and left lower quadrant. There is no right CVA tenderness, left CVA tenderness, guarding or rebound. Negative signs include Murphy's sign and McBurney's sign.     Hernia: No hernia is present.  Musculoskeletal: Normal range of motion.        General: No swelling.     Right lower leg: No edema.     Left lower leg: No edema.  Skin:    General: Skin is warm and dry.      Coloration: Skin is not pale.  Neurological:     General: No focal deficit present.     Mental Status: She is oriented to person, place, and time.     Lab Results  Component Value Date   WBC 11.7 (H) 02/01/2019   HGB 13.2 02/01/2019   HCT 39.0 02/01/2019   PLT 485.0 (H) 02/01/2019   GLUCOSE 112 (H) 02/01/2019   CHOL 228 (H) 02/27/2017   TRIG 120 02/27/2017   HDL 54 02/27/2017   LDLDIRECT 150.8 11/30/2013   LDLCALC 150 (H) 02/27/2017   ALT 24 02/01/2019   AST 27 02/01/2019   NA 138 02/01/2019   K 3.6 02/01/2019   CL 102 02/01/2019   CREATININE 0.84 02/01/2019   BUN 14 02/01/2019   CO2 25 02/01/2019   TSH 1.11 02/27/2017    No results found.  Assessment & Plan:   Marie Torres was seen today for abdominal pain.  Diagnoses and all orders for this visit:  Generalized abdominal tenderness without rebound tenderness-symptoms started a week ago with what sounded like it was gastroenteritis.  Her symptoms have now localized to lower abdominal pain.  She has an elevated white cell count and an elevated sed rate.  I am concerned she has an acute infectious or inflammatory abdominal process like colitis, diverticulitis, appendicitis, IBC, ischemia, etc. I have therefore ordered a stat CT of the abdomen and pelvis with contrast. -     CBC with Differential/Platelet; Future -     Lipase; Future -     Amylase; Future -     Comprehensive metabolic panel; Future -     Sedimentation rate; Future -     Urinalysis, Routine w reflex microscopic; Future -     CT Abdomen Pelvis W Contrast; Future  Generalized abdominal pain -     CT Abdomen Pelvis W Contrast; Future   I have discontinued Marie Torres's azithromycin and benzonatate. I am also having her maintain her hydrochlorothiazide.  No orders of the defined types were placed in this encounter.    Follow-up: Return if symptoms worsen or fail to improve.  Marie Calico, MD

## 2019-02-01 NOTE — Patient Instructions (Signed)
Abdominal Pain, Adult  Abdominal pain can be caused by many things. Often, abdominal pain is not serious and it gets better with no treatment or by being treated at home. However, sometimes abdominal pain is serious. Your health care provider will do a medical history and a physical exam to try to determine the cause of your abdominal pain.  Follow these instructions at home:   Take over-the-counter and prescription medicines only as told by your health care provider. Do not take a laxative unless told by your health care provider.   Drink enough fluid to keep your urine clear or pale yellow.   Watch your condition for any changes.   Keep all follow-up visits as told by your health care provider. This is important.  Contact a health care provider if:   Your abdominal pain changes or gets worse.   You are not hungry or you lose weight without trying.   You are constipated or have diarrhea for more than 2-3 days.   You have pain when you urinate or have a bowel movement.   Your abdominal pain wakes you up at night.   Your pain gets worse with meals, after eating, or with certain foods.   You are throwing up and cannot keep anything down.   You have a fever.  Get help right away if:   Your pain does not go away as soon as your health care provider told you to expect.   You cannot stop throwing up.   Your pain is only in areas of the abdomen, such as the right side or the left lower portion of the abdomen.   You have bloody or black stools, or stools that look like tar.   You have severe pain, cramping, or bloating in your abdomen.   You have signs of dehydration, such as:  ? Dark urine, very little urine, or no urine.  ? Cracked lips.  ? Dry mouth.  ? Sunken eyes.  ? Sleepiness.  ? Weakness.  This information is not intended to replace advice given to you by your health care provider. Make sure you discuss any questions you have with your health care provider.  Document Released: 07/24/2005 Document  Revised: 05/03/2016 Document Reviewed: 03/27/2016  Elsevier Interactive Patient Education  2019 [employer]

## 2019-02-01 NOTE — Telephone Encounter (Signed)
Will she come in today?

## 2019-03-08 ENCOUNTER — Other Ambulatory Visit: Payer: Self-pay

## 2019-03-09 ENCOUNTER — Encounter: Payer: Self-pay | Admitting: Obstetrics & Gynecology

## 2019-03-09 ENCOUNTER — Ambulatory Visit: Payer: BLUE CROSS/BLUE SHIELD | Admitting: Obstetrics & Gynecology

## 2019-03-09 ENCOUNTER — Other Ambulatory Visit: Payer: Self-pay

## 2019-03-09 VITALS — BP 164/100 | Ht 61.0 in | Wt 194.0 lb

## 2019-03-09 DIAGNOSIS — Z01419 Encounter for gynecological examination (general) (routine) without abnormal findings: Secondary | ICD-10-CM | POA: Diagnosis not present

## 2019-03-09 DIAGNOSIS — Z6836 Body mass index (BMI) 36.0-36.9, adult: Secondary | ICD-10-CM

## 2019-03-09 DIAGNOSIS — E6609 Other obesity due to excess calories: Secondary | ICD-10-CM

## 2019-03-09 DIAGNOSIS — Z780 Asymptomatic menopausal state: Secondary | ICD-10-CM

## 2019-03-09 NOTE — Patient Instructions (Signed)
1. Well female exam with routine gynecological exam Normal gynecologic exam in menopause.  Pap test in April 2018 was negative, no indication to repeat this year.  Breast exam normal.  Will schedule a screening mammogram now.  Colonoscopy in 2015, on a 10-year schedule.  Health labs with family physician.  2. Postmenopause Well on no hormone replacement therapy.  No postmenopausal bleeding.  Last bone density in June 2018 was completely normal, will repeat at 5 years.  Vitamin D supplements, calcium intake of 1200 to 1500 mg daily and regular weightbearing physical activity is recommended.  3. Class 2 obesity due to excess calories without serious comorbidity with body mass index (BMI) of 36.0 to 36.9 in adult Recommend a lower calorie/carb diet such as [employer].  Recommend aerobic physical activities 5 times a week with weightlifting every 2 days.  Other orders - hydrochlorothiazide (HYDRODIURIL) 25 MG tablet; Take 25 mg by mouth daily.  Denene, it was a pleasure seeing you today!

## 2019-03-09 NOTE — Progress Notes (Signed)
Marie Torres 1961-04-21 [id number]   History:    58 y.o. G1P1L1 Single  RP:  Established patient presenting for annual gyn exam   HPI: Postmenopause, well on no HRT.  No PMB.  No pelvic pain.  Abstinent x last yr.  Urine/BMs normal.  Breasts normal.  BMI 36.66.  Poor nutrition.  Not physically active.  Health labs with Fam MD.  Past medical history,surgical history, family history and social history were all reviewed and documented in the EPIC chart.  Gynecologic History No LMP recorded. Patient is postmenopausal. Contraception: abstinence and post menopausal status Last Pap: 01/2017. Results were: Negative Last mammogram: 06/2016. Results were: normal.  Scheduling now. Bone Density: 03/2017 Normal.  Repeat at 3 yrs Colonoscopy: 2015, 10 yr schedule  Obstetric History OB History  Gravida Para Term Preterm AB Living  1 1 1     1   SAB TAB Ectopic Multiple Live Births               # Outcome Date GA Lbr Len/2nd Weight Sex Delivery Anes PTL Lv  1 Term      Vag-Spont        ROS: A ROS was performed and pertinent positives and negatives are included in the history.  GENERAL: No fevers or chills. HEENT: No change in vision, no earache, sore throat or sinus congestion. NECK: No pain or stiffness. CARDIOVASCULAR: No chest pain or pressure. No palpitations. PULMONARY: No shortness of breath, cough or wheeze. GASTROINTESTINAL: No abdominal pain, nausea, vomiting or diarrhea, melena or bright red blood per rectum. GENITOURINARY: No urinary frequency, urgency, hesitancy or dysuria. MUSCULOSKELETAL: No joint or muscle pain, no back pain, no recent trauma. DERMATOLOGIC: No rash, no itching, no lesions. ENDOCRINE: No polyuria, polydipsia, no heat or cold intolerance. No recent change in weight. HEMATOLOGICAL: No anemia or easy bruising or bleeding. NEUROLOGIC: No headache, seizures, numbness, tingling or weakness. PSYCHIATRIC: No depression, no loss of interest in normal activity or change in sleep  pattern.     Exam:   BP (!) 164/100 (BP Location: Right Arm, Patient Position: Sitting, Cuff Size: Normal)   Ht 5\' 1"  (1.549 m)   Wt 194 lb (88 kg)   BMI 36.66 kg/m   Body mass index is 36.66 kg/m.  General appearance : Well developed well nourished female. No acute distress HEENT: Eyes: no retinal hemorrhage or exudates,  Neck supple, trachea midline, no carotid bruits, no thyroidmegaly Lungs: Clear to auscultation, no rhonchi or wheezes, or rib retractions  Heart: Regular rate and rhythm, no murmurs or gallops Breast:Examined in sitting and supine position were symmetrical in appearance, no palpable masses or tenderness,  no skin retraction, no nipple inversion, no nipple discharge, no skin discoloration, no axillary or supraclavicular lymphadenopathy Abdomen: no palpable masses or tenderness, no rebound or guarding Extremities: no edema or skin discoloration or tenderness  Pelvic: Vulva: Normal             Vagina: No gross lesions or discharge  Cervix: No gross lesions or discharge  Uterus  AV, normal size, shape and consistency, non-tender and mobile  Adnexa  Without masses or tenderness  Anus: Normal   Assessment/Plan:  58 y.o. female for annual exam   1. Well female exam with routine gynecological exam Normal gynecologic exam in menopause.  Pap test in April 2018 was negative, no indication to repeat this year.  Breast exam normal.  Will schedule a screening mammogram now.  Colonoscopy in 2015, on a 10-year schedule.  Health labs with family physician.  2. Postmenopause Well on no hormone replacement therapy.  No postmenopausal bleeding.  Last bone density in June 2018 was completely normal, will repeat at 5 years.  Vitamin D supplements, calcium intake of 1200 to 1500 mg daily and regular weightbearing physical activity is recommended.  3. Class 2 obesity due to excess calories without serious comorbidity with body mass index (BMI) of 36.0 to 36.9 in adult Recommend a  lower calorie/carb diet such as [employer].  Recommend aerobic physical activities 5 times a week with weightlifting every 2 days.  Other orders - hydrochlorothiazide (HYDRODIURIL) 25 MG tablet; Take 25 mg by mouth daily.  Princess Bruins MD, 10:27 AM 03/09/2019

## 2019-04-29 ENCOUNTER — Encounter: Payer: Self-pay | Admitting: Internal Medicine

## 2019-04-29 ENCOUNTER — Other Ambulatory Visit: Payer: Self-pay

## 2019-04-29 ENCOUNTER — Other Ambulatory Visit (INDEPENDENT_AMBULATORY_CARE_PROVIDER_SITE_OTHER): Payer: BC Managed Care – PPO

## 2019-04-29 ENCOUNTER — Ambulatory Visit (INDEPENDENT_AMBULATORY_CARE_PROVIDER_SITE_OTHER): Payer: BC Managed Care – PPO | Admitting: Internal Medicine

## 2019-04-29 VITALS — BP 166/94 | HR 75 | Temp 98.1°F | Resp 16 | Ht 61.0 in | Wt 193.8 lb

## 2019-04-29 DIAGNOSIS — Z0000 Encounter for general adult medical examination without abnormal findings: Secondary | ICD-10-CM | POA: Diagnosis not present

## 2019-04-29 DIAGNOSIS — Z1231 Encounter for screening mammogram for malignant neoplasm of breast: Secondary | ICD-10-CM

## 2019-04-29 DIAGNOSIS — I10 Essential (primary) hypertension: Secondary | ICD-10-CM

## 2019-04-29 DIAGNOSIS — R600 Localized edema: Secondary | ICD-10-CM

## 2019-04-29 DIAGNOSIS — F419 Anxiety disorder, unspecified: Secondary | ICD-10-CM

## 2019-04-29 DIAGNOSIS — Z23 Encounter for immunization: Secondary | ICD-10-CM | POA: Diagnosis not present

## 2019-04-29 DIAGNOSIS — D75839 Thrombocytosis, unspecified: Secondary | ICD-10-CM

## 2019-04-29 DIAGNOSIS — R739 Hyperglycemia, unspecified: Secondary | ICD-10-CM | POA: Diagnosis not present

## 2019-04-29 DIAGNOSIS — D473 Essential (hemorrhagic) thrombocythemia: Secondary | ICD-10-CM | POA: Diagnosis not present

## 2019-04-29 DIAGNOSIS — E559 Vitamin D deficiency, unspecified: Secondary | ICD-10-CM

## 2019-04-29 DIAGNOSIS — E785 Hyperlipidemia, unspecified: Secondary | ICD-10-CM

## 2019-04-29 LAB — URINALYSIS, ROUTINE W REFLEX MICROSCOPIC
Bilirubin Urine: NEGATIVE
Hgb urine dipstick: NEGATIVE
Ketones, ur: NEGATIVE
Leukocytes,Ua: NEGATIVE
Nitrite: NEGATIVE
Specific Gravity, Urine: 1.025 (ref 1.000–1.030)
Total Protein, Urine: NEGATIVE
Urine Glucose: NEGATIVE
Urobilinogen, UA: 0.2 (ref 0.0–1.0)
pH: 5.5 (ref 5.0–8.0)

## 2019-04-29 LAB — IBC PANEL
Iron: 109 ug/dL (ref 42–145)
Saturation Ratios: 28.1 % (ref 20.0–50.0)
Transferrin: 277 mg/dL (ref 212.0–360.0)

## 2019-04-29 LAB — LIPID PANEL
Cholesterol: 197 mg/dL (ref 0–200)
HDL: 62.3 mg/dL (ref >39.00)
LDL Cholesterol: 114 mg/dL — ABNORMAL HIGH (ref 0–99)
NonHDL: 135.04
Total CHOL/HDL Ratio: 3
Triglycerides: 104 mg/dL (ref 0.0–149.0)
VLDL: 20.8 mg/dL (ref 0.0–40.0)

## 2019-04-29 LAB — BASIC METABOLIC PANEL
BUN: 15 mg/dL (ref 6–23)
CO2: 26 mEq/L (ref 19–32)
Calcium: 9.2 mg/dL (ref 8.4–10.5)
Chloride: 107 mEq/L (ref 96–112)
Creatinine, Ser: 0.88 mg/dL (ref 0.40–1.20)
GFR: 79.31 mL/min (ref >60.00)
Glucose, Bld: 92 mg/dL (ref 70–99)
Potassium: 4.3 mEq/L (ref 3.5–5.1)
Sodium: 141 mEq/L (ref 135–145)

## 2019-04-29 LAB — CBC WITH DIFFERENTIAL/PLATELET
Basophils Absolute: 0.1 10*3/uL (ref 0.0–0.1)
Basophils Relative: 1.3 % (ref 0.0–3.0)
Eosinophils Absolute: 0.2 10*3/uL (ref 0.0–0.7)
Eosinophils Relative: 2.5 % (ref 0.0–5.0)
HCT: 41.9 % (ref 36.0–46.0)
Hemoglobin: 13.7 g/dL (ref 12.0–15.0)
Lymphocytes Relative: 27.4 % (ref 12.0–46.0)
Lymphs Abs: 2.4 10*3/uL (ref 0.7–4.0)
MCHC: 32.7 g/dL (ref 30.0–36.0)
MCV: 87 fl (ref 78.0–100.0)
Monocytes Absolute: 0.6 10*3/uL (ref 0.1–1.0)
Monocytes Relative: 6.5 % (ref 3.0–12.0)
Neutro Abs: 5.5 10*3/uL (ref 1.4–7.7)
Neutrophils Relative %: 62.3 % (ref 43.0–77.0)
Platelets: 277 10*3/uL (ref 150.0–400.0)
RBC: 4.81 Mil/uL (ref 3.87–5.11)
RDW: 13.1 % (ref 11.5–15.5)
WBC: 8.8 10*3/uL (ref 4.0–10.5)

## 2019-04-29 LAB — TSH: TSH: 0.92 u[IU]/mL (ref 0.35–4.50)

## 2019-04-29 LAB — HEPATIC FUNCTION PANEL
ALT: 10 U/L (ref 0–35)
AST: 14 U/L (ref 0–37)
Albumin: 4 g/dL (ref 3.5–5.2)
Alkaline Phosphatase: 53 U/L (ref 39–117)
Bilirubin, Direct: 0.1 mg/dL (ref 0.0–0.3)
Total Bilirubin: 0.4 mg/dL (ref 0.2–1.2)
Total Protein: 7.7 g/dL (ref 6.0–8.3)

## 2019-04-29 LAB — FERRITIN: Ferritin: 120 ng/mL (ref 10.0–291.0)

## 2019-04-29 LAB — HEMOGLOBIN A1C: Hgb A1c MFr Bld: 5.7 % (ref 4.6–6.5)

## 2019-04-29 LAB — VITAMIN D 25 HYDROXY (VIT D DEFICIENCY, FRACTURES): VITD: 19.37 ng/mL — ABNORMAL LOW (ref 30.00–100.00)

## 2019-04-29 MED ORDER — TRIAMTERENE-HCTZ 37.5-25 MG PO CAPS: 1.0000 | ORAL_CAPSULE | Freq: Every day | ORAL | 0 refills | Status: DC

## 2019-04-29 MED ORDER — ROSUVASTATIN CALCIUM 5 MG PO TABS: 5.0000 mg | ORAL_TABLET | Freq: Every day | ORAL | 1 refills | Status: DC

## 2019-04-29 MED ORDER — CHOLECALCIFEROL 1.25 MG (50000 UT) PO CAPS: 50000.0000 [IU] | ORAL_CAPSULE | ORAL | 1 refills | Status: DC

## 2019-04-29 NOTE — Patient Instructions (Signed)

## 2019-04-29 NOTE — Progress Notes (Signed)
Subjective:  Patient ID: Marie Torres, female    DOB: 08/23/1961  Age: 58 y.o. MRN: [id number]  CC: Annual Exam and Hypertension   HPI Azriel Dancy presents for a CPX.  She has had some dizzy spells after eating salt heavy foods.  She is no longer taking hydrochlorothiazide.  She is not sure why.  She does not monitor her blood pressure.  She denies headache, blurred vision, CP, DOE, or palpitations.  She does have mild edema around her ankles and feet.  Outpatient Medications Prior to Visit  Medication Sig Dispense Refill  . hydrochlorothiazide (HYDRODIURIL) 25 MG tablet Take 25 mg by mouth daily.     No facility-administered medications prior to visit.     ROS Review of Systems  Constitutional: Negative.  Negative for diaphoresis, fatigue and unexpected weight change.  HENT: Negative.   Eyes: Negative.  Negative for visual disturbance.  Respiratory: Negative for cough, chest tightness, shortness of breath and wheezing.   Cardiovascular: Negative for chest pain, palpitations and leg swelling.  Gastrointestinal: Negative for abdominal pain, constipation, diarrhea, nausea and vomiting.  Endocrine: Negative.   Genitourinary: Negative.  Negative for difficulty urinating, dysuria and hematuria.  Musculoskeletal: Negative.  Negative for arthralgias and myalgias.  Skin: Negative.  Negative for color change and pallor.  Neurological: Positive for dizziness. Negative for weakness, light-headedness and headaches.  Hematological: Negative for adenopathy. Does not bruise/bleed easily.  Psychiatric/Behavioral: Negative.     Objective:  BP (!) 166/94 (BP Location: Left Arm, Patient Position: Sitting, Cuff Size: Large)   Pulse 75   Temp 98.1 F (36.7 C) (Oral)   Resp 16   Ht 5\' 1"  (1.549 m)   Wt 193 lb 12 oz (87.9 kg)   SpO2 97%   BMI 36.61 kg/m   BP Readings from Last 3 Encounters:  04/29/19 (!) 166/94  03/09/19 (!) 164/100  02/01/19 118/78    Wt Readings from Last 3  Encounters:  04/29/19 193 lb 12 oz (87.9 kg)  03/09/19 194 lb (88 kg)  02/01/19 188 lb 12 oz (85.6 kg)    Physical Exam Vitals signs reviewed.  Constitutional:      Appearance: She is obese. She is not ill-appearing or diaphoretic.  HENT:     Nose: Nose normal.     Mouth/Throat:     Mouth: Mucous membranes are moist.     Pharynx: No oropharyngeal exudate or posterior oropharyngeal erythema.  Eyes:     General: No scleral icterus.    Conjunctiva/sclera: Conjunctivae normal.  Neck:     Musculoskeletal: Normal range of motion. No neck rigidity or muscular tenderness.  Cardiovascular:     Rate and Rhythm: Normal rate and regular rhythm.     Pulses: Normal pulses.     Heart sounds: Normal heart sounds, S1 normal and S2 normal. No murmur. No gallop.      Comments: EKG ---  Sinus  Rhythm  WITHIN NORMAL LIMITS Pulmonary:     Effort: Pulmonary effort is normal.     Breath sounds: No stridor. No wheezing, rhonchi or rales.  Abdominal:     General: Abdomen is protuberant. Bowel sounds are normal. There is no distension.     Palpations: There is no hepatomegaly, splenomegaly or mass.     Tenderness: There is no abdominal tenderness. There is no guarding.     Hernia: No hernia is present.  Musculoskeletal:        General: No swelling.     Right lower leg: 1+  Edema present.     Left lower leg: 1+ Edema present.  Lymphadenopathy:     Cervical: No cervical adenopathy.  Skin:    General: Skin is warm.     Coloration: Skin is not pale.  Neurological:     General: No focal deficit present.     Mental Status: She is alert and oriented to person, place, and time. Mental status is at baseline.  Psychiatric:        Mood and Affect: Mood normal.        Behavior: Behavior normal.     Lab Results  Component Value Date   WBC 8.8 04/29/2019   HGB 13.7 04/29/2019   HCT 41.9 04/29/2019   PLT 277.0 04/29/2019   GLUCOSE 92 04/29/2019   CHOL 197 04/29/2019   TRIG 104.0 04/29/2019   HDL  62.30 04/29/2019   LDLDIRECT 150.8 11/30/2013   LDLCALC 114 (H) 04/29/2019   ALT 10 04/29/2019   AST 14 04/29/2019   NA 141 04/29/2019   K 4.3 04/29/2019   CL 107 04/29/2019   CREATININE 0.88 04/29/2019   BUN 15 04/29/2019   CO2 26 04/29/2019   TSH 0.92 04/29/2019   HGBA1C 5.7 04/29/2019    Ct Abdomen Pelvis W Contrast  Result Date: 02/01/2019 CLINICAL DATA:  Abdominal tenderness, diarrhea and loss of appetite. Question appendicitis. EXAM: CT ABDOMEN AND PELVIS WITH CONTRAST TECHNIQUE: Multidetector CT imaging of the abdomen and pelvis was performed using the standard protocol following bolus administration of intravenous contrast. CONTRAST:  136mL OMNIPAQUE IOHEXOL 300 MG/ML  SOLN COMPARISON:  05/18/2015 FINDINGS: Lower chest: Peripheral pulmonary scarring/fibrosis pattern. No evidence of consolidation or effusion. Small hiatal hernia. Hepatobiliary: Normal Pancreas: Normal Spleen: Normal Adrenals/Urinary Tract: Adrenal glands are normal. Kidneys are normal. Bladder is normal. Stomach/Bowel: The appendix as well seen as a normal structure. No other abnormal bowel finding, with the exception of the previously noted small hiatal hernia and of ordinary left colon diverticulosis without evidence of diverticulitis. Vascular/Lymphatic: Aortic atherosclerosis. No aneurysm. IVC is normal. No adenopathy. Reproductive: Normal Other: No free fluid or air. Musculoskeletal: Ordinary lumbar degenerative changes. IMPRESSION: No cause of the presenting symptoms is identified. Well seen, normal appearing appendix. No other acute bowel pathology. Small hiatal hernia. Mild left colon diverticulosis without evidence of diverticulitis. Aortic atherosclerosis. Electronically Signed   By: Nelson Chimes M.D.   On: 02/01/2019 15:17    Assessment & Plan:   Rachelanne was seen today for annual exam and hypertension.  Diagnoses and all orders for this visit:  Anxiety  Need for Tdap vaccination -     Tdap vaccine greater  than or equal to 7yo IM  Bilateral edema of lower extremity- See below regarding HTN.  Routine general medical examination at a health care facility- Exam completed, labs reviewed, vaccines reviewed and updated, Pap and colon cancer screening are up-to-date, she is referred for a screening mammogram, patient education material was given. -     Lipid panel; Future -     HIV Antibody (routine testing w rflx); Future  Visit for screening mammogram -     MM DIGITAL SCREENING BILATERAL; Future  Thrombocytosis ([place])- Her platelet count is normal now.  Her iron level is normal.  Will continue to monitor this. -     IBC panel; Future -     CBC with Differential/Platelet; Future -     Ferritin; Future  Hyperglycemia -     Hemoglobin A1c; Future -     Hepatic  function panel; Future  Essential hypertension- Her blood pressure is not adequately well controlled.  Her EKG is negative for LVH or ischemia.  Her labs are negative for secondary causes or endorgan damage.  I will treat the vitamin D deficiency.  In addition to lifestyle modifications I have asked her to start taking the combination of triamterene and hydrochlorothiazide. -     TSH; Future -     Urinalysis, Routine w reflex microscopic; Future -     VITAMIN D 25 Hydroxy (Vit-D Deficiency, Fractures); Future -     Basic metabolic panel; Future -     Hepatic function panel; Future -     EKG 12-Lead -     triamterene-hydrochlorothiazide (DYAZIDE) 37.5-25 MG capsule; Take 1 each (1 capsule total) by mouth daily.  Vitamin D deficiency disease -     Cholecalciferol 1.25 MG (50000 UT) capsule; Take 1 capsule (50,000 Units total) by mouth once a week.  Hyperlipidemia with target LDL less than 100- She has an elevated ASCVD risk score so I have asked her to start taking a statin for CV risk reduction. -     rosuvastatin (CRESTOR) 5 MG tablet; Take 1 tablet (5 mg total) by mouth daily.   I have discontinued Aleesia Henney hydrochlorothiazide.  I am also having her start on triamterene-hydrochlorothiazide, Cholecalciferol, and rosuvastatin.  Meds ordered this encounter  Medications  . triamterene-hydrochlorothiazide (DYAZIDE) 37.5-25 MG capsule    Sig: Take 1 each (1 capsule total) by mouth daily.    Dispense:  90 capsule    Refill:  0  . Cholecalciferol 1.25 MG (50000 UT) capsule    Sig: Take 1 capsule (50,000 Units total) by mouth once a week.    Dispense:  12 capsule    Refill:  1  . rosuvastatin (CRESTOR) 5 MG tablet    Sig: Take 1 tablet (5 mg total) by mouth daily.    Dispense:  90 tablet    Refill:  1     Follow-up: Return in about 2 months (around 06/30/2019).  Scarlette Calico, MD

## 2019-04-30 ENCOUNTER — Telehealth: Payer: Self-pay | Admitting: Internal Medicine

## 2019-04-30 LAB — HIV ANTIBODY (ROUTINE TESTING W REFLEX): HIV 1&2 Ab, 4th Generation: NONREACTIVE

## 2019-04-30 NOTE — Telephone Encounter (Signed)
Pt states she was given triamterene-hydrochlorothiazide (DYAZIDE) 37.5-25 MG capsule yesterday, but she wants to know if she can go back to her old medication because she is not comfortable with the new one.

## 2019-05-03 NOTE — Telephone Encounter (Signed)
Pt contacted and informed rx was sent due to BP at 04/29/2019 appointment.   Pt stated that she was worried to start the medication. I explained that the HCTZ was the same and that there was another medication to help with BP control.   Pt agreed to start her BP medication.

## 2019-07-21 ENCOUNTER — Other Ambulatory Visit: Payer: Self-pay | Admitting: Internal Medicine

## 2019-07-21 DIAGNOSIS — I10 Essential (primary) hypertension: Secondary | ICD-10-CM

## 2019-08-19 ENCOUNTER — Other Ambulatory Visit: Payer: Self-pay | Admitting: Internal Medicine

## 2019-08-19 DIAGNOSIS — E785 Hyperlipidemia, unspecified: Secondary | ICD-10-CM

## 2019-08-19 DIAGNOSIS — I10 Essential (primary) hypertension: Secondary | ICD-10-CM

## 2019-08-19 NOTE — Telephone Encounter (Signed)
Medication Refill - Medication: triamterene-hydrochlorothiazide (DYAZIDE) 37.5-25 MG capsule   Has the patient contacted their pharmacy? Yes.   Pt took her last pill today. Please advise.  (Agent: If no, request that the patient contact the pharmacy for the refill.) (Agent: If yes, when and what did the pharmacy advise?)  Preferred Pharmacy (with phone number or street name):  [id number] - [place], [place]  [id number] [place] [id number]  Phone: [phone] Fax: [phone]  Open 24 hours     Agent: Please be advised that RX refills may take up to 3 business days. We ask that you follow-up with your pharmacy.

## 2019-09-13 ENCOUNTER — Ambulatory Visit: Payer: Self-pay | Admitting: *Deleted

## 2019-09-13 NOTE — Telephone Encounter (Signed)
Patient is calling to report swelling in knee and foot that is new. Patient is rating her pain at 10 and she is limping to walk. Advised patient no appointment at PCP and we can try another office or UC- patient prefers UC.  Reason for Disposition . [1] SEVERE pain (e.g., excruciating, unable to walk) AND [2] not improved after 2 hours of pain medicine  Answer Assessment - Initial Assessment Questions 1. ONSET: "When did the swelling start?" (e.g., minutes, hours, days)     After walking- took sock off and foot was swollen 2. LOCATION: "What part of the leg is swollen?"  "Are both legs swollen or just one leg?"     Right knee and foot 3. SEVERITY: "How bad is the swelling?" (e.g., localized; mild, moderate, severe)  - Localized - small area of swelling localized to one leg  - MILD pedal edema - swelling limited to foot and ankle, pitting edema < 1/4 inch (6 mm) deep, rest and elevation eliminate most or all swelling  - MODERATE edema - swelling of lower leg to knee, pitting edema > 1/4 inch (6 mm) deep, rest and elevation only partially reduce swelling  - SEVERE edema - swelling extends above knee, facial or hand swelling present      mild 4. REDNESS: "Does the swelling look red or infected?"     no 5. PAIN: "Is the swelling painful to touch?" If so, ask: "How painful is it?"   (Scale 1-10; mild, moderate or severe)     10 6. FEVER: "Do you have a fever?" If so, ask: "What is it, how was it measured, and when did it start?"      no 7. CAUSE: "What do you think is causing the leg swelling?"     Prior injury-1 month ago- patient fell- but no swelling/known injury 8. MEDICAL HISTORY: "Do you have a history of heart failure, kidney disease, liver failure, or cancer?"     no 9. RECURRENT SYMPTOM: "Have you had leg swelling before?" If so, ask: "When was the last time?" "What happened that time?"     Yes- patient is on BP medication- it made the swelling go away 10. OTHER SYMPTOMS: "Do you have  any other symptoms?" (e.g., chest pain, difficulty breathing)       no 11. PREGNANCY: "Is there any chance you are pregnant?" "When was your last menstrual period?"       n/a  Protocols used: KNEE PAIN-A-AH, LEG SWELLING AND EDEMA-A-AH

## 2019-10-14 ENCOUNTER — Other Ambulatory Visit: Payer: Self-pay | Admitting: Internal Medicine

## 2019-10-14 DIAGNOSIS — E559 Vitamin D deficiency, unspecified: Secondary | ICD-10-CM

## 2019-10-19 ENCOUNTER — Other Ambulatory Visit: Payer: Self-pay | Admitting: Internal Medicine

## 2019-10-19 DIAGNOSIS — I10 Essential (primary) hypertension: Secondary | ICD-10-CM

## 2019-10-21 ENCOUNTER — Ambulatory Visit (INDEPENDENT_AMBULATORY_CARE_PROVIDER_SITE_OTHER): Payer: BC Managed Care – PPO | Admitting: Internal Medicine

## 2019-10-21 ENCOUNTER — Encounter: Payer: Self-pay | Admitting: Internal Medicine

## 2019-10-21 ENCOUNTER — Other Ambulatory Visit: Payer: Self-pay

## 2019-10-21 VITALS — BP 138/82 | HR 77 | Temp 98.3°F | Resp 16 | Ht 61.0 in | Wt 182.0 lb

## 2019-10-21 DIAGNOSIS — I10 Essential (primary) hypertension: Secondary | ICD-10-CM | POA: Diagnosis not present

## 2019-10-21 DIAGNOSIS — R634 Abnormal weight loss: Secondary | ICD-10-CM

## 2019-10-21 DIAGNOSIS — E559 Vitamin D deficiency, unspecified: Secondary | ICD-10-CM | POA: Diagnosis not present

## 2019-10-21 LAB — BASIC METABOLIC PANEL
BUN: 16 mg/dL (ref 6–23)
CO2: 27 mEq/L (ref 19–32)
Calcium: 9.4 mg/dL (ref 8.4–10.5)
Chloride: 101 mEq/L (ref 96–112)
Creatinine, Ser: 0.98 mg/dL (ref 0.40–1.20)
GFR: 69.93 mL/min (ref >60.00)
Glucose, Bld: 107 mg/dL — ABNORMAL HIGH (ref 70–99)
Potassium: 3.7 mEq/L (ref 3.5–5.1)
Sodium: 138 mEq/L (ref 135–145)

## 2019-10-21 LAB — CBC WITH DIFFERENTIAL/PLATELET
Basophils Absolute: 0.1 10*3/uL (ref 0.0–0.1)
Basophils Relative: 1.1 % (ref 0.0–3.0)
Eosinophils Absolute: 0.2 10*3/uL (ref 0.0–0.7)
Eosinophils Relative: 1.6 % (ref 0.0–5.0)
HCT: 40.9 % (ref 36.0–46.0)
Hemoglobin: 13.4 g/dL (ref 12.0–15.0)
Lymphocytes Relative: 21.8 % (ref 12.0–46.0)
Lymphs Abs: 2 10*3/uL (ref 0.7–4.0)
MCHC: 32.9 g/dL (ref 30.0–36.0)
MCV: 88.3 fl (ref 78.0–100.0)
Monocytes Absolute: 0.6 10*3/uL (ref 0.1–1.0)
Monocytes Relative: 6.4 % (ref 3.0–12.0)
Neutro Abs: 6.4 10*3/uL (ref 1.4–7.7)
Neutrophils Relative %: 69.1 % (ref 43.0–77.0)
Platelets: 244 10*3/uL (ref 150.0–400.0)
RBC: 4.63 Mil/uL (ref 3.87–5.11)
RDW: 12.5 % (ref 11.5–15.5)
WBC: 9.3 10*3/uL (ref 4.0–10.5)

## 2019-10-21 LAB — SEDIMENTATION RATE: Sed Rate: 61 mm/hr — ABNORMAL HIGH (ref 0–30)

## 2019-10-21 LAB — VITAMIN D 25 HYDROXY (VIT D DEFICIENCY, FRACTURES): VITD: 26.11 ng/mL — ABNORMAL LOW (ref 30.00–100.00)

## 2019-10-21 NOTE — Progress Notes (Addendum)
Subjective:  Patient ID: Marie Torres, female    DOB: 06-Jul-1961  Age: 58 y.o. MRN: [id number]  CC: Hypertension  This visit occurred during the SARS-CoV-2 public health emergency.  Safety protocols were in place, including screening questions prior to the visit, additional usage of staff PPE, and extensive cleaning of exam room while observing appropriate contact time as indicated for disinfecting solutions.    HPI Marie Torres presents for f/up - She has felt well recently but is concerned that other people are commenting that she has lost weight.  She notes that her pants feels looser than they used to.  She has improved her diet over the last 6 months .  She otherwise feels well and offers no other complaints.  Outpatient Medications Prior to Visit  Medication Sig Dispense Refill  . Cholecalciferol (VITAMIN D3) 1.25 MG (50000 UT) CAPS TAKE 1 CAPSULE BY MOUTH ONE TIME PER WEEK 12 capsule 0  . rosuvastatin (CRESTOR) 5 MG tablet TAKE 1 TABLET BY MOUTH EVERY DAY 90 tablet 1  . triamterene-hydrochlorothiazide (DYAZIDE) 37.5-25 MG capsule TAKE 1 EACH (1 CAPSULE TOTAL) BY MOUTH DAILY. 90 capsule 0   No facility-administered medications prior to visit.    ROS Review of Systems  Constitutional: Positive for unexpected weight change. Negative for appetite change, chills, diaphoresis and fatigue.  HENT: Negative for trouble swallowing.   Eyes: Negative for visual disturbance.  Respiratory: Negative for cough, chest tightness, shortness of breath and wheezing.   Cardiovascular: Negative for chest pain, palpitations and leg swelling.  Gastrointestinal: Negative for abdominal pain, blood in stool, constipation, diarrhea, nausea and vomiting.  Endocrine: Negative.  Negative for cold intolerance and heat intolerance.  Genitourinary: Negative.  Negative for difficulty urinating, dysuria and hematuria.  Musculoskeletal: Negative.  Negative for arthralgias and myalgias.  Skin: Negative.  Negative  for color change and pallor.  Neurological: Negative.  Negative for dizziness, facial asymmetry and weakness.  Hematological: Negative for adenopathy. Does not bruise/bleed easily.  Psychiatric/Behavioral: Negative.     Objective:  BP 138/82 (BP Location: Right Arm, Patient Position: Sitting, Cuff Size: Large)   Pulse 77   Temp 98.3 F (36.8 C) (Oral)   Resp 16   Ht 5\' 1"  (1.549 m)   Wt 182 lb (82.6 kg)   SpO2 97%   BMI 34.39 kg/m   BP Readings from Last 3 Encounters:  10/21/19 138/82  04/29/19 (!) 166/94  03/09/19 (!) 164/100    Wt Readings from Last 3 Encounters:  10/21/19 182 lb (82.6 kg)  04/29/19 193 lb 12 oz (87.9 kg)  03/09/19 194 lb (88 kg)    Physical Exam Vitals reviewed.  Constitutional:      General: She is not in acute distress.    Appearance: She is obese. She is not ill-appearing, toxic-appearing or diaphoretic.  HENT:     Nose: Nose normal.     Mouth/Throat:     Mouth: Mucous membranes are moist.  Eyes:     General: No scleral icterus.    Conjunctiva/sclera: Conjunctivae normal.  Cardiovascular:     Rate and Rhythm: Normal rate and regular rhythm.     Heart sounds: No murmur.  Pulmonary:     Effort: Pulmonary effort is normal.     Breath sounds: No stridor. No wheezing, rhonchi or rales.  Abdominal:     General: Abdomen is protuberant. Bowel sounds are normal. There is no distension.     Palpations: Abdomen is soft. There is no hepatomegaly or splenomegaly.  Tenderness: There is no abdominal tenderness.  Musculoskeletal:        General: Normal range of motion.     Cervical back: Neck supple.     Right lower leg: No edema.     Left lower leg: No edema.  Lymphadenopathy:     Cervical: No cervical adenopathy.  Skin:    General: Skin is warm and dry.  Neurological:     General: No focal deficit present.     Mental Status: She is alert.  Psychiatric:        Mood and Affect: Mood normal.        Behavior: Behavior normal.     Lab  Results  Component Value Date   WBC 9.3 10/21/2019   HGB 13.4 10/21/2019   HCT 40.9 10/21/2019   PLT 244.0 10/21/2019   GLUCOSE 107 (H) 10/21/2019   CHOL 197 04/29/2019   TRIG 104.0 04/29/2019   HDL 62.30 04/29/2019   LDLDIRECT 150.8 11/30/2013   LDLCALC 114 (H) 04/29/2019   ALT 10 04/29/2019   AST 14 04/29/2019   NA 138 10/21/2019   K 3.7 10/21/2019   CL 101 10/21/2019   CREATININE 0.98 10/21/2019   BUN 16 10/21/2019   CO2 27 10/21/2019   TSH 1.01 10/21/2019   HGBA1C 5.7 04/29/2019    CT Abdomen Pelvis W Contrast  Result Date: 02/01/2019 CLINICAL DATA:  Abdominal tenderness, diarrhea and loss of appetite. Question appendicitis. EXAM: CT ABDOMEN AND PELVIS WITH CONTRAST TECHNIQUE: Multidetector CT imaging of the abdomen and pelvis was performed using the standard protocol following bolus administration of intravenous contrast. CONTRAST:  117mL OMNIPAQUE IOHEXOL 300 MG/ML  SOLN COMPARISON:  05/18/2015 FINDINGS: Lower chest: Peripheral pulmonary scarring/fibrosis pattern. No evidence of consolidation or effusion. Small hiatal hernia. Hepatobiliary: Normal Pancreas: Normal Spleen: Normal Adrenals/Urinary Tract: Adrenal glands are normal. Kidneys are normal. Bladder is normal. Stomach/Bowel: The appendix as well seen as a normal structure. No other abnormal bowel finding, with the exception of the previously noted small hiatal hernia and of ordinary left colon diverticulosis without evidence of diverticulitis. Vascular/Lymphatic: Aortic atherosclerosis. No aneurysm. IVC is normal. No adenopathy. Reproductive: Normal Other: No free fluid or air. Musculoskeletal: Ordinary lumbar degenerative changes. IMPRESSION: No cause of the presenting symptoms is identified. Well seen, normal appearing appendix. No other acute bowel pathology. Small hiatal hernia. Mild left colon diverticulosis without evidence of diverticulitis. Aortic atherosclerosis. Electronically Signed   By: Nelson Chimes M.D.   On:  02/01/2019 15:17    Assessment & Plan:   Marie Torres was seen today for hypertension.  Diagnoses and all orders for this visit:  Essential hypertension- Her blood pressure is adequately well controlled.  Electrolytes and renal function are normal. -     Thyroid Panel With TSH; Future -     Basic metabolic panel -     Thyroid Panel With TSH -     CBC with Differential  Weight loss, unintentional- She has asymptomatic weight loss and has been working on her diet.  The only remarkable finding on her labs is a mild elevation in her sed rate but her sed rate is not as high as it has been previously.  I therefore do not think the elevated sed rate is a significant finding.  I will continue to monitor the sed rate in the future.  For now she does not have any localizing symptoms so I do not think x-rays are indicated. -     Thyroid Panel With TSH; Future -  Thyroid Panel With TSH -     CBC with Differential -     Sedimentation rate  Vitamin D deficiency disease- Her Vit D level is normal now. -     Vitamin D 25 hydroxy   I am having Marie Torres maintain her triamterene-hydrochlorothiazide, rosuvastatin, and Vitamin D3.  No orders of the defined types were placed in this encounter.    Follow-up: Return in about 3 months (around 01/19/2020).  Scarlette Calico, MD

## 2019-10-21 NOTE — Patient Instructions (Signed)

## 2019-10-22 LAB — THYROID PANEL WITH TSH
Free Thyroxine Index: 2.2 (ref 1.4–3.8)
T3 Uptake: 31 % (ref 22–35)
T4, Total: 7.1 ug/dL (ref 5.1–11.9)
TSH: 1.01 mIU/L (ref 0.40–4.50)

## 2019-10-23 ENCOUNTER — Encounter: Payer: Self-pay | Admitting: Internal Medicine

## 2019-10-25 ENCOUNTER — Other Ambulatory Visit: Payer: Self-pay | Admitting: Internal Medicine

## 2019-10-25 DIAGNOSIS — Z1231 Encounter for screening mammogram for malignant neoplasm of breast: Secondary | ICD-10-CM

## 2019-11-22 ENCOUNTER — Other Ambulatory Visit: Payer: Self-pay | Admitting: Internal Medicine

## 2019-11-22 DIAGNOSIS — E559 Vitamin D deficiency, unspecified: Secondary | ICD-10-CM

## 2019-11-29 ENCOUNTER — Ambulatory Visit: Payer: BC Managed Care – PPO

## 2020-04-24 DIAGNOSIS — M7661 Achilles tendinitis, right leg: Secondary | ICD-10-CM | POA: Insufficient documentation

## 2020-04-25 ENCOUNTER — Other Ambulatory Visit: Payer: Self-pay | Admitting: Internal Medicine

## 2020-04-25 DIAGNOSIS — E785 Hyperlipidemia, unspecified: Secondary | ICD-10-CM

## 2020-04-25 DIAGNOSIS — I10 Essential (primary) hypertension: Secondary | ICD-10-CM

## 2020-04-25 NOTE — Telephone Encounter (Signed)
Please refill as per office routine med refill policy (all routine meds refilled for 3 mo or monthly per pt preference up to one year from last visit, then month to month grace period for 3 mo, then further med refills will have to be denied)  

## 2020-05-30 ENCOUNTER — Telehealth: Payer: Self-pay | Admitting: Internal Medicine

## 2020-05-30 NOTE — Telephone Encounter (Signed)
    1.Medication Requested: triamterene-hydrochlorothiazide (DYAZIDE) 37.5-25 MG capsule  2. Pharmacy (Name, Street, City):[id number] [place], [place]  3. On Med List: yes  4. Last Visit with PCP: 10/21/19  5. Next visit date with PCP: n/a   Agent: Please be advised that RX refills may take up to 3 business days. We ask that you follow-up with your pharmacy.

## 2020-05-31 NOTE — Telephone Encounter (Signed)
Pt is past due for an appt.   Refills have been denied.

## 2020-06-01 NOTE — Telephone Encounter (Signed)
Tried to call pt, line rang and then hung up.

## 2020-06-21 ENCOUNTER — Other Ambulatory Visit: Payer: Self-pay | Admitting: Internal Medicine

## 2020-06-21 DIAGNOSIS — I10 Essential (primary) hypertension: Secondary | ICD-10-CM

## 2020-06-25 ENCOUNTER — Other Ambulatory Visit: Payer: Self-pay | Admitting: Internal Medicine

## 2020-06-25 DIAGNOSIS — I10 Essential (primary) hypertension: Secondary | ICD-10-CM

## 2020-06-28 ENCOUNTER — Encounter: Payer: Self-pay | Admitting: Internal Medicine

## 2020-06-28 ENCOUNTER — Ambulatory Visit (INDEPENDENT_AMBULATORY_CARE_PROVIDER_SITE_OTHER): Payer: BC Managed Care – PPO | Admitting: Internal Medicine

## 2020-06-28 ENCOUNTER — Other Ambulatory Visit: Payer: Self-pay

## 2020-06-28 VITALS — BP 144/92 | HR 84 | Temp 98.3°F | Resp 16 | Ht 61.0 in | Wt 178.0 lb

## 2020-06-28 DIAGNOSIS — Z1231 Encounter for screening mammogram for malignant neoplasm of breast: Secondary | ICD-10-CM

## 2020-06-28 DIAGNOSIS — K635 Polyp of colon: Secondary | ICD-10-CM

## 2020-06-28 DIAGNOSIS — Z124 Encounter for screening for malignant neoplasm of cervix: Secondary | ICD-10-CM

## 2020-06-28 DIAGNOSIS — Z0000 Encounter for general adult medical examination without abnormal findings: Secondary | ICD-10-CM

## 2020-06-28 DIAGNOSIS — E785 Hyperlipidemia, unspecified: Secondary | ICD-10-CM

## 2020-06-28 DIAGNOSIS — I10 Essential (primary) hypertension: Secondary | ICD-10-CM

## 2020-06-28 MED ORDER — TRIAMTERENE-HCTZ 37.5-25 MG PO CAPS: 1.0000 | ORAL_CAPSULE | Freq: Every day | ORAL | 1 refills | Status: DC

## 2020-06-28 NOTE — Patient Instructions (Signed)
Health Maintenance, Female Adopting a healthy lifestyle and getting preventive care are important in promoting health and wellness. Ask your health care provider about:  The right schedule for you to have regular tests and exams.  Things you can do on your own to prevent diseases and keep yourself healthy. What should I know about diet, weight, and exercise? Eat a healthy diet   Eat a diet that includes plenty of vegetables, fruits, low-fat dairy products, and lean protein.  Do not eat a lot of foods that are high in solid fats, added sugars, or sodium. Maintain a healthy weight Body mass index (BMI) is used to identify weight problems. It estimates body fat based on height and weight. Your health care provider can help determine your BMI and help you achieve or maintain a healthy weight. Get regular exercise Get regular exercise. This is one of the most important things you can do for your health. Most adults should:  Exercise for at least 150 minutes each week. The exercise should increase your heart rate and make you sweat (moderate-intensity exercise).  Do strengthening exercises at least twice a week. This is in addition to the moderate-intensity exercise.  Spend less time sitting. Even light physical activity can be beneficial. Watch cholesterol and blood lipids Have your blood tested for lipids and cholesterol at 61 years of age, then have this test every 5 years. Have your cholesterol levels checked more often if:  Your lipid or cholesterol levels are high.  You are older than 61 years of age.  You are at high risk for heart disease. What should I know about cancer screening? Depending on your health history and family history, you may need to have cancer screening at various ages. This may include screening for:  Breast cancer.  Cervical cancer.  Colorectal cancer.  Skin cancer.  Lung cancer. What should I know about heart disease, diabetes, and high blood  pressure? Blood pressure and heart disease  High blood pressure causes heart disease and increases the risk of stroke. This is more likely to develop in people who have high blood pressure readings, are of African descent, or are overweight.  Have your blood pressure checked: ? Every 3-5 years if you are 18-39 years of age. ? Every year if you are 40 years old or older. Diabetes Have regular diabetes screenings. This checks your fasting blood sugar level. Have the screening done:  Once every three years after age 40 if you are at a normal weight and have a low risk for diabetes.  More often and at a younger age if you are overweight or have a high risk for diabetes. What should I know about preventing infection? Hepatitis B If you have a higher risk for hepatitis B, you should be screened for this virus. Talk with your health care provider to find out if you are at risk for hepatitis B infection. Hepatitis C Testing is recommended for:  Everyone born from 1945 through 1965.  Anyone with known risk factors for hepatitis C. Sexually transmitted infections (STIs)  Get screened for STIs, including gonorrhea and chlamydia, if: ? You are sexually active and are younger than 61 years of age. ? You are older than 61 years of age and your health care provider tells you that you are at risk for this type of infection. ? Your sexual activity has changed since you were last screened, and you are at increased risk for chlamydia or gonorrhea. Ask your health care provider if   you are at risk.  Ask your health care provider about whether you are at high risk for HIV. Your health care provider may recommend a prescription medicine to help prevent HIV infection. If you choose to take medicine to prevent HIV, you should first get tested for HIV. You should then be tested every 3 months for as long as you are taking the medicine. Pregnancy  If you are about to stop having your period (premenopausal) and  you may become pregnant, seek counseling before you get pregnant.  Take 400 to 800 micrograms (mcg) of folic acid every day if you become pregnant.  Ask for birth control (contraception) if you want to prevent pregnancy. Osteoporosis and menopause Osteoporosis is a disease in which the bones lose minerals and strength with aging. This can result in bone fractures. If you are 65 years old or older, or if you are at risk for osteoporosis and fractures, ask your health care provider if you should:  Be screened for bone loss.  Take a calcium or vitamin D supplement to lower your risk of fractures.  Be given hormone replacement therapy (HRT) to treat symptoms of menopause. Follow these instructions at home: Lifestyle  Do not use any products that contain nicotine or tobacco, such as cigarettes, e-cigarettes, and chewing tobacco. If you need help quitting, ask your health care provider.  Do not use street drugs.  Do not share needles.  Ask your health care provider for help if you need support or information about quitting drugs. Alcohol use  Do not drink alcohol if: ? Your health care provider tells you not to drink. ? You are pregnant, may be pregnant, or are planning to become pregnant.  If you drink alcohol: ? Limit how much you use to 0-1 drink a day. ? Limit intake if you are breastfeeding.  Be aware of how much alcohol is in your drink. In the [place] one drink equals one 12 oz bottle of beer (355 mL), one 5 oz glass of wine (148 mL), or one 1 oz glass of hard liquor (44 mL). General instructions  Schedule regular health, dental, and eye exams.  Stay current with your vaccines.  Tell your health care provider if: ? You often feel depressed. ? You have ever been abused or do not feel safe at home. Summary  Adopting a healthy lifestyle and getting preventive care are important in promoting health and wellness.  Follow your health care provider's instructions about healthy  diet, exercising, and getting tested or screened for diseases.  Follow your health care provider's instructions on monitoring your cholesterol and blood pressure. This information is not intended to replace advice given to you by your health care provider. Make sure you discuss any questions you have with your health care provider. Document Revised: 10/07/2018 Document Reviewed: 10/07/2018 Elsevier Patient Education  2020 [employer]  

## 2020-06-28 NOTE — Progress Notes (Signed)
Subjective:  Patient ID: Marie Torres, female    DOB: 1961-01-22  Age: 59 y.o. MRN: [id number]  CC: Hypertension and Annual Exam  This visit occurred during the SARS-CoV-2 public health emergency.  Safety protocols were in place, including screening questions prior to the visit, additional usage of staff PPE, and extensive cleaning of exam room while observing appropriate contact time as indicated for disinfecting solutions.    HPI Marie Torres presents for a CPX.  She tells me her blood pressure had been well controlled until she ran out of her antihypertensive 4 days ago.  She is active and denies any recent episodes of chest pain, shortness of breath, diaphoresis, dizziness, lightheadedness, edema, or fatigue.  Outpatient Medications Prior to Visit  Medication Sig Dispense Refill  . Cholecalciferol (VITAMIN D3) 1.25 MG (50000 UT) CAPS TAKE 1 CAPSULE BY MOUTH ONE TIME PER WEEK 12 capsule 0  . rosuvastatin (CRESTOR) 5 MG tablet TAKE 1 TABLET BY MOUTH EVERY DAY 90 tablet 1  . triamterene-hydrochlorothiazide (DYAZIDE) 37.5-25 MG capsule TAKE 1 EACH (1 CAPSULE TOTAL) BY MOUTH DAILY. 90 capsule 1   No facility-administered medications prior to visit.    ROS Review of Systems  Constitutional: Negative.  Negative for diaphoresis, fatigue and unexpected weight change.  HENT: Negative.   Eyes: Negative for visual disturbance.  Respiratory: Negative for cough, chest tightness, shortness of breath and wheezing.   Cardiovascular: Negative for chest pain, palpitations and leg swelling.  Gastrointestinal: Negative for abdominal pain, constipation, diarrhea, nausea and vomiting.  Endocrine: Negative.   Genitourinary: Negative.  Negative for difficulty urinating.  Musculoskeletal: Negative for arthralgias and myalgias.  Skin: Negative.  Negative for color change, pallor and rash.  Neurological: Negative.  Negative for dizziness, weakness, light-headedness and headaches.  Hematological:  Negative for adenopathy. Does not bruise/bleed easily.  Psychiatric/Behavioral: Negative.     Objective:  BP (!) 144/92   Pulse 84   Temp 98.3 F (36.8 C) (Oral)   Resp 16   Ht 5\' 1"  (1.549 m)   Wt 178 lb (80.7 kg)   SpO2 97%   BMI 33.63 kg/m   BP Readings from Last 3 Encounters:  06/28/20 (!) 144/92  10/21/19 138/82  04/29/19 (!) 166/94    Wt Readings from Last 3 Encounters:  06/28/20 178 lb (80.7 kg)  10/21/19 182 lb (82.6 kg)  04/29/19 193 lb 12 oz (87.9 kg)    Physical Exam Vitals reviewed.  HENT:     Nose: Nose normal.     Mouth/Throat:     Mouth: Mucous membranes are moist.  Eyes:     General: No scleral icterus.    Conjunctiva/sclera: Conjunctivae normal.  Cardiovascular:     Rate and Rhythm: Normal rate and regular rhythm.     Heart sounds: No murmur heard.   Pulmonary:     Effort: Pulmonary effort is normal.     Breath sounds: No stridor. No wheezing, rhonchi or rales.  Abdominal:     General: Abdomen is protuberant. Bowel sounds are normal. There is no distension.     Palpations: Abdomen is soft. There is no hepatomegaly, splenomegaly or mass.     Tenderness: There is no abdominal tenderness.  Musculoskeletal:        General: Normal range of motion.     Cervical back: Neck supple.  Lymphadenopathy:     Cervical: No cervical adenopathy.  Skin:    General: Skin is warm and dry.     Coloration: Skin is not pale.  Neurological:     General: No focal deficit present.     Mental Status: She is alert and oriented to person, place, and time. Mental status is at baseline.  Psychiatric:        Mood and Affect: Mood normal.        Behavior: Behavior normal.     Lab Results  Component Value Date   WBC 9.8 06/28/2020   HGB 12.9 06/28/2020   HCT 39.2 06/28/2020   PLT 308 06/28/2020   GLUCOSE 93 06/28/2020   CHOL 141 06/28/2020   TRIG 94 06/28/2020   HDL 57 06/28/2020   LDLDIRECT 150.8 11/30/2013   LDLCALC 66 06/28/2020   ALT 10 04/29/2019    AST 14 04/29/2019   NA 142 06/28/2020   K 4.3 06/28/2020   CL 104 06/28/2020   CREATININE 0.96 06/28/2020   BUN 17 06/28/2020   CO2 31 06/28/2020   TSH 1.01 10/21/2019   HGBA1C 5.7 04/29/2019    CT Abdomen Pelvis W Contrast  Result Date: 02/01/2019 CLINICAL DATA:  Abdominal tenderness, diarrhea and loss of appetite. Question appendicitis. EXAM: CT ABDOMEN AND PELVIS WITH CONTRAST TECHNIQUE: Multidetector CT imaging of the abdomen and pelvis was performed using the standard protocol following bolus administration of intravenous contrast. CONTRAST:  128mL OMNIPAQUE IOHEXOL 300 MG/ML  SOLN COMPARISON:  05/18/2015 FINDINGS: Lower chest: Peripheral pulmonary scarring/fibrosis pattern. No evidence of consolidation or effusion. Small hiatal hernia. Hepatobiliary: Normal Pancreas: Normal Spleen: Normal Adrenals/Urinary Tract: Adrenal glands are normal. Kidneys are normal. Bladder is normal. Stomach/Bowel: The appendix as well seen as a normal structure. No other abnormal bowel finding, with the exception of the previously noted small hiatal hernia and of ordinary left colon diverticulosis without evidence of diverticulitis. Vascular/Lymphatic: Aortic atherosclerosis. No aneurysm. IVC is normal. No adenopathy. Reproductive: Normal Other: No free fluid or air. Musculoskeletal: Ordinary lumbar degenerative changes. IMPRESSION: No cause of the presenting symptoms is identified. Well seen, normal appearing appendix. No other acute bowel pathology. Small hiatal hernia. Mild left colon diverticulosis without evidence of diverticulitis. Aortic atherosclerosis. Electronically Signed   By: Nelson Chimes M.D.   On: 02/01/2019 15:17    Assessment & Plan:   Marie Torres was seen today for hypertension and annual exam.  Diagnoses and all orders for this visit:  Hyperlipidemia with target LDL less than 100- She has achieved her LDL goal is doing well on the statin.  Essential hypertension- Her blood pressure is not  adequately well controlled.  Will restart Dyazide and she agrees to improve her lifestyle modifications. -     CBC with Differential/Platelet; Future -     BASIC METABOLIC PANEL WITH GFR; Future -     triamterene-hydrochlorothiazide (DYAZIDE) 37.5-25 MG capsule; Take 1 each (1 capsule total) by mouth daily. -     BASIC METABOLIC PANEL WITH GFR -     CBC with Differential/Platelet  Routine general medical examination at a health care facility- Exam completed, labs reviewed, vaccines reviewed- She deferred on a flu vaccine, she is referred for screening for breast cancer/colon cancer/cervical cancer, patient education material was given. -     Lipid panel; Future -     Lipid panel  Visit for screening mammogram -     MM DIGITAL SCREENING BILATERAL; Future  Cervical cancer screening -     Ambulatory referral to Gynecology   I am having Marie Torres maintain her Vitamin D3, rosuvastatin, and triamterene-hydrochlorothiazide.  Meds ordered this encounter  Medications  . triamterene-hydrochlorothiazide (DYAZIDE) 37.5-25  MG capsule    Sig: Take 1 each (1 capsule total) by mouth daily.    Dispense:  90 capsule    Refill:  1     Follow-up: Return in about 6 months (around 12/26/2020).  Scarlette Calico, MD

## 2020-06-29 ENCOUNTER — Encounter: Payer: Self-pay | Admitting: Obstetrics and Gynecology

## 2020-06-29 DIAGNOSIS — K635 Polyp of colon: Secondary | ICD-10-CM | POA: Insufficient documentation

## 2020-06-29 LAB — CBC WITH DIFFERENTIAL/PLATELET
Absolute Monocytes: 872 cells/uL (ref 200–950)
Basophils Absolute: 29 cells/uL (ref 0–200)
Basophils Relative: 0.3 %
Eosinophils Absolute: 127 cells/uL (ref 15–500)
Eosinophils Relative: 1.3 %
HCT: 39.2 % (ref 35.0–45.0)
Hemoglobin: 12.9 g/dL (ref 11.7–15.5)
Lymphs Abs: 2401 cells/uL (ref 850–3900)
MCH: 28.8 pg (ref 27.0–33.0)
MCHC: 32.9 g/dL (ref 32.0–36.0)
MCV: 87.5 fL (ref 80.0–100.0)
MPV: 10.4 fL (ref 7.5–12.5)
Monocytes Relative: 8.9 %
Neutro Abs: 6370 cells/uL (ref 1500–7800)
Neutrophils Relative %: 65 %
Platelets: 308 10*3/uL (ref 140–400)
RBC: 4.48 10*6/uL (ref 3.80–5.10)
RDW: 11.7 % (ref 11.0–15.0)
Total Lymphocyte: 24.5 %
WBC: 9.8 10*3/uL (ref 3.8–10.8)

## 2020-06-29 LAB — LIPID PANEL
Cholesterol: 141 mg/dL (ref <200)
HDL: 57 mg/dL (ref >=50)
LDL Cholesterol (Calc): 66 mg/dL (calc)
Non-HDL Cholesterol (Calc): 84 mg/dL (calc) (ref <130)
Total CHOL/HDL Ratio: 2.5 (calc) (ref <5.0)
Triglycerides: 94 mg/dL (ref <150)

## 2020-06-29 LAB — BASIC METABOLIC PANEL WITH GFR
BUN: 17 mg/dL (ref 7–25)
CO2: 31 mmol/L (ref 20–32)
Calcium: 9.3 mg/dL (ref 8.6–10.4)
Chloride: 104 mmol/L (ref 98–110)
Creat: 0.96 mg/dL (ref 0.50–0.99)
GFR, Est African American: 74 mL/min/{1.73_m2} (ref >=60)
GFR, Est Non African American: 64 mL/min/{1.73_m2} (ref >=60)
Glucose, Bld: 93 mg/dL (ref 65–99)
Potassium: 4.3 mmol/L (ref 3.5–5.3)
Sodium: 142 mmol/L (ref 135–146)

## 2020-06-30 ENCOUNTER — Encounter: Payer: Self-pay | Admitting: Internal Medicine

## 2020-08-07 ENCOUNTER — Ambulatory Visit: Payer: BC Managed Care – PPO

## 2020-08-30 ENCOUNTER — Ambulatory Visit
Admission: RE | Admit: 2020-08-30 | Discharge: 2020-08-30 | Disposition: A | Discharge status: Home / Self Care | Payer: BC Managed Care – PPO | Source: Ambulatory Visit | Attending: Internal Medicine | Admitting: Internal Medicine

## 2020-08-30 ENCOUNTER — Other Ambulatory Visit: Payer: Self-pay

## 2020-08-30 DIAGNOSIS — Z1231 Encounter for screening mammogram for malignant neoplasm of breast: Secondary | ICD-10-CM

## 2020-09-02 LAB — HM MAMMOGRAPHY

## 2020-09-20 ENCOUNTER — Other Ambulatory Visit: Payer: Self-pay | Admitting: Internal Medicine

## 2020-09-20 DIAGNOSIS — E785 Hyperlipidemia, unspecified: Secondary | ICD-10-CM

## 2020-11-01 ENCOUNTER — Encounter: Payer: Self-pay | Admitting: Internal Medicine

## 2020-11-01 ENCOUNTER — Other Ambulatory Visit: Payer: Self-pay

## 2020-11-01 ENCOUNTER — Encounter: Payer: BC Managed Care – PPO | Admitting: Internal Medicine

## 2020-11-01 ENCOUNTER — Ambulatory Visit (INDEPENDENT_AMBULATORY_CARE_PROVIDER_SITE_OTHER): Payer: BC Managed Care – PPO | Admitting: Internal Medicine

## 2020-11-01 VITALS — BP 138/76 | HR 87 | Temp 98.5°F | Resp 16 | Ht 61.0 in | Wt 172.0 lb

## 2020-11-01 DIAGNOSIS — I10 Essential (primary) hypertension: Secondary | ICD-10-CM | POA: Diagnosis not present

## 2020-11-01 DIAGNOSIS — N1831 Chronic kidney disease, stage 3a: Secondary | ICD-10-CM | POA: Diagnosis not present

## 2020-11-01 LAB — HEPATIC FUNCTION PANEL
ALT: 14 U/L (ref 0–35)
AST: 18 U/L (ref 0–37)
Albumin: 4.2 g/dL (ref 3.5–5.2)
Alkaline Phosphatase: 47 U/L (ref 39–117)
Bilirubin, Direct: 0.1 mg/dL (ref 0.0–0.3)
Total Bilirubin: 0.3 mg/dL (ref 0.2–1.2)
Total Protein: 7.6 g/dL (ref 6.0–8.3)

## 2020-11-01 LAB — BASIC METABOLIC PANEL
BUN: 14 mg/dL (ref 6–23)
CO2: 30 mEq/L (ref 19–32)
Calcium: 9.8 mg/dL (ref 8.4–10.5)
Chloride: 101 mEq/L (ref 96–112)
Creatinine, Ser: 1.06 mg/dL (ref 0.40–1.20)
GFR: 56.7 mL/min — ABNORMAL LOW (ref >60.00)
Glucose, Bld: 95 mg/dL (ref 70–99)
Potassium: 3.5 mEq/L (ref 3.5–5.1)
Sodium: 138 mEq/L (ref 135–145)

## 2020-11-01 NOTE — Progress Notes (Signed)
Subjective:  Patient ID: Marie Torres, female    DOB: [birth year]  Age: 60 y.o. MRN: [id number]  CC: Hypertension  This visit occurred during the SARS-CoV-2 public health emergency.  Safety protocols were in place, including screening questions prior to the visit, additional usage of staff PPE, and extensive cleaning of exam room while observing appropriate contact time as indicated for disinfecting solutions.    HPI Marie Torres presents for f/up - She tells me her blood pressure has been well controlled.  She denies headache, blurred vision, dizziness, lightheadedness, chest pain, shortness of breath, edema, or fatigue.  Outpatient Medications Prior to Visit  Medication Sig Dispense Refill   Cholecalciferol (VITAMIN D3) 1.25 MG (50000 UT) CAPS TAKE 1 CAPSULE BY MOUTH ONE TIME PER WEEK 12 capsule 0   rosuvastatin (CRESTOR) 5 MG tablet TAKE 1 TABLET BY MOUTH EVERY DAY 90 tablet 1   triamterene-hydrochlorothiazide (DYAZIDE) 37.5-25 MG capsule Take 1 each (1 capsule total) by mouth daily. 90 capsule 1   No facility-administered medications prior to visit.    ROS Review of Systems  Constitutional: Negative for diaphoresis, fatigue and unexpected weight change.  HENT: Negative.   Eyes: Negative for visual disturbance.  Respiratory: Negative for cough, chest tightness, shortness of breath and wheezing.   Cardiovascular: Negative for chest pain, palpitations and leg swelling.  Gastrointestinal: Negative for abdominal pain, diarrhea, nausea and vomiting.  Endocrine: Negative.   Genitourinary: Negative.  Negative for difficulty urinating.  Musculoskeletal: Negative for arthralgias and back pain.  Skin: Negative.  Negative for color change, pallor and rash.  Neurological: Negative for dizziness, weakness, light-headedness and headaches.  Hematological: Negative for adenopathy. Does not bruise/bleed easily.  Psychiatric/Behavioral: Negative.     Objective:  BP 138/76    Pulse 87     Temp 98.5 F (36.9 C) (Oral)    Resp 16    Ht 5\' 1"  (1.549 m)    Wt 172 lb (78 kg)    SpO2 95%    BMI 32.50 kg/m   BP Readings from Last 3 Encounters:  11/01/20 138/76  06/28/20 (!) 144/92  10/21/19 138/82    Wt Readings from Last 3 Encounters:  11/01/20 172 lb (78 kg)  06/28/20 178 lb (80.7 kg)  10/21/19 182 lb (82.6 kg)    Physical Exam Vitals reviewed.  HENT:     Nose: Nose normal.     Mouth/Throat:     Mouth: Mucous membranes are moist.  Eyes:     General: No scleral icterus.    Conjunctiva/sclera: Conjunctivae normal.  Cardiovascular:     Rate and Rhythm: Normal rate and regular rhythm.     Heart sounds: No murmur heard.   Pulmonary:     Effort: Pulmonary effort is normal.     Breath sounds: No stridor. No wheezing, rhonchi or rales.  Abdominal:     General: Abdomen is protuberant. Bowel sounds are normal. There is no distension.     Palpations: Abdomen is soft. There is no hepatomegaly, splenomegaly or mass.     Tenderness: There is no abdominal tenderness.  Musculoskeletal:        General: Normal range of motion.     Cervical back: Neck supple.     Right lower leg: No edema.     Left lower leg: No edema.  Lymphadenopathy:     Cervical: No cervical adenopathy.  Skin:    General: Skin is warm and dry.     Coloration: Skin is not pale.  Neurological:  General: No focal deficit present.  Psychiatric:        Mood and Affect: Mood normal.        Behavior: Behavior normal.     Lab Results  Component Value Date   WBC 9.8 06/28/2020   HGB 12.9 06/28/2020   HCT 39.2 06/28/2020   PLT 308 06/28/2020   GLUCOSE 95 11/01/2020   CHOL 141 06/28/2020   TRIG 94 06/28/2020   HDL 57 06/28/2020   LDLDIRECT 150.8 11/30/2013   LDLCALC 66 06/28/2020   ALT 14 11/01/2020   AST 18 11/01/2020   NA 138 11/01/2020   K 3.5 11/01/2020   CL 101 11/01/2020   CREATININE 1.06 11/01/2020   BUN 14 11/01/2020   CO2 30 11/01/2020   TSH 1.01 10/21/2019   HGBA1C 5.7  04/29/2019    MM DIGITAL SCREENING BILATERAL  Result Date: 09/02/2020 CLINICAL DATA:  Screening. EXAM: DIGITAL SCREENING BILATERAL MAMMOGRAM WITH CAD COMPARISON:  Previous exam(s). ACR Breast Density Category b: There are scattered areas of fibroglandular density. FINDINGS: There are no findings suspicious for malignancy. Images were processed with CAD. IMPRESSION: No mammographic evidence of malignancy. A result letter of this screening mammogram will be mailed directly to the patient. RECOMMENDATION: Screening mammogram in one year. (Code:SM-B-01Y) BI-RADS CATEGORY  1: Negative. Electronically Signed   By: Gerome Sam III M.D   On: 09/02/2020 10:34    Assessment & Plan:   Marie Torres was seen today for hypertension.  Diagnoses and all orders for this visit:  Essential hypertension- Her blood pressure is adequately well controlled.  Electrolytes are normal.  She has had a slight decline in her renal function. -     Hepatic function panel; Future -     Basic metabolic panel; Future -     Basic metabolic panel -     Hepatic function panel  Stage 3a chronic kidney disease (HCC)- I have asked her to avoid nephrotoxic agents and to continue to control her blood pressure.   I am having Marie Torres maintain her Vitamin D3, triamterene-hydrochlorothiazide, and rosuvastatin.  No orders of the defined types were placed in this encounter.    Follow-up: Return in about 6 months (around 05/01/2021).  Sanda Linger, MD

## 2020-11-01 NOTE — Patient Instructions (Signed)

## 2020-11-02 ENCOUNTER — Ambulatory Visit: Payer: BC Managed Care – PPO | Admitting: Obstetrics and Gynecology

## 2020-11-02 DIAGNOSIS — N1831 Chronic kidney disease, stage 3a: Secondary | ICD-10-CM | POA: Insufficient documentation

## 2020-11-05 ENCOUNTER — Encounter: Payer: Self-pay | Admitting: Internal Medicine

## 2020-11-15 ENCOUNTER — Encounter: Payer: Self-pay | Admitting: Podiatry

## 2020-11-15 ENCOUNTER — Ambulatory Visit (INDEPENDENT_AMBULATORY_CARE_PROVIDER_SITE_OTHER): Payer: BC Managed Care – PPO

## 2020-11-15 ENCOUNTER — Ambulatory Visit: Payer: BC Managed Care – PPO | Admitting: Podiatry

## 2020-11-15 ENCOUNTER — Other Ambulatory Visit: Payer: Self-pay

## 2020-11-15 DIAGNOSIS — M7751 Other enthesopathy of right foot: Secondary | ICD-10-CM | POA: Diagnosis not present

## 2020-11-15 DIAGNOSIS — M10071 Idiopathic gout, right ankle and foot: Secondary | ICD-10-CM

## 2020-11-15 DIAGNOSIS — M79671 Pain in right foot: Secondary | ICD-10-CM

## 2020-11-15 MED ORDER — METHYLPREDNISOLONE 4 MG PO TBPK: ORAL_TABLET | ORAL | 0 refills | Status: DC

## 2020-11-16 ENCOUNTER — Encounter: Payer: Self-pay | Admitting: Podiatry

## 2020-11-16 NOTE — Progress Notes (Signed)
Subjective:  Patient ID: Marie Torres, female    DOB: [birth year],  MRN: [id number]  Chief Complaint  Patient presents with  . Foot Pain    Generalized foot pain in the right foot. PT stated that she has a lot of swelling she is here for a second opinion, the first DR told her that she has gout.     60 y.o. female presents with the above complaint.  Patient presents with the diagnosis of gout to the right first MPJ joint.  Patient states that there is swelling a lot of pain associated with it.  She is constantly on still toe boots which may be aggravated rating the pain.  She has a high intake of seafood but not red meat diet.  She also does drink alcohol.  She denies any other acute complaints.  She has not seen any of the foot doctor prior to seeing me.  She was diagnosed by her PCP with gout.  She would like to discuss treatment options she was placed on colchicine which did not help and her PCP decided to take her off as it interferes with her some of her other medication.  She denies any other acute complaints   Review of Systems: Negative except as noted in the HPI. Denies N/V/F/Ch.  Past Medical History:  Diagnosis Date  . Arthritis    right hand  . Chicken pox    Childhood  . Diverticulosis of colon (without mention of hemorrhage)   . GERD (gastroesophageal reflux disease)   . Hiatal hernia   . Hyperplastic colon polyp   . Mass of finger of right hand 02/2013   right middle finger  . Seasonal allergies   . Sinus congestion 03/16/2013   cough, stuffy and runny nose  . Urine incontinence   . Vitamin D deficiency   . Wears partial dentures    upper    Current Outpatient Medications:  .  methylPREDNISolone (MEDROL DOSEPAK) 4 MG TBPK tablet, Take as directed, Disp: 21 each, Rfl: 0 .  Cholecalciferol (VITAMIN D3) 1.25 MG (50000 UT) CAPS, TAKE 1 CAPSULE BY MOUTH ONE TIME PER WEEK, Disp: 12 capsule, Rfl: 0 .  colchicine 0.6 MG tablet, colchicine 0.6 mg tablet, Disp: , Rfl:  .   rosuvastatin (CRESTOR) 5 MG tablet, TAKE 1 TABLET BY MOUTH EVERY DAY, Disp: 90 tablet, Rfl: 1 .  triamterene-hydrochlorothiazide (DYAZIDE) 37.5-25 MG capsule, Take 1 each (1 capsule total) by mouth daily., Disp: 90 capsule, Rfl: 1  Social History   Tobacco Use  Smoking Status Former Smoker  Smokeless Tobacco Never Used  Tobacco Comment   quit smoking 20 years ago    Allergies  Allergen Reactions  . Shellfish Allergy Hives   Objective:  There were no vitals filed for this visit. There is no height or weight on file to calculate BMI. Constitutional Well developed. Well nourished.  Vascular Dorsalis pedis pulses palpable bilaterally. Posterior tibial pulses palpable bilaterally. Capillary refill normal to all digits.  No cyanosis or clubbing noted. Pedal hair growth normal.  Neurologic Normal speech. Oriented to person, place, and time. Epicritic sensation to light touch grossly present bilaterally.  Dermatologic Nails well groomed and normal in appearance. No open wounds. No skin lesions.  Orthopedic:  Pain on palpation to the right first MPJ.  Pain with range of motion of the first MPJ joint deep intra-articular pain noted.  Edema noted circumferentially in the first MPJ joint.  There are some redness present as well.   Radiographs:  3 views of skeletally mature adult foot: No fractures noted.  No bony changes secondary to gout noted.  No tophi is noted.  No osteophytes noted. Assessment:   1. Acute idiopathic gout involving toe of right foot   2. Capsulitis of metatarsophalangeal (MTP) joint of right foot    Plan:  Patient was evaluated and treated and all questions answered.  Right first MPJ gout flare -I explained to the patient the etiology of gout flare and various treatment options were extensively discussed.  Given that she is having acute flareup and unable to tolerate colchicine.  I believe patient will benefit from Medrol Dosepak to decrease inflammation as well as  pain as well as a steroid injection to help locally decrease some of the flareup.  Patient agrees with the plan would like to proceed with a steroid injection. -A steroid injection was performed at right first MPJ joint using 1% plain Lidocaine and 10 mg of Kenalog. This was well tolerated. -If there is no improvement we will discuss obtaining further labs as well as possibly rheumatology follow-up for long-term allopurinol.   No follow-ups on file.

## 2020-11-23 ENCOUNTER — Telehealth: Payer: Self-pay | Admitting: *Deleted

## 2020-11-23 MED ORDER — MELOXICAM 15 MG PO TABS: 15.0000 mg | ORAL_TABLET | Freq: Every day | ORAL | 0 refills | Status: DC

## 2020-11-23 NOTE — Telephone Encounter (Signed)
I Sent mobic

## 2020-11-23 NOTE — Telephone Encounter (Signed)
Patient is requesting something for pain,her foot is still hurting and can barely walk on it.Please advise.

## 2020-11-23 NOTE — Telephone Encounter (Signed)
Informed patient thru VM that prescription (Mobic -15mg  ) sent to pharmacy on file.

## 2020-11-24 ENCOUNTER — Telehealth: Payer: Self-pay | Admitting: *Deleted

## 2020-11-24 NOTE — Telephone Encounter (Signed)
Patient is calling saying that the prescribed anti inflammatory medicine(Mobic) is messing w/ stomach. Is there anything else she may take. Please advise.

## 2020-11-26 NOTE — Telephone Encounter (Signed)
She can take ibuprofen 800mg 

## 2020-11-27 NOTE — Telephone Encounter (Signed)
Called and left VM that she may take ibuprofen 800mg  , if any questions call back.

## 2020-11-28 ENCOUNTER — Telehealth: Payer: Self-pay | Admitting: Podiatry

## 2020-11-28 NOTE — Telephone Encounter (Signed)
Patient has requested 800 mg ibuprofen, the meds that were previously prescribed has not been able to tolerate for various reasons, Please Advise

## 2020-11-29 MED ORDER — IBUPROFEN 800 MG PO TABS: 800.0000 mg | ORAL_TABLET | Freq: Four times a day (QID) | ORAL | 1 refills | Status: DC | PRN

## 2020-11-29 NOTE — Telephone Encounter (Signed)
done

## 2020-11-29 NOTE — Addendum Note (Signed)
Addended by: Boneta Lucks on: 11/29/2020 07:24 AM   Modules accepted: Orders

## 2020-12-12 ENCOUNTER — Other Ambulatory Visit (INDEPENDENT_AMBULATORY_CARE_PROVIDER_SITE_OTHER): Payer: BC Managed Care – PPO

## 2020-12-12 ENCOUNTER — Other Ambulatory Visit: Payer: Self-pay

## 2020-12-12 ENCOUNTER — Telehealth (INDEPENDENT_AMBULATORY_CARE_PROVIDER_SITE_OTHER): Payer: BC Managed Care – PPO | Admitting: Family

## 2020-12-12 DIAGNOSIS — R197 Diarrhea, unspecified: Secondary | ICD-10-CM

## 2020-12-12 LAB — CBC WITH DIFFERENTIAL/PLATELET
Basophils Absolute: 0.1 10*3/uL (ref 0.0–0.1)
Basophils Relative: 0.8 % (ref 0.0–3.0)
Eosinophils Absolute: 0.2 10*3/uL (ref 0.0–0.7)
Eosinophils Relative: 2 % (ref 0.0–5.0)
HCT: 36.5 % (ref 36.0–46.0)
Hemoglobin: 12.2 g/dL (ref 12.0–15.0)
Lymphocytes Relative: 28.3 % (ref 12.0–46.0)
Lymphs Abs: 2.4 10*3/uL (ref 0.7–4.0)
MCHC: 33.3 g/dL (ref 30.0–36.0)
MCV: 84.8 fl (ref 78.0–100.0)
Monocytes Absolute: 0.5 10*3/uL (ref 0.1–1.0)
Monocytes Relative: 6.3 % (ref 3.0–12.0)
Neutro Abs: 5.2 10*3/uL (ref 1.4–7.7)
Neutrophils Relative %: 62.6 % (ref 43.0–77.0)
Platelets: 360 10*3/uL (ref 150.0–400.0)
RBC: 4.31 Mil/uL (ref 3.87–5.11)
RDW: 13.3 % (ref 11.5–15.5)
WBC: 8.3 10*3/uL (ref 4.0–10.5)

## 2020-12-12 LAB — COMPREHENSIVE METABOLIC PANEL
ALT: 11 U/L (ref 0–35)
AST: 15 U/L (ref 0–37)
Albumin: 3.8 g/dL (ref 3.5–5.2)
Alkaline Phosphatase: 53 U/L (ref 39–117)
BUN: 20 mg/dL (ref 6–23)
CO2: 29 mEq/L (ref 19–32)
Calcium: 9.6 mg/dL (ref 8.4–10.5)
Chloride: 101 mEq/L (ref 96–112)
Creatinine, Ser: 1.28 mg/dL — ABNORMAL HIGH (ref 0.40–1.20)
GFR: 45.18 mL/min — ABNORMAL LOW (ref >60.00)
Glucose, Bld: 92 mg/dL (ref 70–99)
Potassium: 3.7 mEq/L (ref 3.5–5.1)
Sodium: 136 mEq/L (ref 135–145)
Total Bilirubin: 0.3 mg/dL (ref 0.2–1.2)
Total Protein: 7.8 g/dL (ref 6.0–8.3)

## 2020-12-12 LAB — AMYLASE: Amylase: 54 U/L (ref 27–131)

## 2020-12-12 LAB — LIPASE: Lipase: 31 U/L (ref 11.0–59.0)

## 2020-12-12 NOTE — Progress Notes (Signed)
Marie Torres is a 60 y.o. female with the following history as recorded in [employer]:  Patient Active Problem List   Diagnosis Date Noted  . Stage 3a chronic kidney disease ([place]) 11/02/2020  . Hyperplastic colonic polyp 06/29/2020  . Cervical cancer screening 06/28/2020  . Vitamin D deficiency disease 04/29/2019  . Essential hypertension 04/29/2019  . Hyperlipidemia with target LDL less than 100 04/29/2019  . Bilateral edema of lower extremity 05/27/2017  . Routine general medical examination at a health care facility 06/04/2016  . Visit for screening mammogram 06/04/2016  . Insomnia 08/17/2015  . Rectal incontinence 08/17/2015  . Pain due to interstitial cystitis 09/16/2014  . ESOPHAGITIS, REFLUX 08/14/2007    Current Outpatient Medications  Medication Sig Dispense Refill  . Cholecalciferol (VITAMIN D3) 1.25 MG (50000 UT) CAPS TAKE 1 CAPSULE BY MOUTH ONE TIME PER WEEK 12 capsule 0  . colchicine 0.6 MG tablet colchicine 0.6 mg tablet    . ibuprofen (ADVIL) 800 MG tablet Take 1 tablet (800 mg total) by mouth every 6 (six) hours as needed. 60 tablet 1  . meloxicam (MOBIC) 15 MG tablet Take 1 tablet (15 mg total) by mouth daily. 30 tablet 0  . methylPREDNISolone (MEDROL DOSEPAK) 4 MG TBPK tablet Take as directed 21 each 0  . rosuvastatin (CRESTOR) 5 MG tablet TAKE 1 TABLET BY MOUTH EVERY DAY 90 tablet 1  . triamterene-hydrochlorothiazide (DYAZIDE) 37.5-25 MG capsule Take 1 each (1 capsule total) by mouth daily. 90 capsule 1   No current facility-administered medications for this visit.    Allergies: Shellfish allergy  Past Medical History:  Diagnosis Date  . Arthritis    right hand  . Chicken pox    Childhood  . Diverticulosis of colon (without mention of hemorrhage)   . GERD (gastroesophageal reflux disease)   . Hiatal hernia   . Hyperplastic colon polyp   . Mass of finger of right hand 02/2013   right middle finger  . Seasonal allergies   . Sinus congestion 03/16/2013    cough, stuffy and runny nose  . Urine incontinence   . Vitamin D deficiency   . Wears partial dentures    upper    Past Surgical History:  Procedure Laterality Date  . COLONOSCOPY  08/14/2007   562.10, 211.3  . FOOT SURGERY Bilateral    exc. ingrown toenail great toe  . TUBAL LIGATION  09/02/2002    Family History  Problem Relation Age of Onset  . Kidney disease Father   . Heart disease Father   . Stroke Father   . Hypertension Father   . Heart disease Mother   . Depression Mother   . Cancer Sister        lung  . Breast cancer Sister   . Breast cancer Sister   . Colon cancer Neg Hx     Social History   Tobacco Use  . Smoking status: Former Research scientist (life sciences)  . Smokeless tobacco: Never Used  . Tobacco comment: quit smoking 20 years ago  Substance Use Topics  . Alcohol use: Yes    Alcohol/week: 21.0 standard drinks    Types: 21 Glasses of wine per week    Comment: social     Subjective:   I connected with [name] on 12/12/20 at 12:00 PM EST by a telephone call and verified that I am speaking with the correct person using two identifiers.   I discussed the limitations of evaluation and management by telemedicine and the availability of in  person appointments. The patient expressed understanding and agreed to proceed. Provider in office/ patient is at home; provider and patient are only 2 people on telephone call.   Diarrhea x 1 1/2 week; no abdominal pain or blood in stool; "just started suddenly- has had a few accidents." No recent antibiotics; has recently been on steroids and Meloxicam per her podiatrist; Overdue for colonoscopy- last one done in 2015- was due in 2020;     Objective:  There were no vitals filed for this visit.   Lungs: Respirations unlabored;  Neurologic: Alert and oriented; speech intact;    Assessment:  1. Diarrhea, unspecified type     Plan:  Dietary options discussed; patient will go to [facility] for labs and stool culture; may also need to  consider abdomimal imaging; GI referral updated;   Time spent 12 minutes  No follow-ups on file.  Orders Placed This Encounter  Procedures  . CBC with Differential/Platelet    Standing Status:   Future    Standing Expiration Date:   12/12/2021  . Comp Met (CMET)    Standing Status:   Future    Standing Expiration Date:   12/12/2021  . Amylase    Standing Status:   Future    Standing Expiration Date:   12/12/2021  . Lipase    Standing Status:   Future    Standing Expiration Date:   12/12/2021  . Gastrointestinal Pathogen Panel PCR    Standing Status:   Future    Standing Expiration Date:   12/12/2021    Requested Prescriptions    No prescriptions requested or ordered in this encounter

## 2020-12-13 ENCOUNTER — Other Ambulatory Visit: Payer: Self-pay

## 2020-12-13 ENCOUNTER — Ambulatory Visit: Payer: BC Managed Care – PPO | Admitting: Podiatry

## 2020-12-13 DIAGNOSIS — M722 Plantar fascial fibromatosis: Secondary | ICD-10-CM | POA: Diagnosis not present

## 2020-12-14 ENCOUNTER — Other Ambulatory Visit: Payer: Self-pay | Admitting: Podiatry

## 2020-12-14 ENCOUNTER — Other Ambulatory Visit: Payer: Self-pay | Admitting: Internal Medicine

## 2020-12-14 ENCOUNTER — Encounter: Payer: Self-pay | Admitting: Podiatry

## 2020-12-14 DIAGNOSIS — I10 Essential (primary) hypertension: Secondary | ICD-10-CM

## 2020-12-14 NOTE — Progress Notes (Signed)
Subjective:  Patient ID: Marie Torres, female    DOB: November 24, 1960,  MRN: [id number]  Chief Complaint  Patient presents with  . Foot Pain    PT stated that her right foot is doing a lot better    60 y.o. female presents with the above complaint.  Patient presents with follow-up of right first metatarsophalangeal joint pain.  Patient states is doing a lot better she does not have any further pain.  The steroid injection helped considerably.  She is on medication for gout as well.  She now has a new complaint of left plantar fasciitis.  She states that it hurts when taking the first step in the morning has progressively gotten worse is dull achy in nature with bruising on the bottom of the heel.  She states that she has not tried anything for it she would like to discuss treatment options she has not seen anyone else prior to seeing me for this.   Review of Systems: Negative except as noted in the HPI. Denies N/V/F/Ch.  Past Medical History:  Diagnosis Date  . Arthritis    right hand  . Chicken pox    Childhood  . Diverticulosis of colon (without mention of hemorrhage)   . GERD (gastroesophageal reflux disease)   . Hiatal hernia   . Hyperplastic colon polyp   . Mass of finger of right hand 02/2013   right middle finger  . Seasonal allergies   . Sinus congestion 03/16/2013   cough, stuffy and runny nose  . Urine incontinence   . Vitamin D deficiency   . Wears partial dentures    upper    Current Outpatient Medications:  .  Cholecalciferol (VITAMIN D3) 1.25 MG (50000 UT) CAPS, TAKE 1 CAPSULE BY MOUTH ONE TIME PER WEEK, Disp: 12 capsule, Rfl: 0 .  colchicine 0.6 MG tablet, colchicine 0.6 mg tablet, Disp: , Rfl:  .  ibuprofen (ADVIL) 800 MG tablet, Take 1 tablet (800 mg total) by mouth every 6 (six) hours as needed., Disp: 60 tablet, Rfl: 1 .  meloxicam (MOBIC) 15 MG tablet, Take 1 tablet (15 mg total) by mouth daily., Disp: 30 tablet, Rfl: 0 .  methylPREDNISolone (MEDROL DOSEPAK) 4 MG  TBPK tablet, Take as directed, Disp: 21 each, Rfl: 0 .  rosuvastatin (CRESTOR) 5 MG tablet, TAKE 1 TABLET BY MOUTH EVERY DAY, Disp: 90 tablet, Rfl: 1 .  triamterene-hydrochlorothiazide (DYAZIDE) 37.5-25 MG capsule, Take 1 each (1 capsule total) by mouth daily., Disp: 90 capsule, Rfl: 1  Social History   Tobacco Use  Smoking Status Former Smoker  Smokeless Tobacco Never Used  Tobacco Comment   quit smoking 20 years ago    Allergies  Allergen Reactions  . Shellfish Allergy Hives   Objective:  There were no vitals filed for this visit. There is no height or weight on file to calculate BMI. Constitutional Well developed. Well nourished.  Vascular Dorsalis pedis pulses palpable bilaterally. Posterior tibial pulses palpable bilaterally. Capillary refill normal to all digits.  No cyanosis or clubbing noted. Pedal hair growth normal.  Neurologic Normal speech. Oriented to person, place, and time. Epicritic sensation to light touch grossly present bilaterally.  Dermatologic Nails well groomed and normal in appearance. No open wounds. No skin lesions.  Orthopedic: Normal joint ROM without pain or crepitus bilaterally. No visible deformities. Tender to palpation at the calcaneal tuber left. No pain with calcaneal squeeze left. Ankle ROM diminished range of motion left. Silfverskiold Test: positive left.   Radiographs: None  Assessment:   1. Plantar fasciitis of left foot    Plan:  Patient was evaluated and treated and all questions answered.  Plantar Fasciitis, left - XR reviewed as above.  - Educated on icing and stretching. Instructions given.  -I will hold off injection delivered to the plantar fascia for now. - DME: Plantar Fascial Brace - Pharmacologic management: None -If there is no improvement would just plan fascial brace we will discuss injection during next medical visit  No follow-ups on file.

## 2020-12-14 NOTE — Telephone Encounter (Signed)
Please advise 

## 2020-12-15 LAB — GI PROFILE, STOOL, PCR

## 2020-12-18 ENCOUNTER — Telehealth: Payer: Self-pay | Admitting: Internal Medicine

## 2020-12-18 NOTE — Telephone Encounter (Signed)
Pt has been informed of results per Jodi Mourning, NP note. She expressed understanding.

## 2020-12-18 NOTE — Telephone Encounter (Signed)
Patient is requesting a call back in regards to recent lab work. She can be reached at [phone]. She has a Hospital doctor w/ [facility] on 2.15.22

## 2020-12-25 ENCOUNTER — Encounter: Payer: Self-pay | Admitting: Nurse Practitioner

## 2021-01-09 ENCOUNTER — Other Ambulatory Visit: Payer: Self-pay

## 2021-01-09 ENCOUNTER — Encounter: Payer: Self-pay | Admitting: Nurse Practitioner

## 2021-01-09 ENCOUNTER — Ambulatory Visit: Payer: BC Managed Care – PPO | Admitting: Nurse Practitioner

## 2021-01-09 ENCOUNTER — Ambulatory Visit (INDEPENDENT_AMBULATORY_CARE_PROVIDER_SITE_OTHER)
Admission: RE | Admit: 2021-01-09 | Discharge: 2021-01-09 | Disposition: A | Discharge status: Home / Self Care | Payer: BC Managed Care – PPO | Source: Ambulatory Visit | Attending: Nurse Practitioner | Admitting: Nurse Practitioner

## 2021-01-09 VITALS — BP 138/80 | HR 73 | Ht 61.0 in | Wt 169.0 lb

## 2021-01-09 DIAGNOSIS — R159 Full incontinence of feces: Secondary | ICD-10-CM

## 2021-01-09 NOTE — Patient Instructions (Addendum)
If you are age 62 or older, your body mass index should be between 23-30. Your Body mass index is 31.93 kg/m. If this is out of the aforementioned range listed, please consider follow up with your Primary Care Provider.  If you are age 54 or younger, your body mass index should be between 19-25. Your Body mass index is 31.93 kg/m. If this is out of the aformentioned range listed, please consider follow up with your Primary Care Provider.   Your provider has requested that you have an abdominal x ray before leaving today. Please go to the basement floor to our Radiology department for the test.  Give enema prior to office visit.   Do tap water enema daily.   Normal.

## 2021-01-09 NOTE — Progress Notes (Signed)
ASSESSMENT AND PLAN    # 60 yo female with one month history of intermittent fecal incontinence occurring at least once a day.  Unclear why this sudden onset of fecal incontinence fecal impaction with overflow is possible.  She has no urinary incontinence.  No recent anal trauma. I was unable to perform a digital rectal exam as there was too much soft stool in the vault. --Advised patient to use a tap water enema every morning.  This includes a tap water enema after any a.m. bowel movement to try and clear out any residual stool.  --I am rescheduling her with me so that I can do a digital rectal exam.  She will use an enema prior to our visit  #Colon cancer screening.  She had a complete colonoscopy with an excellent prep in 2015.  She had only diverticulosis, hemorrhoids and hyperplastic polyps.  She was deemed to be average risk for colon cancer at the time of screening.  A 5-year follow-up colonoscopy was recommended but I am unclear as to why. I think she can probably get a 10 year follow up in 2025 but will defer to her primary GI.    HISTORY OF PRESENT ILLNESS     Chief Complaint : leakage of bowel movement   Marie Torres is a 60 y.o. female previously followed by Dr. Sharlett Iles. with past medical history significant for GERD,.hiatal hernia , diverticulosis, hemorrhoids  Patient is referred by PCP for evaluation of diarrhea.  Patient had a telehealth visit with her PCP in mid February for evaluation of diarrhea and incontinence.  Stool studies were negative.  Patient tells me she has not been having diarrhea rather has been incontinent of a large amount of soft, brown stool at least once a day . She is also having some normal bowel movements but they are often associate with some urgency.  Prior to onset of these symptoms she was having normal. bowel movements and had no problems with incontinence.   This stool incontinence came about rather suddenly a month ago .  She has no urinary  incontinence.  She has not seen any blood in her stools, no abdominal pain.   12/12/20 CMP normal except for mildly elevated creatinine CBC normal  PREVIOUS EVALUATIONS:   Feb 2015 colonoscopy for screening and rectal bleeding --diverticulosis --multiple hyperplastic rectosigmoid polyps.    Path c/w hyperplastic polyp   Past Medical History:  Diagnosis Date  . Arthritis    right hand  . Chicken pox    Childhood  . Diverticulosis of colon (without mention of hemorrhage)   . GERD (gastroesophageal reflux disease)   . Hiatal hernia   . Hyperplastic colon polyp   . Mass of finger of right hand 02/2013   right middle finger  . Seasonal allergies   . Sinus congestion 03/16/2013   cough, stuffy and runny nose  . Urine incontinence   . Vitamin D deficiency   . Wears partial dentures    upper     Past Surgical History:  Procedure Laterality Date  . COLONOSCOPY  08/14/2007   562.10, 211.3  . FOOT SURGERY Bilateral    exc. ingrown toenail great toe  . TUBAL LIGATION  09/02/2002   Family History  Problem Relation Age of Onset  . Kidney disease Father   . Heart disease Father   . Stroke Father   . Hypertension Father   . Heart disease Mother   . Depression Mother   . Cancer  Sister        lung  . Breast cancer Sister        both breast removed  . Colon cancer Neg Hx   . Esophageal cancer Neg Hx    Social History   Tobacco Use  . Smoking status: Former Research scientist (life sciences)  . Smokeless tobacco: Never Used  . Tobacco comment: quit smoking 20 years ago  Vaping Use  . Vaping Use: Never used  Substance Use Topics  . Alcohol use: Not Currently    Alcohol/week: 21.0 standard drinks    Types: 21 Glasses of wine per week    Comment: not b/c of gout  . Drug use: No   Current Outpatient Medications  Medication Sig Dispense Refill  . ibuprofen (ADVIL) 800 MG tablet Take 1 tablet (800 mg total) by mouth every 6 (six) hours as needed. 60 tablet 1  . rosuvastatin (CRESTOR) 5 MG tablet  TAKE 1 TABLET BY MOUTH EVERY DAY 90 tablet 1  . triamterene-hydrochlorothiazide (DYAZIDE) 37.5-25 MG capsule TAKE 1 CAPSULE BY MOUTH EVERY DAY 90 capsule 1   No current facility-administered medications for this visit.   Allergies  Allergen Reactions  . Shellfish Allergy Hives     Review of Systems:  All systems reviewed and negative except where noted in HPI.   PHYSICAL EXAM :    Wt Readings from Last 3 Encounters:  01/09/21 169 lb (76.7 kg)  11/01/20 172 lb (78 kg)  06/28/20 178 lb (80.7 kg)    Ht 5\' 1"  (1.549 m)   Wt 169 lb (76.7 kg)   BMI 31.93 kg/m  Constitutional:  Pleasant female in no acute distress. Psychiatric: Normal mood and affect. Behavior is normal. EENT: Pupils normal.  Conjunctivae are normal. No scleral icterus. Neck supple.  Cardiovascular: Normal rate, regular rhythm. No edema Pulmonary/chest: Effort normal and breath sounds normal. No wheezing, rales or rhonchi. Abdominal: Soft, nondistended, nontender. Bowel sounds active throughout. There are no masses palpable. No hepatomegaly. Rectal:  She had a large amount soft, brown stool in perianal area. Large amount of soft stool in vault.  Neurological: Alert and oriented to person place and time. Skin: Skin is warm and dry. No rashes noted.  Tye Savoy, NP  01/09/2021, 3:35 PM  Cc:  Referring Provider Marrian Salvage, NP

## 2021-01-10 ENCOUNTER — Ambulatory Visit: Payer: BC Managed Care – PPO | Admitting: Podiatry

## 2021-01-10 DIAGNOSIS — M722 Plantar fascial fibromatosis: Secondary | ICD-10-CM

## 2021-01-10 NOTE — Progress Notes (Signed)
____________________________________________________________  Attending physician addendum:  Thank you for sending this case to me. I have reviewed the entire note and agree with the plan.  She had intermittent fecal incontinence (to a lesser degree) when I saw her in 2018.   When you see her for the upcoming appointment to perform DRE and assess sphincter tone, please schedule a colonoscopy to follow.  Wilfrid Lund, MD  ____________________________________________________________

## 2021-01-16 ENCOUNTER — Encounter: Payer: Self-pay | Admitting: Podiatry

## 2021-01-16 NOTE — Progress Notes (Signed)
  Subjective:  Patient ID: Marie Torres, female    DOB: [birth year],  MRN: [id number]  Chief Complaint  Patient presents with  . Plantar Fasciitis    PT stated that she is doing a little better she does still have some swelling but there has been an improvement with the pain     60 y.o. female presents with the above complaint.  Patient presents with a follow-up of left plantar fasciitis.  She states she is doing a lot better.  The bracing has helped considerably.  She has not had to take any current injection.  She denies any other acute complaints.  Review of Systems: Negative except as noted in the HPI. Denies N/V/F/Ch.  Past Medical History:  Diagnosis Date  . Arthritis    right hand  . Chicken pox    Childhood  . Diverticulosis of colon (without mention of hemorrhage)   . GERD (gastroesophageal reflux disease)   . Hiatal hernia   . Hyperplastic colon polyp   . Mass of finger of right hand 02/2013   right middle finger  . Seasonal allergies   . Sinus congestion 03/16/2013   cough, stuffy and runny nose  . Urine incontinence   . Vitamin D deficiency   . Wears partial dentures    upper    Current Outpatient Medications:  .  ibuprofen (ADVIL) 800 MG tablet, Take 1 tablet (800 mg total) by mouth every 6 (six) hours as needed., Disp: 60 tablet, Rfl: 1 .  rosuvastatin (CRESTOR) 5 MG tablet, TAKE 1 TABLET BY MOUTH EVERY DAY, Disp: 90 tablet, Rfl: 1 .  triamterene-hydrochlorothiazide (DYAZIDE) 37.5-25 MG capsule, TAKE 1 CAPSULE BY MOUTH EVERY DAY, Disp: 90 capsule, Rfl: 1  Social History   Tobacco Use  Smoking Status Former Smoker  Smokeless Tobacco Never Used  Tobacco Comment   quit smoking 20 years ago    Allergies  Allergen Reactions  . Shellfish Allergy Hives   Objective:  There were no vitals filed for this visit. There is no height or weight on file to calculate BMI. Constitutional Well developed. Well nourished.  Vascular Dorsalis pedis pulses palpable  bilaterally. Posterior tibial pulses palpable bilaterally. Capillary refill normal to all digits.  No cyanosis or clubbing noted. Pedal hair growth normal.  Neurologic Normal speech. Oriented to person, place, and time. Epicritic sensation to light touch grossly present bilaterally.  Dermatologic Nails well groomed and normal in appearance. No open wounds. No skin lesions.  Orthopedic: Normal joint ROM without pain or crepitus bilaterally. No visible deformities. No tender to palpation at the calcaneal tuber left. No pain with calcaneal squeeze left. Ankle ROM diminished range of motion left. Silfverskiold Test: positive left.   Radiographs: None  Assessment:   1. Plantar fasciitis of left foot    Plan:  Patient was evaluated and treated and all questions answered.  Plantar Fasciitis, left -Clinically healed with plantar fascial bracing.  I discussed with her that we will continue to hold off steroid injection given that she is about 90 to 95% better.  Overall I discussed with her shoe gear modification orthotics insoles and bracing as needed.  If any foot and ankle issues arise in the future come back and see me right away.  If her plantar fasciitis recurs I discussed with her to come see me right away.  No follow-ups on file.

## 2021-01-19 ENCOUNTER — Encounter: Payer: Self-pay | Admitting: Nurse Practitioner

## 2021-01-19 ENCOUNTER — Ambulatory Visit: Payer: BC Managed Care – PPO | Admitting: Nurse Practitioner

## 2021-01-19 ENCOUNTER — Other Ambulatory Visit: Payer: Self-pay

## 2021-01-19 VITALS — BP 120/70 | HR 60 | Ht 61.0 in | Wt 169.0 lb

## 2021-01-19 DIAGNOSIS — R159 Full incontinence of feces: Secondary | ICD-10-CM

## 2021-01-19 MED ORDER — PLENVU 140 G PO SOLR: 1.0000 | ORAL | 0 refills | Status: DC

## 2021-01-19 NOTE — Patient Instructions (Signed)
If you are age 62 or older, your body mass index should be between 23-30. Your Body mass index is 31.93 kg/m. If this is out of the aforementioned range listed, please consider follow up with your Primary Care Provider.  If you are age 85 or younger, your body mass index should be between 19-25. Your Body mass index is 31.93 kg/m. If this is out of the aformentioned range listed, please consider follow up with your Primary Care Provider.   You have been scheduled for a colonoscopy. Please follow written instructions given to you at your visit today.  Please pick up your prep supplies at the pharmacy within the next 1-3 days. If you use inhalers (even only as needed), please bring them with you on the day of your procedure.  Continue Miralax daily.  Follow up as needed.  Thank you for entrusting me with your care and choosing [facility].  Tye Savoy, NP-C

## 2021-01-19 NOTE — Progress Notes (Signed)
ASSESSMENT AND PLAN    # 60 year old female here for follow-up on fecal incontinence.  It was felt she had some overflow due to constipation . She administered an enema prior to this visit so I could perform a DRE (unable to complete at last visit due to incontinence of a large amount of stool).  Patient thinks some of the incontinence was happening because of heavy lifting at work.  The incontinence has significantly improved since I saw her several days ago.  On exam there is slightly diminished rectal sphincter tone but squeeze pressure seemed adequate --Start daily MiraLAX to prevent constipation and hopefully what was likely overflow --Dr. Loletha Carrow is requesting she have a colonoscopy so I will get this scheduled. Patient will be scheduled for a colonoscopy. The risks and benefits of colonoscopy with possible polypectomy / biopsies were discussed and the patient agrees to proceed.   HISTORY OF PRESENT ILLNESS    Chief Complaint : Follow-up on fecal incontinence  Marie Torres is a 60 y.o. female known to Dr. Loletha Carrow .  I saw patient on the 15th of this month for evaluation of fecal incontinence.  At the time of the visit patient was incontinent of a large amount of soft stool so a rectal exam could not be done.  I asked her to administer a tap water enema prior to her follow-up visit.  She is here for follow-up  INTERVAL HISTORY: Patient says that her incontinence has actually improved.  Occasionally she has a small amount of soft stool leakage.  She says she is having a bowel movement every day.  Prior to this visit she took an enema as recommended though she used soap and water   PREVIOUS ENDOSCOPIC EVALUATIONS / PERTINENT STUDIES:   Feb 2015 colonoscopy for screening and rectal bleeding --diverticulosis --multiple hyperplastic rectosigmoid polyps.    Path c/w hyperplastic polyp   Past Medical History:  Diagnosis Date  . Arthritis    right hand  . Chicken pox    Childhood  .  Diverticulosis of colon (without mention of hemorrhage)   . GERD (gastroesophageal reflux disease)   . Hiatal hernia   . Hyperplastic colon polyp   . Mass of finger of right hand 02/2013   right middle finger  . Seasonal allergies   . Sinus congestion 03/16/2013   cough, stuffy and runny nose  . Urine incontinence   . Vitamin D deficiency   . Wears partial dentures    upper    Current Medications, Allergies, Past Surgical History, Family History and Social History were reviewed in [employer] record.   Current Outpatient Medications  Medication Sig Dispense Refill  . ibuprofen (ADVIL) 800 MG tablet Take 1 tablet (800 mg total) by mouth every 6 (six) hours as needed. 60 tablet 1  . rosuvastatin (CRESTOR) 5 MG tablet TAKE 1 TABLET BY MOUTH EVERY DAY 90 tablet 1  . triamterene-hydrochlorothiazide (DYAZIDE) 37.5-25 MG capsule TAKE 1 CAPSULE BY MOUTH EVERY DAY 90 capsule 1   No current facility-administered medications for this visit.    Review of Systems: No chest pain. No shortness of breath. No urinary complaints.   PHYSICAL EXAM :    Wt Readings from Last 3 Encounters:  01/09/21 169 lb (76.7 kg)  11/01/20 172 lb (78 kg)  06/28/20 178 lb (80.7 kg)    BP 120/70   Pulse 60   Ht 5\' 1"  (1.549 m)   Wt 169 lb (76.7 kg)  BMI 31.93 kg/m  Constitutional:  Pleasant female in no acute distress. Psychiatric: Normal mood and affect. Behavior is normal.  Cardiovascular: Normal rate, regular rhythm. No edema Pulmonary/chest: Effort normal and breath sounds normal. No wheezing, rales or rhonchi. Abdominal: Soft, nondistended, nontender. Bowel sounds active throughout. Rectal: Unable to elicit an anal wink, resting sphincter tone was slightly diminished, squeeze pressure seemed adequate, good pelvic descent Neurological: Alert and oriented to person place and time.   Tye Savoy, NP  01/19/2021, 8:39 AM

## 2021-01-23 NOTE — Progress Notes (Signed)
____________________________________________________________  Attending physician addendum:  Thank you for sending this case to me. I have reviewed the entire note and agree with the plan.   Syretta Kochel Danis, MD  ____________________________________________________________  

## 2021-04-02 ENCOUNTER — Encounter: Payer: BC Managed Care – PPO | Admitting: Gastroenterology

## 2021-04-06 ENCOUNTER — Ambulatory Visit: Payer: BC Managed Care – PPO | Admitting: Podiatry

## 2021-04-13 ENCOUNTER — Ambulatory Visit: Payer: BC Managed Care – PPO | Admitting: Podiatry

## 2021-04-13 ENCOUNTER — Other Ambulatory Visit: Payer: Self-pay

## 2021-04-13 DIAGNOSIS — M19079 Primary osteoarthritis, unspecified ankle and foot: Secondary | ICD-10-CM | POA: Diagnosis not present

## 2021-04-18 ENCOUNTER — Encounter: Payer: Self-pay | Admitting: Podiatry

## 2021-04-18 ENCOUNTER — Other Ambulatory Visit: Payer: Self-pay | Admitting: Internal Medicine

## 2021-04-18 DIAGNOSIS — E785 Hyperlipidemia, unspecified: Secondary | ICD-10-CM

## 2021-04-18 NOTE — Progress Notes (Signed)
Subjective:  Patient ID: Marie Torres, female    DOB: Apr 27, 1961,  MRN: [id number]  Chief Complaint  Patient presents with   Plantar Fasciitis    PT stated that she has been having some numbness with her left foot     60 y.o. female presents with the above complaint.  Patient presents with new complaint of left midfoot pain.  She states that she is having some numbness and swelling in her foot.  She states it hurts with ambulation.  Hurts when she has been on her foot a lot.  She works primarily on her foot.  She denies any other acute complaint she would like to get this treated she has not seen anyone else prior to seeing me.  She states her plantar fasciitis pain is doing really good.  She denies any other acute complaints.  She has a history of gout as well.   Review of Systems: Negative except as noted in the HPI. Denies N/V/F/Ch.  Past Medical History:  Diagnosis Date   Arthritis    right hand   Chicken pox    Childhood   Diverticulosis of colon (without mention of hemorrhage)    GERD (gastroesophageal reflux disease)    Hiatal hernia    Hyperplastic colon polyp    Mass of finger of right hand 02/2013   right middle finger   Seasonal allergies    Sinus congestion 03/16/2013   cough, stuffy and runny nose   Urine incontinence    Vitamin D deficiency    Wears partial dentures    upper    Current Outpatient Medications:    ibuprofen (ADVIL) 800 MG tablet, Take 1 tablet (800 mg total) by mouth every 6 (six) hours as needed., Disp: 60 tablet, Rfl: 1   PEG-KCl-NaCl-NaSulf-Na Asc-C (PLENVU) 140 g SOLR, Take 1 kit by mouth as directed., Disp: 1 each, Rfl: 0   rosuvastatin (CRESTOR) 5 MG tablet, TAKE 1 TABLET BY MOUTH EVERY DAY, Disp: 90 tablet, Rfl: 1   triamterene-hydrochlorothiazide (DYAZIDE) 37.5-25 MG capsule, TAKE 1 CAPSULE BY MOUTH EVERY DAY, Disp: 90 capsule, Rfl: 1  Social History   Tobacco Use  Smoking Status Former   Pack years: 0.00  Smokeless Tobacco Never   Tobacco Comments   quit smoking 20 years ago    Allergies  Allergen Reactions   Shellfish Allergy Hives   Objective:  There were no vitals filed for this visit. There is no height or weight on file to calculate BMI. Constitutional Well developed. Well nourished.  Vascular Dorsalis pedis pulses palpable bilaterally. Posterior tibial pulses palpable bilaterally. Capillary refill normal to all digits.  No cyanosis or clubbing noted. Pedal hair growth normal.  Neurologic Normal speech. Oriented to person, place, and time. Epicritic sensation to light touch grossly present bilaterally.  Dermatologic Nails well groomed and normal in appearance. No open wounds. No skin lesions.  Orthopedic: Pain on palpation left midfoot.  No pain with range of motion of the metatarsophalangeal joints.  No extensor or flexor tendinitis noted.  Clinically I can appreciate some spurring on the dorsal midfoot.  Pain with range of motion of the tarsometatarsal joint second and third.   Radiographs: None Assessment:   1. Arthritis of midfoot    Plan:  Patient was evaluated and treated and all questions answered.  Left midfoot arthritis with underlying neuritis -I explained the patient the etiology of arthritis and various treatment options were discussed.  Ultimately I believe patient will benefit from steroid injection help decrease  acute inflammatory component associate with pain.  However patient would like to hold off on any kind injections.  She would like to discuss topical application.  I discussed with her that she could benefit from Voltaren gel that can help decrease some of the inflammation.  She states understanding will obtain them over-the-counter.  No follow-ups on file.

## 2021-05-16 ENCOUNTER — Ambulatory Visit: Payer: BC Managed Care – PPO | Admitting: Internal Medicine

## 2021-05-16 ENCOUNTER — Other Ambulatory Visit (INDEPENDENT_AMBULATORY_CARE_PROVIDER_SITE_OTHER): Payer: BC Managed Care – PPO

## 2021-05-16 ENCOUNTER — Other Ambulatory Visit: Payer: Self-pay

## 2021-05-16 ENCOUNTER — Encounter: Payer: Self-pay | Admitting: Internal Medicine

## 2021-05-16 VITALS — BP 140/82 | HR 82 | Temp 98.4°F | Resp 16 | Ht 61.0 in | Wt 174.0 lb

## 2021-05-16 DIAGNOSIS — R270 Ataxia, unspecified: Secondary | ICD-10-CM

## 2021-05-16 DIAGNOSIS — N1831 Chronic kidney disease, stage 3a: Secondary | ICD-10-CM | POA: Diagnosis not present

## 2021-05-16 DIAGNOSIS — I10 Essential (primary) hypertension: Secondary | ICD-10-CM | POA: Diagnosis not present

## 2021-05-16 DIAGNOSIS — H538 Other visual disturbances: Secondary | ICD-10-CM | POA: Diagnosis not present

## 2021-05-16 LAB — BASIC METABOLIC PANEL
BUN: 12 mg/dL (ref 6–23)
CO2: 24 mEq/L (ref 19–32)
Calcium: 8.8 mg/dL (ref 8.4–10.5)
Chloride: 107 mEq/L (ref 96–112)
Creatinine, Ser: 1.02 mg/dL (ref 0.40–1.20)
GFR: 59.15 mL/min — ABNORMAL LOW (ref >60.00)
Glucose, Bld: 95 mg/dL (ref 70–99)
Potassium: 3.9 mEq/L (ref 3.5–5.1)
Sodium: 141 mEq/L (ref 135–145)

## 2021-05-16 LAB — CBC WITH DIFFERENTIAL/PLATELET
Basophils Absolute: 0 10*3/uL (ref 0.0–0.1)
Basophils Relative: 0.2 % (ref 0.0–3.0)
Eosinophils Absolute: 0.2 10*3/uL (ref 0.0–0.7)
Eosinophils Relative: 1.8 % (ref 0.0–5.0)
HCT: 37 % (ref 36.0–46.0)
Hemoglobin: 12.4 g/dL (ref 12.0–15.0)
Lymphocytes Relative: 24.1 % (ref 12.0–46.0)
Lymphs Abs: 2.3 10*3/uL (ref 0.7–4.0)
MCHC: 33.6 g/dL (ref 30.0–36.0)
MCV: 86.6 fl (ref 78.0–100.0)
Monocytes Absolute: 0.7 10*3/uL (ref 0.1–1.0)
Monocytes Relative: 7.7 % (ref 3.0–12.0)
Neutro Abs: 6.4 10*3/uL (ref 1.4–7.7)
Neutrophils Relative %: 66.2 % (ref 43.0–77.0)
Platelets: 249 10*3/uL (ref 150.0–400.0)
RBC: 4.27 Mil/uL (ref 3.87–5.11)
RDW: 13.2 % (ref 11.5–15.5)
WBC: 9.7 10*3/uL (ref 4.0–10.5)

## 2021-05-16 NOTE — Progress Notes (Signed)
Subjective:  Patient ID: Marie Torres, female    DOB: 01-31-1961  Age: 60 y.o. MRN: [id number]  CC: Hypertension  This visit occurred during the SARS-CoV-2 public health emergency.  Safety protocols were in place, including screening questions prior to the visit, additional usage of staff PPE, and extensive cleaning of exam room while observing appropriate contact time as indicated for disinfecting solutions.    HPI Marie Torres presents for f/up -  She complains of a 1 month history of ataxia.  She feels off balance when she moves from side to side.  She is also had mild intermittent blurred vision.  She is active and denies any recent episodes of chest pain, shortness of breath, palpitations, edema, fatigue, or diaphoresis.  Outpatient Medications Prior to Visit  Medication Sig Dispense Refill   ibuprofen (ADVIL) 800 MG tablet Take 1 tablet (800 mg total) by mouth every 6 (six) hours as needed. 60 tablet 1   rosuvastatin (CRESTOR) 5 MG tablet TAKE 1 TABLET BY MOUTH EVERY DAY 90 tablet 1   triamterene-hydrochlorothiazide (DYAZIDE) 37.5-25 MG capsule TAKE 1 CAPSULE BY MOUTH EVERY DAY 90 capsule 1   PEG-KCl-NaCl-NaSulf-Na Asc-C (PLENVU) 140 g SOLR Take 1 kit by mouth as directed. 1 each 0   No facility-administered medications prior to visit.    ROS Review of Systems  Constitutional:  Negative for chills, diaphoresis, fatigue and fever.  HENT: Negative.    Eyes:  Positive for visual disturbance. Negative for photophobia.  Respiratory:  Negative for cough, chest tightness, shortness of breath and wheezing.   Cardiovascular:  Negative for chest pain, palpitations and leg swelling.  Gastrointestinal:  Negative for abdominal pain, nausea and vomiting.  Endocrine: Negative.   Genitourinary: Negative.  Negative for difficulty urinating.  Musculoskeletal:  Positive for gait problem. Negative for arthralgias and neck pain.  Skin: Negative.   Neurological:  Negative for dizziness,  weakness, light-headedness and headaches.  Hematological:  Negative for adenopathy. Does not bruise/bleed easily.  Psychiatric/Behavioral: Negative.     Objective:  BP 140/82 (BP Location: Right Arm, Patient Position: Sitting, Cuff Size: Large)   Pulse 82   Temp 98.4 F (36.9 C) (Oral)   Resp 16   Ht '5\' 1"'  (1.549 m)   Wt 174 lb (78.9 kg)   SpO2 96%   BMI 32.88 kg/m   BP Readings from Last 3 Encounters:  05/16/21 140/82  01/19/21 120/70  01/09/21 138/80    Wt Readings from Last 3 Encounters:  05/16/21 174 lb (78.9 kg)  01/19/21 169 lb (76.7 kg)  01/09/21 169 lb (76.7 kg)    Physical Exam Vitals reviewed.  Constitutional:      Appearance: Normal appearance.  HENT:     Nose: Nose normal.     Mouth/Throat:     Mouth: Mucous membranes are moist.  Eyes:     General: No scleral icterus.    Extraocular Movements: Extraocular movements intact.     Pupils: Pupils are equal, round, and reactive to light.  Cardiovascular:     Rate and Rhythm: Normal rate and regular rhythm.     Heart sounds: No murmur heard. Pulmonary:     Effort: Pulmonary effort is normal.     Breath sounds: No stridor. No wheezing, rhonchi or rales.  Abdominal:     General: Abdomen is flat. Bowel sounds are normal. There is no distension.     Palpations: Abdomen is soft. There is no hepatomegaly, splenomegaly or mass.     Tenderness: There is  no abdominal tenderness.  Musculoskeletal:     Cervical back: Neck supple.     Right lower leg: Edema (trace pitting) present.     Left lower leg: Edema (trace pitting) present.  Lymphadenopathy:     Cervical: No cervical adenopathy.  Skin:    General: Skin is warm and dry.     Findings: No rash.  Neurological:     General: No focal deficit present.     Mental Status: She is alert.     Cranial Nerves: No cranial nerve deficit.     Sensory: No sensory deficit.     Motor: No weakness.     Coordination: Romberg sign negative. Coordination normal.     Gait:  Gait abnormal.     Deep Tendon Reflexes: Reflexes normal.  Psychiatric:        Mood and Affect: Mood normal.        Behavior: Behavior normal.    Lab Results  Component Value Date   WBC 9.7 05/16/2021   HGB 12.4 05/16/2021   HCT 37.0 05/16/2021   PLT 249.0 05/16/2021   GLUCOSE 95 05/16/2021   CHOL 141 06/28/2020   TRIG 94 06/28/2020   HDL 57 06/28/2020   LDLDIRECT 150.8 11/30/2013   LDLCALC 66 06/28/2020   ALT 11 12/12/2020   AST 15 12/12/2020   NA 141 05/16/2021   K 3.9 05/16/2021   CL 107 05/16/2021   CREATININE 1.02 05/16/2021   BUN 12 05/16/2021   CO2 24 05/16/2021   TSH 1.21 05/16/2021   HGBA1C 5.7 04/29/2019    DG Abd 1 View  Result Date: 01/10/2021 CLINICAL DATA:  Constipation with fecal incontinence. EXAM: ABDOMEN - 1 VIEW COMPARISON:  None. FINDINGS: Stool is seen predominantly in the ascending and transverse colon. No small bowel dilatation. No unexpected radiopaque calculi. Visualized lung bases are clear. IMPRESSION: Stool in the ascending and transverse colon may be due to mild constipation. Electronically Signed   By: Lorin Picket M.D.   On: 01/10/2021 14:36    Assessment & Plan:   [place] was seen today for hypertension.  Diagnoses and all orders for this visit:  Ataxia- Labs are reassuring.  I recommended that she undergo an MRI to screen for CVA, NPH, bleed, tumor. -     CBC with Differential/Platelet; Future -     MR Brain Wo Contrast; Future -     CBC with Differential/Platelet  Blurred vision, bilateral- See above. -     CBC with Differential/Platelet; Future -     MR Brain Wo Contrast; Future -     CBC with Differential/Platelet  Essential hypertension- Her blood pressure is not adequately well controlled.  I recommended that she improve her lifestyle modifications. -     CBC with Differential/Platelet; Future -     Basic metabolic panel; Future -     Basic metabolic panel -     CBC with Differential/Platelet -     TSH; Future  Stage  3a chronic kidney disease ([place])- Her renal function is stable.  Will continue to maintain control of her blood pressure. -     CBC with Differential/Platelet; Future -     Basic metabolic panel; Future -     Basic metabolic panel -     CBC with Differential/Platelet  I have discontinued Marie Torres "[name]"'s Plenvu. I am also having her maintain her ibuprofen, triamterene-hydrochlorothiazide, and rosuvastatin.  No orders of the defined types were placed in this encounter.  Follow-up: Return in about 4 weeks (around 06/13/2021).  Scarlette Calico, MD

## 2021-05-16 NOTE — Patient Instructions (Signed)

## 2021-05-17 ENCOUNTER — Encounter: Payer: Self-pay | Admitting: Internal Medicine

## 2021-05-17 LAB — TSH: TSH: 1.21 u[IU]/mL (ref 0.35–5.50)

## 2021-05-22 ENCOUNTER — Encounter: Payer: BC Managed Care – PPO | Admitting: Gastroenterology

## 2021-05-22 ENCOUNTER — Telehealth: Payer: Self-pay | Admitting: Gastroenterology

## 2021-05-22 NOTE — Telephone Encounter (Signed)
Charge a no-show fee.   - HD

## 2021-05-22 NOTE — Telephone Encounter (Signed)
Called patient left msg about her procedure today.  Patient did show or call back.  No showed patient

## 2021-06-06 ENCOUNTER — Encounter: Payer: Self-pay | Admitting: Internal Medicine

## 2021-06-06 ENCOUNTER — Ambulatory Visit
Admission: RE | Admit: 2021-06-06 | Discharge: 2021-06-06 | Disposition: A | Discharge status: Home / Self Care | Payer: BC Managed Care – PPO | Source: Ambulatory Visit | Attending: Internal Medicine | Admitting: Internal Medicine

## 2021-06-06 ENCOUNTER — Other Ambulatory Visit: Payer: Self-pay

## 2021-06-06 DIAGNOSIS — H538 Other visual disturbances: Secondary | ICD-10-CM

## 2021-06-06 DIAGNOSIS — R270 Ataxia, unspecified: Secondary | ICD-10-CM

## 2021-06-29 ENCOUNTER — Other Ambulatory Visit: Payer: Self-pay | Admitting: Internal Medicine

## 2021-06-29 DIAGNOSIS — I10 Essential (primary) hypertension: Secondary | ICD-10-CM

## 2021-09-12 ENCOUNTER — Telehealth: Payer: Self-pay | Admitting: Podiatry

## 2021-09-12 ENCOUNTER — Other Ambulatory Visit: Payer: Self-pay | Admitting: Podiatry

## 2021-09-12 MED ORDER — MELOXICAM 15 MG PO TABS: 15.0000 mg | ORAL_TABLET | Freq: Every day | ORAL | 0 refills | Status: DC

## 2021-09-12 NOTE — Telephone Encounter (Signed)
Patient called the office requesting something for inflammation.      Please advise .Marland KitchenMarland KitchenMarland KitchenMarland Kitchen

## 2021-09-13 ENCOUNTER — Ambulatory Visit (INDEPENDENT_AMBULATORY_CARE_PROVIDER_SITE_OTHER): Payer: BC Managed Care – PPO

## 2021-09-13 ENCOUNTER — Encounter (HOSPITAL_COMMUNITY): Payer: Self-pay | Admitting: Emergency Medicine

## 2021-09-13 ENCOUNTER — Other Ambulatory Visit: Payer: Self-pay

## 2021-09-13 ENCOUNTER — Ambulatory Visit (HOSPITAL_COMMUNITY)
Admission: EM | Admit: 2021-09-13 | Discharge: 2021-09-13 | Disposition: A | Discharge status: Home / Self Care | Payer: BC Managed Care – PPO | Attending: Internal Medicine | Admitting: Internal Medicine

## 2021-09-13 DIAGNOSIS — J309 Allergic rhinitis, unspecified: Secondary | ICD-10-CM | POA: Diagnosis not present

## 2021-09-13 DIAGNOSIS — S8392XA Sprain of unspecified site of left knee, initial encounter: Secondary | ICD-10-CM | POA: Diagnosis not present

## 2021-09-13 DIAGNOSIS — M25562 Pain in left knee: Secondary | ICD-10-CM | POA: Diagnosis not present

## 2021-09-13 MED ORDER — LORATADINE 10 MG PO TABS: 10.0000 mg | ORAL_TABLET | Freq: Every day | ORAL | 0 refills | Status: DC

## 2021-09-13 MED ORDER — FLUTICASONE PROPIONATE 50 MCG/ACT NA SUSP: 1.0000 | Freq: Every day | NASAL | 0 refills | Status: DC

## 2021-09-13 NOTE — ED Provider Notes (Signed)
[facility]    CSN: [id number] Arrival date & time: 09/13/21  1353      History   Chief Complaint Chief Complaint  Patient presents with   Cough    HPI Marie Torres is a 60 y.o. female comes to the urgent care with left knee pain which started 4 days ago after she hit her knee against a desk.  Patient describes the pain as sharp, aggravated by palpation and denies any relieving factors.  Mild swelling over the suprapatellar area.  No bruising noted.  Patient is ambulating with the help of a walker.  No numbness or tingling around the knee.  Patient has a history of nasal congestion and clear rhinorrhea as well as postnasal drip.  She has occasional cough but no shortness of breath, wheezing or chest pain.  No fever or chills.  No nausea or vomiting.   HPI  Past Medical History:  Diagnosis Date   Arthritis    right hand   Chicken pox    Childhood   Diverticulosis of colon (without mention of hemorrhage)    GERD (gastroesophageal reflux disease)    Hiatal hernia    Hyperplastic colon polyp    Mass of finger of right hand 02/2013   right middle finger   Seasonal allergies    Sinus congestion 03/16/2013   cough, stuffy and runny nose   Urine incontinence    Vitamin D deficiency    Wears partial dentures    upper    Patient Active Problem List   Diagnosis Date Noted   Ataxia 05/16/2021   Blurred vision, bilateral 05/16/2021   Stage 3a chronic kidney disease ([place]) 11/02/2020   Hyperplastic colonic polyp 06/29/2020   Cervical cancer screening 06/28/2020   Vitamin D deficiency disease 04/29/2019   Essential hypertension 04/29/2019   Hyperlipidemia with target LDL less than 100 04/29/2019   Bilateral edema of lower extremity 05/27/2017   Routine general medical examination at a health care facility 06/04/2016   Visit for screening mammogram 06/04/2016   Insomnia 08/17/2015   Rectal incontinence 08/17/2015   Pain due to interstitial cystitis 09/16/2014    ESOPHAGITIS, REFLUX 08/14/2007    Past Surgical History:  Procedure Laterality Date   COLONOSCOPY  08/14/2007   562.10, 211.3   FOOT SURGERY Bilateral    exc. ingrown toenail great toe   TUBAL LIGATION  09/02/2002    OB History     Gravida  1   Para  1   Term  1   Preterm      AB      Living  1      SAB      IAB      Ectopic      Multiple      Live Births               Home Medications    Prior to Admission medications   Medication Sig Start Date End Date Taking? Authorizing Provider  fluticasone (FLONASE) 50 MCG/ACT nasal spray Place 1 spray into both nostrils daily. 09/13/21  Yes Maxwel Meadowcroft, Myrene Galas, MD  loratadine (CLARITIN) 10 MG tablet Take 1 tablet (10 mg total) by mouth daily. 09/13/21  Yes Kadie Balestrieri, Myrene Galas, MD  meloxicam (MOBIC) 15 MG tablet Take 1 tablet (15 mg total) by mouth daily. 09/12/21   Felipa Furnace, DPM  rosuvastatin (CRESTOR) 5 MG tablet TAKE 1 TABLET BY MOUTH EVERY DAY 04/18/21   Janith Lima, MD  triamterene-hydrochlorothiazide (  DYAZIDE) 37.5-25 MG capsule TAKE 1 CAPSULE BY MOUTH EVERY DAY 06/29/21   Janith Lima, MD    Family History Family History  Problem Relation Age of Onset   Kidney disease Father    Heart disease Father    Stroke Father    Hypertension Father    Heart disease Mother    Depression Mother    Cancer Sister        lung   Breast cancer Sister        both breast removed   Colon cancer Neg Hx    Esophageal cancer Neg Hx     Social History Social History   Tobacco Use   Smoking status: Former   Smokeless tobacco: Never   Tobacco comments:    quit smoking 20 years ago  Vaping Use   Vaping Use: Never used  Substance Use Topics   Alcohol use: Not Currently    Alcohol/week: 21.0 standard drinks    Types: 21 Glasses of wine per week    Comment: not b/c of gout   Drug use: No     Allergies   Shellfish allergy   Review of Systems Review of Systems  Constitutional: Negative.   HENT:   Positive for congestion and rhinorrhea. Negative for sore throat.   Respiratory:  Positive for cough. Negative for chest tightness, shortness of breath and wheezing.   Cardiovascular:  Negative for chest pain.  Genitourinary: Negative.   Neurological: Negative.     Physical Exam Triage Vital Signs ED Triage Vitals  Enc Vitals Group     BP 09/13/21 1455 130/78     Pulse Rate 09/13/21 1455 72     Resp 09/13/21 1455 19     Temp 09/13/21 1455 98.1 F (36.7 C)     Temp Source 09/13/21 1455 Oral     SpO2 09/13/21 1455 97 %     Weight --      Height --      Head Circumference --      Peak Flow --      Pain Score 09/13/21 1452 9     Pain Loc --      Pain Edu? --      Excl. in [place]? --    No data found.  Updated Vital Signs BP 130/78 (BP Location: Right Arm)   Pulse 72   Temp 98.1 F (36.7 C) (Oral)   Resp 19   SpO2 97%   Visual Acuity Right Eye Distance:   Left Eye Distance:   Bilateral Distance:    Right Eye Near:   Left Eye Near:    Bilateral Near:     Physical Exam Vitals and nursing note reviewed.  Constitutional:      General: She is in acute distress.  HENT:     Right Ear: Tympanic membrane normal.     Left Ear: Tympanic membrane normal.     Mouth/Throat:     Pharynx: No posterior oropharyngeal erythema.  Cardiovascular:     Rate and Rhythm: Normal rate and regular rhythm.     Pulses: Normal pulses.     Heart sounds: Normal heart sounds.  Musculoskeletal:     Comments: Tenderness on palpation over the superior pole of the left patella.  Full range of motion around the knee.  No bruising noted.  Skin:    Capillary Refill: Capillary refill takes less than 2 seconds.  Neurological:     Mental Status: She is alert.  UC Treatments / Results  Labs (all labs ordered are listed, but only abnormal results are displayed) Labs Reviewed - No data to display  EKG   Radiology DG Knee AP/LAT W/[place] Left  Result Date: 09/13/2021 CLINICAL DATA:  Left  knee pain after injury several days ago. EXAM: LEFT KNEE 3 VIEWS COMPARISON:  None. FINDINGS: No evidence of fracture, dislocation, or joint effusion. No evidence of arthropathy or other focal bone abnormality. Soft tissues are unremarkable. IMPRESSION: Negative. Electronically Signed   By: Marijo Conception M.D.   On: 09/13/2021 16:53    Procedures Procedures (including critical care time)  Medications Ordered in UC Medications - No data to display  Initial Impression / Assessment and Plan / UC Course  I have reviewed the triage vital signs and the nursing notes.  Pertinent labs & imaging results that were available during my care of the patient were reviewed by me and considered in my medical decision making (see chart for details).     1.  Left knee sprain: X-ray of the left knee is negative for fracture Left knee brace Ibuprofen as needed for pain Gentle range of motion exercises Return to urgent care if symptoms worsen  2.  Allergic rhinitis: Fluticasone nasal spray Claritin 10 mg orally daily If symptoms worsen please return to urgent care to be reevaluated. Final Clinical Impressions(s) / UC Diagnoses   Final diagnoses:  Allergic sinusitis  Sprain of left knee, unspecified ligament, initial encounter     Discharge Instructions      Gentle range of motion exercises Take medications as prescribed Icing of the left knee is recommended Use knee brace to help with pain If symptoms worsen please return to urgent care to be reevaluated X-rays negative for fracture.   ED Prescriptions     Medication Sig Dispense Auth. Provider   fluticasone (FLONASE) 50 MCG/ACT nasal spray Place 1 spray into both nostrils daily. 16 g Chase Picket, MD   loratadine (CLARITIN) 10 MG tablet Take 1 tablet (10 mg total) by mouth daily. 30 tablet Keria Widrig, Myrene Galas, MD      PDMP not reviewed this encounter.   Chase Picket, MD 09/13/21 [id number]

## 2021-09-13 NOTE — Discharge Instructions (Addendum)
Gentle range of motion exercises Take medications as prescribed Icing of the left knee is recommended Use knee brace to help with pain If symptoms worsen please return to urgent care to be reevaluated X-rays negative for fracture.

## 2021-09-13 NOTE — ED Triage Notes (Signed)
Pt reports last week had cough and congestion. Still having cough sometimes will have phlegm that is thick and clear.  Pt hit left knee on desk on Monday. Using crutches with ambulation from lobby to treatment room. Unsure if has gout or injury

## 2021-09-28 ENCOUNTER — Other Ambulatory Visit: Payer: Self-pay

## 2021-09-28 ENCOUNTER — Ambulatory Visit (INDEPENDENT_AMBULATORY_CARE_PROVIDER_SITE_OTHER): Payer: BC Managed Care – PPO | Admitting: Podiatry

## 2021-09-28 DIAGNOSIS — M7751 Other enthesopathy of right foot: Secondary | ICD-10-CM

## 2021-09-28 MED ORDER — LIDOCAINE 5 % EX OINT: 1.0000 "application " | TOPICAL_OINTMENT | CUTANEOUS | 0 refills | Status: DC | PRN

## 2021-09-28 NOTE — Progress Notes (Signed)
Subjective:  Patient ID: Marie Torres, female    DOB: Aug 26, 1961,  MRN: [id number]  Chief Complaint  Patient presents with   Toe Pain    Right foot great toe     60 y.o. female presents with the above complaint.  Patient presents with complaint of right first MTP capsulitis.  Patient states a mild pain to touch.  Patient has to wear steel toe boots that could have really aggravated the pain.  She states that it is painful to walk on has progressed to gotten worse.  She does not want a steroid shot.  She wanted get evaluated to see if there is any topical medication she can apply.  She has not made any changes to her shoes.  She denies any other acute complaints.   Review of Systems: Negative except as noted in the HPI. Denies N/V/F/Ch.  Past Medical History:  Diagnosis Date   Arthritis    right hand   Chicken pox    Childhood   Diverticulosis of colon (without mention of hemorrhage)    GERD (gastroesophageal reflux disease)    Hiatal hernia    Hyperplastic colon polyp    Mass of finger of right hand 02/2013   right middle finger   Seasonal allergies    Sinus congestion 03/16/2013   cough, stuffy and runny nose   Urine incontinence    Vitamin D deficiency    Wears partial dentures    upper    Current Outpatient Medications:    lidocaine (XYLOCAINE) 5 % ointment, Apply 1 application topically as needed., Disp: 35.44 g, Rfl: 0   fluticasone (FLONASE) 50 MCG/ACT nasal spray, Place 1 spray into both nostrils daily., Disp: 16 g, Rfl: 0   loratadine (CLARITIN) 10 MG tablet, Take 1 tablet (10 mg total) by mouth daily., Disp: 30 tablet, Rfl: 0   meloxicam (MOBIC) 15 MG tablet, Take 1 tablet (15 mg total) by mouth daily., Disp: 30 tablet, Rfl: 0   rosuvastatin (CRESTOR) 5 MG tablet, TAKE 1 TABLET BY MOUTH EVERY DAY, Disp: 90 tablet, Rfl: 1   triamterene-hydrochlorothiazide (DYAZIDE) 37.5-25 MG capsule, TAKE 1 CAPSULE BY MOUTH EVERY DAY, Disp: 90 capsule, Rfl: 0  Social History    Tobacco Use  Smoking Status Former  Smokeless Tobacco Never  Tobacco Comments   quit smoking 20 years ago    Allergies  Allergen Reactions   Shellfish Allergy Hives   Objective:  There were no vitals filed for this visit. There is no height or weight on file to calculate BMI. Constitutional Well developed. Well nourished.  Vascular Dorsalis pedis pulses palpable bilaterally. Posterior tibial pulses palpable bilaterally. Capillary refill normal to all digits.  No cyanosis or clubbing noted. Pedal hair growth normal.  Neurologic Normal speech. Oriented to person, place, and time. Epicritic sensation to light touch grossly present bilaterally.  Dermatologic Nails well groomed and normal in appearance. No open wounds. No skin lesions.  Orthopedic: Pain on palpation of right first metatarsal phalangeal joint.  There is mild hypertrophy that is clinically appreciated of the first MTP.  Range of motion is consistent with hallux limitus.  No intra-articular first MPJ pain noted.   Radiographs: None Assessment:   1. Capsulitis of metatarsophalangeal (MTP) joint of right foot    Plan:  Patient was evaluated and treated and all questions answered.  Right first MTP capsulitis -I explained the patient the etiology of capsulitis and various treatment options were extensively discussed.  I discussed with her she will benefit  from a steroid injection however patient would like to hold off and try topical medication.  I discussed with her Voltaren gel as well as lidocaine gel.  I have asked her to apply this directly to the affected site and see if there is any improvement if there is any improvement we will discuss steroid injection during next clinic visit.  She states understanding. -I discussed shoe gear modification extensive detail as well.  She states understanding  No follow-ups on file.

## 2021-10-01 ENCOUNTER — Other Ambulatory Visit: Payer: Self-pay | Admitting: Internal Medicine

## 2021-10-01 DIAGNOSIS — E785 Hyperlipidemia, unspecified: Secondary | ICD-10-CM

## 2021-10-03 ENCOUNTER — Other Ambulatory Visit: Payer: Self-pay | Admitting: Internal Medicine

## 2021-10-03 DIAGNOSIS — I10 Essential (primary) hypertension: Secondary | ICD-10-CM

## 2021-10-04 ENCOUNTER — Other Ambulatory Visit: Payer: Self-pay | Admitting: Internal Medicine

## 2021-10-04 DIAGNOSIS — Z1231 Encounter for screening mammogram for malignant neoplasm of breast: Secondary | ICD-10-CM

## 2021-11-07 ENCOUNTER — Ambulatory Visit
Admission: RE | Admit: 2021-11-07 | Discharge: 2021-11-07 | Disposition: A | Discharge status: Home / Self Care | Payer: BC Managed Care – PPO | Source: Ambulatory Visit | Attending: Internal Medicine | Admitting: Internal Medicine

## 2021-11-07 ENCOUNTER — Ambulatory Visit: Payer: BC Managed Care – PPO

## 2021-11-07 DIAGNOSIS — Z1231 Encounter for screening mammogram for malignant neoplasm of breast: Secondary | ICD-10-CM

## 2021-11-11 ENCOUNTER — Other Ambulatory Visit: Payer: Self-pay | Admitting: Podiatry

## 2021-11-12 NOTE — Telephone Encounter (Signed)
Please advise 

## 2021-11-21 ENCOUNTER — Other Ambulatory Visit: Payer: Self-pay

## 2021-11-21 ENCOUNTER — Encounter: Payer: Self-pay | Admitting: Internal Medicine

## 2021-11-21 ENCOUNTER — Ambulatory Visit (INDEPENDENT_AMBULATORY_CARE_PROVIDER_SITE_OTHER): Payer: BC Managed Care – PPO | Admitting: Internal Medicine

## 2021-11-21 VITALS — BP 128/86 | HR 90 | Temp 97.8°F | Ht 61.0 in | Wt 162.0 lb

## 2021-11-21 DIAGNOSIS — E785 Hyperlipidemia, unspecified: Secondary | ICD-10-CM

## 2021-11-21 DIAGNOSIS — Z0000 Encounter for general adult medical examination without abnormal findings: Secondary | ICD-10-CM | POA: Diagnosis not present

## 2021-11-21 DIAGNOSIS — R634 Abnormal weight loss: Secondary | ICD-10-CM

## 2021-11-21 DIAGNOSIS — N3281 Overactive bladder: Secondary | ICD-10-CM

## 2021-11-21 DIAGNOSIS — I10 Essential (primary) hypertension: Secondary | ICD-10-CM | POA: Diagnosis not present

## 2021-11-21 DIAGNOSIS — R197 Diarrhea, unspecified: Secondary | ICD-10-CM

## 2021-11-21 DIAGNOSIS — R350 Frequency of micturition: Secondary | ICD-10-CM | POA: Diagnosis not present

## 2021-11-21 DIAGNOSIS — N1831 Chronic kidney disease, stage 3a: Secondary | ICD-10-CM | POA: Diagnosis not present

## 2021-11-21 DIAGNOSIS — K635 Polyp of colon: Secondary | ICD-10-CM

## 2021-11-21 DIAGNOSIS — Z124 Encounter for screening for malignant neoplasm of cervix: Secondary | ICD-10-CM

## 2021-11-21 DIAGNOSIS — R6881 Early satiety: Secondary | ICD-10-CM

## 2021-11-21 LAB — URINALYSIS, ROUTINE W REFLEX MICROSCOPIC
Bilirubin Urine: NEGATIVE
Hgb urine dipstick: NEGATIVE
Ketones, ur: NEGATIVE
Leukocytes,Ua: NEGATIVE
Nitrite: NEGATIVE
RBC / HPF: NONE SEEN (ref 0–?)
Specific Gravity, Urine: 1.02 (ref 1.000–1.030)
Total Protein, Urine: NEGATIVE
Urine Glucose: NEGATIVE
Urobilinogen, UA: 2 — AB (ref 0.0–1.0)
WBC, UA: NONE SEEN (ref 0–?)
pH: 6 (ref 5.0–8.0)

## 2021-11-21 LAB — CBC WITH DIFFERENTIAL/PLATELET
Basophils Absolute: 0.1 10*3/uL (ref 0.0–0.1)
Basophils Relative: 0.8 % (ref 0.0–3.0)
Eosinophils Absolute: 0.3 10*3/uL (ref 0.0–0.7)
Eosinophils Relative: 3.2 % (ref 0.0–5.0)
HCT: 40.4 % (ref 36.0–46.0)
Hemoglobin: 13.5 g/dL (ref 12.0–15.0)
Lymphocytes Relative: 24.2 % (ref 12.0–46.0)
Lymphs Abs: 2 10*3/uL (ref 0.7–4.0)
MCHC: 33.4 g/dL (ref 30.0–36.0)
MCV: 86.6 fl (ref 78.0–100.0)
Monocytes Absolute: 0.8 10*3/uL (ref 0.1–1.0)
Monocytes Relative: 9.8 % (ref 3.0–12.0)
Neutro Abs: 5.1 10*3/uL (ref 1.4–7.7)
Neutrophils Relative %: 62 % (ref 43.0–77.0)
Platelets: 288 10*3/uL (ref 150.0–400.0)
RBC: 4.67 Mil/uL (ref 3.87–5.11)
RDW: 13.6 % (ref 11.5–15.5)
WBC: 8.3 10*3/uL (ref 4.0–10.5)

## 2021-11-21 LAB — BASIC METABOLIC PANEL
BUN: 17 mg/dL (ref 6–23)
CO2: 29 mEq/L (ref 19–32)
Calcium: 9.5 mg/dL (ref 8.4–10.5)
Chloride: 101 mEq/L (ref 96–112)
Creatinine, Ser: 1.06 mg/dL (ref 0.40–1.20)
GFR: 56.28 mL/min — ABNORMAL LOW (ref >60.00)
Glucose, Bld: 79 mg/dL (ref 70–99)
Potassium: 3.7 mEq/L (ref 3.5–5.1)
Sodium: 138 mEq/L (ref 135–145)

## 2021-11-21 LAB — HEPATIC FUNCTION PANEL
ALT: 11 U/L (ref 0–35)
AST: 17 U/L (ref 0–37)
Albumin: 4 g/dL (ref 3.5–5.2)
Alkaline Phosphatase: 57 U/L (ref 39–117)
Bilirubin, Direct: 0 mg/dL (ref 0.0–0.3)
Total Bilirubin: 0.4 mg/dL (ref 0.2–1.2)
Total Protein: 7.9 g/dL (ref 6.0–8.3)

## 2021-11-21 LAB — LIPID PANEL
Cholesterol: 218 mg/dL — ABNORMAL HIGH (ref 0–200)
HDL: 63.8 mg/dL (ref >39.00)
LDL Cholesterol: 117 mg/dL — ABNORMAL HIGH (ref 0–99)
NonHDL: 154.2
Total CHOL/HDL Ratio: 3
Triglycerides: 185 mg/dL — ABNORMAL HIGH (ref 0.0–149.0)
VLDL: 37 mg/dL (ref 0.0–40.0)

## 2021-11-21 LAB — TSH: TSH: 0.97 u[IU]/mL (ref 0.35–5.50)

## 2021-11-21 MED ORDER — ROSUVASTATIN CALCIUM 5 MG PO TABS: 5.0000 mg | ORAL_TABLET | Freq: Every day | ORAL | 1 refills | Status: DC

## 2021-11-21 NOTE — Progress Notes (Signed)
Subjective:  Patient ID: Marie Torres, female    DOB: 08-08-1961  Age: 61 y.o. MRN: [id number]  CC: Annual Exam  This visit occurred during the SARS-CoV-2 public health emergency.  Safety protocols were in place, including screening questions prior to the visit, additional usage of staff PPE, and extensive cleaning of exam room while observing appropriate contact time as indicated for disinfecting solutions.    HPI Marie Torres presents for a CPX and f/up -   She complains of a 53-month history of intermittent, watery diarrhea.  She has lost about 8 pounds unexpectedly.  She complains of early satiety, urinary incontinence, and frequency.  Outpatient Medications Prior to Visit  Medication Sig Dispense Refill   fluticasone (FLONASE) 50 MCG/ACT nasal spray Place 1 spray into both nostrils daily. 16 g 0   lidocaine (XYLOCAINE) 5 % ointment Apply 1 application topically as needed. 35.44 g 0   loratadine (CLARITIN) 10 MG tablet Take 1 tablet (10 mg total) by mouth daily. 30 tablet 0   triamterene-hydrochlorothiazide (DYAZIDE) 37.5-25 MG capsule TAKE 1 CAPSULE BY MOUTH EVERY DAY 90 capsule 0   meloxicam (MOBIC) 15 MG tablet Take 1 tablet (15 mg total) by mouth daily. 30 tablet 0   rosuvastatin (CRESTOR) 5 MG tablet TAKE 1 TABLET BY MOUTH EVERY DAY 90 tablet 1   No facility-administered medications prior to visit.    ROS Review of Systems  Constitutional:  Positive for unexpected weight change. Negative for chills, diaphoresis and fatigue.  HENT: Negative.  Negative for trouble swallowing.   Eyes:  Negative for visual disturbance.  Respiratory:  Negative for cough, chest tightness, shortness of breath and wheezing.   Cardiovascular:  Negative for chest pain, palpitations and leg swelling.  Gastrointestinal:  Positive for diarrhea. Negative for anal bleeding, blood in stool, nausea and vomiting.  Endocrine: Negative.   Genitourinary:  Positive for frequency. Negative for difficulty  urinating, dysuria, hematuria, pelvic pain and urgency.  Musculoskeletal: Negative.  Negative for arthralgias and myalgias.  Skin: Negative.   Neurological:  Negative for dizziness, weakness, light-headedness and numbness.  Hematological:  Negative for adenopathy. Does not bruise/bleed easily.  Psychiatric/Behavioral: Negative.     Objective:  BP 128/86 (BP Location: Left Arm, Patient Position: Sitting, Cuff Size: Large)    Pulse 90    Temp 97.8 F (36.6 C) (Oral)    Ht 5\' 1"  (1.549 m)    Wt 162 lb (73.5 kg)    SpO2 96%    BMI 30.61 kg/m   BP Readings from Last 3 Encounters:  11/21/21 128/86  09/13/21 130/78  05/16/21 140/82    Wt Readings from Last 3 Encounters:  11/21/21 162 lb (73.5 kg)  05/16/21 174 lb (78.9 kg)  01/19/21 169 lb (76.7 kg)    Physical Exam Vitals reviewed.  Constitutional:      Appearance: Normal appearance.  HENT:     Nose: Nose normal.     Mouth/Throat:     Mouth: Mucous membranes are moist.  Eyes:     General: No scleral icterus.    Conjunctiva/sclera: Conjunctivae normal.  Cardiovascular:     Rate and Rhythm: Normal rate and regular rhythm.     Pulses: Normal pulses.     Heart sounds: No murmur heard. Pulmonary:     Effort: Pulmonary effort is normal.     Breath sounds: No stridor. No wheezing, rhonchi or rales.  Abdominal:     General: Abdomen is flat.     Palpations: There  is no mass.     Tenderness: There is no abdominal tenderness. There is no guarding or rebound.     Hernia: No hernia is present.  Musculoskeletal:        General: Normal range of motion.     Cervical back: Neck supple.     Right lower leg: No edema.     Left lower leg: No edema.  Lymphadenopathy:     Cervical: No cervical adenopathy.  Skin:    General: Skin is warm and dry.     Coloration: Skin is not pale.  Neurological:     General: No focal deficit present.     Mental Status: She is alert. Mental status is at baseline.  Psychiatric:        Mood and Affect:  Mood normal.        Behavior: Behavior normal.    Lab Results  Component Value Date   WBC 8.3 11/21/2021   HGB 13.5 11/21/2021   HCT 40.4 11/21/2021   PLT 288.0 11/21/2021   GLUCOSE 79 11/21/2021   CHOL 218 (H) 11/21/2021   TRIG 185.0 (H) 11/21/2021   HDL 63.80 11/21/2021   LDLDIRECT 150.8 11/30/2013   LDLCALC 117 (H) 11/21/2021   ALT 11 11/21/2021   AST 17 11/21/2021   NA 138 11/21/2021   K 3.7 11/21/2021   CL 101 11/21/2021   CREATININE 1.06 11/21/2021   BUN 17 11/21/2021   CO2 29 11/21/2021   TSH 0.97 11/21/2021   HGBA1C 5.7 04/29/2019    MM 3D SCREEN BREAST BILATERAL  Result Date: 11/07/2021 CLINICAL DATA:  Screening. EXAM: DIGITAL SCREENING BILATERAL MAMMOGRAM WITH TOMOSYNTHESIS AND CAD TECHNIQUE: Bilateral screening digital craniocaudal and mediolateral oblique mammograms were obtained. Bilateral screening digital breast tomosynthesis was performed. The images were evaluated with computer-aided detection. COMPARISON:  Previous exam(s). ACR Breast Density Category b: There are scattered areas of fibroglandular density. FINDINGS: There are no findings suspicious for malignancy. IMPRESSION: No mammographic evidence of malignancy. A result letter of this screening mammogram will be mailed directly to the patient. RECOMMENDATION: Screening mammogram in one year. (Code:SM-B-01Y) BI-RADS CATEGORY  1: Negative. Electronically Signed   By: Nolon Nations M.D.   On: 11/07/2021 16:21    Assessment & Plan:   Marie Torres was seen today for annual exam.  Diagnoses and all orders for this visit:  Essential hypertension- Her blood pressure is adequately well controlled. -     Basic metabolic panel; Future -     Urinalysis, Routine w reflex microscopic; Future -     TSH; Future -     CBC with Differential/Platelet; Future -     Basic metabolic panel; Future -     Basic metabolic panel -     CBC with Differential/Platelet -     TSH -     Urinalysis, Routine w reflex microscopic -      Basic metabolic panel  Stage 3a chronic kidney disease ([place])- Her renal function is stable.  She will avoid nephrotoxic agents. -     Basic metabolic panel; Future -     Urinalysis, Routine w reflex microscopic; Future -     CBC with Differential/Platelet; Future -     Basic metabolic panel; Future -     Basic metabolic panel -     CBC with Differential/Platelet -     Urinalysis, Routine w reflex microscopic -     Basic metabolic panel  Routine general medical examination at a health care facility- Exam  completed, labs reviewed, vaccines reviewed:she refused a flu vaccine, cancer screenings addressed, patient education was given. -     Cancel: Lipid panel; Future -     Lipid panel; Future -     Lipid panel  Hyperlipidemia with target LDL less than 100- LDL goal achieved. Doing well on the statin . -     TSH; Future -     Hepatic function panel; Future -     rosuvastatin (CRESTOR) 5 MG tablet; Take 1 tablet (5 mg total) by mouth daily. -     Hepatic function panel -     TSH  Urinary frequency- I will screen for infection and will treat for OAB. -     Urinalysis, Routine w reflex microscopic; Future -     CULTURE, URINE COMPREHENSIVE; Future -     CULTURE, URINE COMPREHENSIVE -     Urinalysis, Routine w reflex microscopic  Diarrhea, unspecified type- Will screen for IBD and pancreatic insufficiency. -     Pancreatic elastase, fecal; Future -     Fecal lactoferrin, quant; Future -     Fecal lactoferrin, quant -     Pancreatic elastase, fecal  Weight loss, non-intentional -     Pancreatic elastase, fecal; Future -     Fecal lactoferrin, quant; Future -     Fecal lactoferrin, quant -     Pancreatic elastase, fecal  Hyperplastic colonic polyp, unspecified part of colon -     Cancel: Ambulatory referral to Gastroenterology  Cervical cancer screening -     Ambulatory referral to Gynecology  Early satiety -     Ambulatory referral to Gastroenterology  OAB (overactive  bladder) -     solifenacin (VESICARE) 10 MG tablet; Take 1 tablet (10 mg total) by mouth daily.   I have discontinued Marie Torres "[name]"'s meloxicam. I have also changed her rosuvastatin. Additionally, I am having her start on solifenacin. Lastly, I am having her maintain her fluticasone, loratadine, lidocaine, and triamterene-hydrochlorothiazide.  Meds ordered this encounter  Medications   rosuvastatin (CRESTOR) 5 MG tablet    Sig: Take 1 tablet (5 mg total) by mouth daily.    Dispense:  90 tablet    Refill:  1    DX Code Needed  .   solifenacin (VESICARE) 10 MG tablet    Sig: Take 1 tablet (10 mg total) by mouth daily.    Dispense:  90 tablet    Refill:  1     Follow-up: Return in about 3 months (around 02/19/2022).  Scarlette Calico, MD

## 2021-11-21 NOTE — Patient Instructions (Signed)
Chronic Diarrhea Chronic diarrhea is a condition in which a person passes frequent loose and watery stools for 4 weeks or longer. Non-chronic diarrhea usually lasts foronly 2-3 days. Diarrhea can cause a person to feel weak and dehydrated. Dehydration can make the person tired and thirsty. It can also cause a dry mouth, decreased urination, and dark yellow urine. Diarrhea is a sign of an underlying problem, such as: Infection. Side effects of medicines. Problems digesting something in your diet, such as milk products if you have lactose intolerance. Conditions such as celiac disease, irritable bowel syndrome (IBS), or inflammatory bowel disease (IBD). If you have chronic diarrhea, make sure you treat it as told by your healthcare provider. Follow these instructions at home: Medicines Take over-the-counter and prescription medicines only as told by your health care provider. If you were prescribed an antibiotic medicine, take it as told by your health care provider. Do not stop taking the antibiotic even if you start to feel better. Eating and drinking  Follow instructions from your health care provider about what to eat and drink. You may have to: Avoid foods that trigger diarrhea for you. Take an oral rehydration solution (ORS). This is a drink that keeps you hydrated. It can be found at pharmacies and retail stores. Drink clear fluids, such as water, diluted fruit juice, and low-calorie sports drinks. You can also get fluids by sucking on ice chips. Drink enough fluid to keep your urine pale yellow. This will help you avoid dehydration. Eat small amounts of bland foods that are easy to digest as you are able. These foods include bananas, applesauce, rice, lean meats, toast, and crackers. Avoid spicy or fatty foods. Avoid foods and beverages that contain a lot of sugar or caffeine. Do not drink alcohol if: Your health care provider tells you not to drink. You are pregnant, may be pregnant,  or are planning to become pregnant. If you drink alcohol: Limit how much you use to: 0-1 drink a day for women. 0-2 drinks a day for men. Be aware of how much alcohol is in your drink. In the [place] one drink equals one 12 oz bottle of beer (355 mL), one 5 oz glass of wine (148 mL), or one 1 oz glass of hard liquor (44 mL).  General instructions  Wash your hands often and after each diarrhea episode. Use soap and water. If soap and water are not available, use hand sanitizer. Make sure that all people in your household wash their hands well and often. Rest as told by your health care provider. Watch your condition for any changes. Take a warm bath to relieve any burning or pain from frequent diarrhea episodes. Keep all follow-up visits as told by your health care provider. This is important.  Contact a health care provider if: You have a fever. Your diarrhea gets worse or does not get better. You have new symptoms. You cannot drink fluid without vomiting. You feel light-headed or dizzy. You have a headache. You have muscle cramps. You have severe pain in the rectum. Get help right away if: You have vomiting that does not go away. You have chest pain. You feel very weak or you faint. You have bloody or black stools, or stools that look like tar. You have severe pain, cramping, or bloating in your abdomen, or pain that stays in one place. You have trouble breathing or you are breathing very quickly. Your heart is beating very quickly. Your skin feels cold and clammy. You   feel confused. You have a severe headache. You have signs or symptoms of dehydration, such as: Dark urine, very little urine, or no urine. Cracked lips. Dry mouth. Sunken eyes. Sleepiness. Weakness. These symptoms may represent a serious problem that is an emergency. Do not wait to see if the symptoms will go away. Get medical help right away. Call your local emergency services (911 in the [place] Do not drive  yourself to the hospital. Summary Chronic diarrhea is a condition in which a person passes frequent loose and watery stools for 4 weeks or longer. Diarrhea is a sign of an underlying problem. Make sure you treat your diarrhea as told by your health care provider. Drink enough fluid to keep your urine pale yellow. This will help you avoid dehydration. Wash your hands often and after each diarrhea episode. If soap and water are not available, use hand sanitizer. This information is not intended to replace advice given to you by your health care provider. Make sure you discuss any questions you have with your healthcare provider. Document Revised: 04/12/2019 Document Reviewed: 04/12/2019 Elsevier Patient Education  2022 Else[employer] Inc.  

## 2021-11-22 ENCOUNTER — Encounter: Payer: Self-pay | Admitting: Internal Medicine

## 2021-11-22 DIAGNOSIS — N3281 Overactive bladder: Secondary | ICD-10-CM | POA: Insufficient documentation

## 2021-11-22 DIAGNOSIS — R6881 Early satiety: Secondary | ICD-10-CM | POA: Insufficient documentation

## 2021-11-22 MED ORDER — SOLIFENACIN SUCCINATE 10 MG PO TABS: 10.0000 mg | ORAL_TABLET | Freq: Every day | ORAL | 1 refills | Status: DC

## 2021-11-27 ENCOUNTER — Encounter: Payer: Self-pay | Admitting: Internal Medicine

## 2021-11-27 LAB — CULTURE, URINE COMPREHENSIVE

## 2021-11-27 LAB — PANCREATIC ELASTASE, FECAL

## 2021-11-27 LAB — FECAL LACTOFERRIN, QUANT

## 2021-12-12 ENCOUNTER — Encounter (HOSPITAL_COMMUNITY): Payer: Self-pay

## 2021-12-12 ENCOUNTER — Ambulatory Visit (HOSPITAL_COMMUNITY)
Admission: EM | Admit: 2021-12-12 | Discharge: 2021-12-12 | Disposition: A | Discharge status: Home / Self Care | Payer: BC Managed Care – PPO | Attending: Family Medicine | Admitting: Family Medicine

## 2021-12-12 ENCOUNTER — Other Ambulatory Visit: Payer: Self-pay

## 2021-12-12 DIAGNOSIS — B9689 Other specified bacterial agents as the cause of diseases classified elsewhere: Secondary | ICD-10-CM

## 2021-12-12 DIAGNOSIS — J019 Acute sinusitis, unspecified: Secondary | ICD-10-CM | POA: Diagnosis not present

## 2021-12-12 LAB — CULTURE, URINE COMPREHENSIVE

## 2021-12-12 LAB — FECAL LACTOFERRIN, QUANT: LACTOFERRIN, QL, STOOL: NEGATIVE

## 2021-12-12 LAB — PANCREATIC ELASTASE, FECAL: Pancreatic Elastase-1, Stool: >500 mcg/g

## 2021-12-12 MED ORDER — AMOXICILLIN-POT CLAVULANATE 875-125 MG PO TABS: 1.0000 | ORAL_TABLET | Freq: Two times a day (BID) | ORAL | 0 refills | Status: DC

## 2021-12-12 NOTE — ED Provider Notes (Signed)
[place]    CSN: [id number] Arrival date & time: 12/12/21  0849      History   Chief Complaint Chief Complaint  Patient presents with   Cough    HPI Marie Torres is a 61 y.o. female.   Cough, congestion Started 7 days ago No fevers, but started having chills yesterday Felt like she was doing better until yesterday and started to get worse Endorses congestion, rhinorrhea, sinus pressure, loose, nonbloody stools  Denies sore throat, headache, fatigue, myalgias, nausea, vomiting, chest pain, shortness of breath Has been drinking normally, has normal UOP  No known sick contacts  Has not been tested for COVID     Past Medical History:  Diagnosis Date   Arthritis    right hand   Chicken pox    Childhood   Diverticulosis of colon (without mention of hemorrhage)    GERD (gastroesophageal reflux disease)    Hiatal hernia    Hyperplastic colon polyp    Mass of finger of right hand 02/2013   right middle finger   Seasonal allergies    Sinus congestion 03/16/2013   cough, stuffy and runny nose   Urine incontinence    Vitamin D deficiency    Wears partial dentures    upper    Patient Active Problem List   Diagnosis Date Noted   Early satiety 11/22/2021   OAB (overactive bladder) 11/22/2021   Urinary frequency 11/21/2021   Diarrhea 11/21/2021   Weight loss, non-intentional 11/21/2021   Stage 3a chronic kidney disease ([place]) 11/02/2020   Hyperplastic colonic polyp 06/29/2020   Cervical cancer screening 06/28/2020   Vitamin D deficiency disease 04/29/2019   Essential hypertension 04/29/2019   Hyperlipidemia with target LDL less than 100 04/29/2019   Bilateral edema of lower extremity 05/27/2017   Routine general medical examination at a health care facility 06/04/2016   Visit for screening mammogram 06/04/2016   Insomnia 08/17/2015   Rectal incontinence 08/17/2015   Pain due to interstitial cystitis 09/16/2014   ESOPHAGITIS, REFLUX 08/14/2007     Past Surgical History:  Procedure Laterality Date   COLONOSCOPY  08/14/2007   562.10, 211.3   FOOT SURGERY Bilateral    exc. ingrown toenail great toe   TUBAL LIGATION  09/02/2002    OB History     Gravida  1   Para  1   Term  1   Preterm      AB      Living  1      SAB      IAB      Ectopic      Multiple      Live Births               Home Medications    Prior to Admission medications   Medication Sig Start Date End Date Taking? Authorizing Provider  amoxicillin-clavulanate (AUGMENTIN) 875-125 MG tablet Take 1 tablet by mouth every 12 (twelve) hours. 12/12/21  Yes Esley Brooking, Bernita Raisin, DO  fluticasone (FLONASE) 50 MCG/ACT nasal spray Place 1 spray into both nostrils daily. 09/13/21   Lamptey, Myrene Galas, MD  lidocaine (XYLOCAINE) 5 % ointment Apply 1 application topically as needed. 09/28/21   Felipa Furnace, DPM  loratadine (CLARITIN) 10 MG tablet Take 1 tablet (10 mg total) by mouth daily. 09/13/21   Lamptey, Myrene Galas, MD  rosuvastatin (CRESTOR) 5 MG tablet Take 1 tablet (5 mg total) by mouth daily. 11/21/21   Janith Lima, MD  solifenacin (VESICARE) 10 MG tablet Take 1 tablet (10 mg total) by mouth daily. 11/22/21   Janith Lima, MD  triamterene-hydrochlorothiazide (DYAZIDE) 37.5-25 MG capsule TAKE 1 CAPSULE BY MOUTH EVERY DAY 10/03/21   Janith Lima, MD    Family History Family History  Problem Relation Age of Onset   Kidney disease Father    Heart disease Father    Stroke Father    Hypertension Father    Heart disease Mother    Depression Mother    Cancer Sister        lung   Breast cancer Sister        both breast removed   Colon cancer Neg Hx    Esophageal cancer Neg Hx     Social History Social History   Tobacco Use   Smoking status: Former   Smokeless tobacco: Never   Tobacco comments:    quit smoking 20 years ago  Vaping Use   Vaping Use: Never used  Substance Use Topics   Alcohol use: Not Currently    Alcohol/week:  14.0 standard drinks    Types: 14 Glasses of wine per week    Comment: not b/c of gout   Drug use: No     Allergies   Shellfish allergy   Review of Systems Review of Systems  All other systems reviewed and are negative.  Per HPI Physical Exam Triage Vital Signs ED Triage Vitals  Enc Vitals Group     BP 12/12/21 0935 137/85     Pulse Rate 12/12/21 0935 80     Resp 12/12/21 0935 17     Temp 12/12/21 0935 97.9 F (36.6 C)     Temp Source 12/12/21 0935 Oral     SpO2 12/12/21 0935 98 %     Weight --      Height --      Head Circumference --      Peak Flow --      Pain Score 12/12/21 0934 4     Pain Loc --      Pain Edu? --      Excl. in [place]? --    No data found.  Updated Vital Signs BP 137/85 (BP Location: Left Arm)    Pulse 80    Temp 97.9 F (36.6 C) (Oral)    Resp 17    SpO2 98%   Visual Acuity Right Eye Distance:   Left Eye Distance:   Bilateral Distance:    Right Eye Near:   Left Eye Near:    Bilateral Near:     Physical Exam Constitutional:      General: She is not in acute distress.    Appearance: She is well-developed. She is not ill-appearing or toxic-appearing.  HENT:     Head: Normocephalic and atraumatic.     Right Ear: Tympanic membrane, ear canal and external ear normal.     Left Ear: Tympanic membrane, ear canal and external ear normal.     Nose: Congestion and rhinorrhea present.     Mouth/Throat:     Mouth: Mucous membranes are moist.     Pharynx: No oropharyngeal exudate or posterior oropharyngeal erythema.  Eyes:     Conjunctiva/sclera: Conjunctivae normal.  Cardiovascular:     Rate and Rhythm: Normal rate.  Pulmonary:     Effort: Pulmonary effort is normal. No respiratory distress.     Breath sounds: Normal breath sounds. No wheezing, rhonchi or rales.  Musculoskeletal:     Cervical  back: Neck supple. No rigidity or tenderness.     Right lower leg: No edema.     Left lower leg: No edema.  Lymphadenopathy:     Cervical: No  cervical adenopathy.  Skin:    General: Skin is warm and dry.     Capillary Refill: Capillary refill takes less than 2 seconds.  Neurological:     Mental Status: She is alert and oriented to person, place, and time.     UC Treatments / Results  Labs (all labs ordered are listed, but only abnormal results are displayed) Labs Reviewed - No data to display  EKG   Radiology No results found.  Procedures Procedures (including critical care time)  Medications Ordered in UC Medications - No data to display  Initial Impression / Assessment and Plan / UC Course  I have reviewed the triage vital signs and the nursing notes.  Pertinent labs & imaging results that were available during my care of the patient were reviewed by me and considered in my medical decision making (see chart for details).     VSS.  COVID and influenza deferred given 1 week of symptoms. Will treat with augmentin.  Lung exam clear, no evidence of pneumonia.  Advised of OTC treatments and ED precautions, see AVS.    Final Clinical Impressions(s) / UC Diagnoses   Final diagnoses:  Acute bacterial rhinosinusitis     Discharge Instructions      I have sent an antibiotic to the pharmacy for a sinus infection.  Take this twice a day for 5 days. If you have difficulty breathing, chest pain, you are vomiting and can't keep any liquids down and you aren't urinating at least 50% of your normal amount, you should be seen at the emergency room right away.  If you aren't improving over the next week, please follow up with your regular medical provider.  For your congestion, you can use nasal saline spray.  You can also use a humidifier.  You can also use honey as needed for cough by the spoonful or in a warm liquid (do not give honey to an infant less than a year old).      ED Prescriptions     Medication Sig Dispense Auth. Provider   amoxicillin-clavulanate (AUGMENTIN) 875-125 MG tablet Take 1 tablet by mouth  every 12 (twelve) hours. 10 tablet Justinn Welter, Bernita Raisin, DO      PDMP not reviewed this encounter.   [place], Bernita Raisin, DO 12/12/21 [phone]

## 2021-12-12 NOTE — Discharge Instructions (Signed)
I have sent an antibiotic to the pharmacy for a sinus infection.  Take this twice a day for 5 days. If you have difficulty breathing, chest pain, you are vomiting and can't keep any liquids down and you aren't urinating at least 50% of your normal amount, you should be seen at the emergency room right away.  If you aren't improving over the next week, please follow up with your regular medical provider.  For your congestion, you can use nasal saline spray.  You can also use a humidifier.  You can also use honey as needed for cough by the spoonful or in a warm liquid (do not give honey to an infant less than a year old).

## 2021-12-12 NOTE — ED Triage Notes (Signed)
Pt presents with c/o cough, chills and body aches X 1 week.

## 2021-12-30 ENCOUNTER — Other Ambulatory Visit: Payer: Self-pay | Admitting: Internal Medicine

## 2021-12-30 DIAGNOSIS — I10 Essential (primary) hypertension: Secondary | ICD-10-CM

## 2022-01-10 ENCOUNTER — Other Ambulatory Visit (HOSPITAL_COMMUNITY)
Admission: RE | Admit: 2022-01-10 | Discharge: 2022-01-10 | Disposition: A | Discharge status: Home / Self Care | Payer: BC Managed Care – PPO | Source: Ambulatory Visit | Attending: Obstetrics and Gynecology | Admitting: Obstetrics and Gynecology

## 2022-01-10 ENCOUNTER — Encounter: Payer: Self-pay | Admitting: Obstetrics and Gynecology

## 2022-01-10 ENCOUNTER — Ambulatory Visit (INDEPENDENT_AMBULATORY_CARE_PROVIDER_SITE_OTHER): Payer: BC Managed Care – PPO | Admitting: Obstetrics and Gynecology

## 2022-01-10 ENCOUNTER — Other Ambulatory Visit: Payer: Self-pay

## 2022-01-10 VITALS — BP 123/76 | HR 87 | Ht 61.0 in | Wt 160.1 lb

## 2022-01-10 DIAGNOSIS — N9089 Other specified noninflammatory disorders of vulva and perineum: Secondary | ICD-10-CM | POA: Diagnosis not present

## 2022-01-10 DIAGNOSIS — Z01419 Encounter for gynecological examination (general) (routine) without abnormal findings: Secondary | ICD-10-CM

## 2022-01-10 DIAGNOSIS — Z113 Encounter for screening for infections with a predominantly sexual mode of transmission: Secondary | ICD-10-CM | POA: Insufficient documentation

## 2022-01-10 DIAGNOSIS — N3281 Overactive bladder: Secondary | ICD-10-CM

## 2022-01-10 LAB — HM PAP SMEAR

## 2022-01-10 NOTE — Progress Notes (Signed)
? ? ?GYNECOLOGY ANNUAL PREVENTATIVE CARE ENCOUNTER NOTE ? ?History:    ? Marie Torres is a 61 y.o. G34P1001 female here for a routine annual gynecologic exam.  Current complaints: early satiety and weight loss over the last 6 months.   Denies abnormal vaginal bleeding, discharge, pelvic pain, problems with intercourse or other gynecologic concerns.  ? Pt is postmenopausal since age 40.  She denies vasomotor symptoms.  She notes increased satiety and weight loss over the last year.  Pt notes nocturia x 1-2, but still wears depends garments due to intense urge symptoms while awake. ? ?Gynecologic History ?No LMP recorded. Patient is postmenopausal. ?Contraception: post menopausal status ?Last Pap: 01/31/14. Results were: normal with negative HPV ?Last mammogram: 11/07/21. Results were: normal ? ?Obstetric History ?OB History  ?Gravida Para Term Preterm AB Living  ?'1 1 1     1  '$ ?SAB IAB Ectopic Multiple Live Births  ?           ?  ?# Outcome Date GA Lbr Len/2nd Weight Sex Delivery Anes PTL Lv  ?1 Term      Vag-Spont     ? ? ?Past Medical History:  ?Diagnosis Date  ? Arthritis   ? right hand  ? Chicken pox   ? Childhood  ? Diverticulosis of colon (without mention of hemorrhage)   ? GERD (gastroesophageal reflux disease)   ? Hiatal hernia   ? Hyperplastic colon polyp   ? Mass of finger of right hand 02/2013  ? right middle finger  ? Seasonal allergies   ? Sinus congestion 03/16/2013  ? cough, stuffy and runny nose  ? Urine incontinence   ? Vitamin D deficiency   ? Wears partial dentures   ? upper  ? ? ?Past Surgical History:  ?Procedure Laterality Date  ? COLONOSCOPY  08/14/2007  ? 562.10, 211.3  ? FOOT SURGERY Bilateral   ? exc. ingrown toenail great toe  ? TUBAL LIGATION  09/02/2002  ? ? ?Current Outpatient Medications on File Prior to Visit  ?Medication Sig Dispense Refill  ? fluticasone (FLONASE) 50 MCG/ACT nasal spray Place 1 spray into both nostrils daily. 16 g 0  ? lidocaine (XYLOCAINE) 5 % ointment Apply 1  application topically as needed. 35.44 g 0  ? loratadine (CLARITIN) 10 MG tablet Take 1 tablet (10 mg total) by mouth daily. 30 tablet 0  ? rosuvastatin (CRESTOR) 5 MG tablet Take 1 tablet (5 mg total) by mouth daily. 90 tablet 1  ? solifenacin (VESICARE) 10 MG tablet Take 1 tablet (10 mg total) by mouth daily. 90 tablet 1  ? amoxicillin-clavulanate (AUGMENTIN) 875-125 MG tablet Take 1 tablet by mouth every 12 (twelve) hours. (Patient not taking: Reported on 01/10/2022) 10 tablet 0  ? triamterene-hydrochlorothiazide (DYAZIDE) 37.5-25 MG capsule TAKE 1 CAPSULE BY MOUTH EVERY DAY (Patient not taking: Reported on 01/10/2022) 90 capsule 0  ? ?No current facility-administered medications on file prior to visit.  ? ? ?Allergies  ?Allergen Reactions  ? Shellfish Allergy Hives  ? ? ?Social History:  reports that she has quit smoking. She has never used smokeless tobacco. She reports that she does not currently use alcohol after a past usage of about 14.0 standard drinks per week. She reports that she does not use drugs. ? ?Family History  ?Problem Relation Age of Onset  ? Kidney disease Father   ? Heart disease Father   ? Stroke Father   ? Hypertension Father   ? Heart disease Mother   ?  Depression Mother   ? Cancer Sister   ?     lung  ? Breast cancer Sister   ?     both breast removed  ? Colon cancer Neg Hx   ? Esophageal cancer Neg Hx   ? ? ?The following portions of the patient's history were reviewed and updated as appropriate: allergies, current medications, past family history, past medical history, past social history, past surgical history and problem list. ? ?Review of Systems ?Pertinent items noted in HPI and remainder of comprehensive ROS otherwise negative. ? ?Physical Exam:  ?BP 123/76   Pulse 87   Ht '5\' 1"'$  (1.549 m)   Wt 160 lb 1.6 oz (72.6 kg)   BMI 30.25 kg/m?  ?CONSTITUTIONAL: Well-developed, well-nourished female in no acute distress.  ?HENT:  Normocephalic, atraumatic, External right and left ear  normal. Oropharynx is clear and moist ?EYES: Conjunctivae and EOM are normal.  ?NECK: Normal range of motion, supple, no masses.  Normal thyroid.  ?SKIN: Skin is warm and dry. No rash noted. Not diaphoretic. No erythema. No pallor. ?MUSCULOSKELETAL: Normal range of motion. No tenderness.  No cyanosis, clubbing, or edema.  2+ distal pulses. ?NEUROLOGIC: Alert and oriented to person, place, and time. Normal reflexes, muscle tone coordination.  ?PSYCHIATRIC: Normal mood and affect. Normal behavior. Normal judgment and thought content. ?CARDIOVASCULAR: Normal heart rate noted, regular rhythm ?RESPIRATORY: Clear to auscultation bilaterally. Effort and breath sounds normal, no problems with respiration noted. ?BREASTS: deferred ?ABDOMEN: Soft, no distention noted.  No tenderness, rebound or guarding.  ?PELVIC: Normal appearing external genitalia and urethral meatus; normal appearing vaginal mucosa and cervix.  No abnormal discharge noted.  Pap smear obtained.  Normal uterine size, no other palpable masses, no uterine or adnexal tenderness.  Performed in the presence of a chaperone. ?  ?Assessment and Plan:  ?  1. Women's annual routine gynecological examination ?Normal annual exam. ?Reviewed chart note from PCP, he is evaluating the early satiety and is referring the patient to gastroenterology ?- Cytology - PAP ? ?2. Routine screening for STI (sexually transmitted infection) ? ?- RPR ?- Cervicovaginal ancillary only( [place]) ?- HepB+HepC+HIV Panel ? ?3. OAB (overactive bladder) ?PT recently was prescribed vesicare, is symptoms worsen, consider urogynecology referral ? ?4. Vulvar lesion ?Small lesion noted on vulva, culture taken ? ?- Herpes simplex virus culture ? ?Will follow up results of pap smear and manage accordingly. ?Routine preventative health maintenance measures emphasized. ?Please refer to After Visit Summary for other counseling recommendations.  ?   ? ?Lynnda Shields, MD, FACOG ?Obstetrician Insurance underwriter, Faculty Practice ?[facility]  ?

## 2022-01-10 NOTE — Progress Notes (Signed)
New GYN pt in office to establish care. Pt is requesting STI testing. She has concerns about rapid weight loss over the last 2 years but no other concerns at this time. ? ?Last mammogram:11-07-21 ?Last colonoscopy:12-08-13 ?

## 2022-01-11 LAB — CERVICOVAGINAL ANCILLARY ONLY
Bacterial Vaginitis (gardnerella): NEGATIVE
Candida Glabrata: NEGATIVE
Candida Vaginitis: NEGATIVE
Chlamydia: NEGATIVE
Comment: NEGATIVE
Comment: NEGATIVE
Comment: NEGATIVE
Comment: NEGATIVE
Comment: NEGATIVE
Comment: NORMAL
Neisseria Gonorrhea: NEGATIVE
Trichomonas: NEGATIVE

## 2022-01-11 LAB — HEPB+HEPC+HIV PANEL
HIV Screen 4th Generation wRfx: NONREACTIVE
Hep B C IgM: NEGATIVE
Hep B Core Total Ab: NEGATIVE
Hep B E Ab: NEGATIVE
Hep B E Ag: POSITIVE — AB
Hep B Surface Ab, Qual: NONREACTIVE
Hep C Virus Ab: NONREACTIVE
Hepatitis B Surface Ag: NEGATIVE

## 2022-01-11 LAB — RPR: RPR Ser Ql: NONREACTIVE

## 2022-01-13 LAB — HERPES SIMPLEX VIRUS CULTURE

## 2022-01-15 ENCOUNTER — Telehealth: Payer: Self-pay

## 2022-01-15 NOTE — Telephone Encounter (Signed)
Attempted to contact about results, no answer, left vm 

## 2022-01-16 ENCOUNTER — Telehealth: Payer: Self-pay

## 2022-01-16 DIAGNOSIS — Z113 Encounter for screening for infections with a predominantly sexual mode of transmission: Secondary | ICD-10-CM

## 2022-01-16 LAB — CYTOLOGY - PAP
Adequacy: ABSENT
Comment: NEGATIVE
Diagnosis: UNDETERMINED — AB
High risk HPV: NEGATIVE

## 2022-01-16 NOTE — Telephone Encounter (Signed)
S/w pt and advised of results, provider recommendations, and referral. ?

## 2022-01-17 ENCOUNTER — Telehealth: Payer: Self-pay

## 2022-01-17 NOTE — Telephone Encounter (Signed)
S/w pt and advised of pap results ?

## 2022-01-18 ENCOUNTER — Other Ambulatory Visit (HOSPITAL_COMMUNITY): Payer: Self-pay

## 2022-01-22 ENCOUNTER — Encounter: Payer: Self-pay | Admitting: Internal Medicine

## 2022-01-22 ENCOUNTER — Other Ambulatory Visit: Payer: Self-pay

## 2022-01-22 ENCOUNTER — Ambulatory Visit (INDEPENDENT_AMBULATORY_CARE_PROVIDER_SITE_OTHER): Payer: BC Managed Care – PPO | Admitting: Internal Medicine

## 2022-01-22 VITALS — BP 115/75 | HR 71 | Temp 97.5°F | Resp 16 | Ht 61.0 in | Wt 158.8 lb

## 2022-01-22 VITALS — BP 122/74 | HR 77 | Temp 98.1°F | Ht 61.0 in | Wt 157.0 lb

## 2022-01-22 DIAGNOSIS — Z1159 Encounter for screening for other viral diseases: Secondary | ICD-10-CM

## 2022-01-22 DIAGNOSIS — N1831 Chronic kidney disease, stage 3a: Secondary | ICD-10-CM | POA: Diagnosis not present

## 2022-01-22 DIAGNOSIS — R0609 Other forms of dyspnea: Secondary | ICD-10-CM

## 2022-01-22 DIAGNOSIS — I10 Essential (primary) hypertension: Secondary | ICD-10-CM

## 2022-01-22 DIAGNOSIS — R899 Unspecified abnormal finding in specimens from other organs, systems and tissues: Secondary | ICD-10-CM | POA: Diagnosis not present

## 2022-01-22 LAB — CBC WITH DIFFERENTIAL/PLATELET
Basophils Absolute: 0 10*3/uL (ref 0.0–0.1)
Basophils Relative: 0.6 % (ref 0.0–3.0)
Eosinophils Absolute: 0.1 10*3/uL (ref 0.0–0.7)
Eosinophils Relative: 2 % (ref 0.0–5.0)
HCT: 37.4 % (ref 36.0–46.0)
Hemoglobin: 12.8 g/dL (ref 12.0–15.0)
Lymphocytes Relative: 29.7 % (ref 12.0–46.0)
Lymphs Abs: 2.2 10*3/uL (ref 0.7–4.0)
MCHC: 34.3 g/dL (ref 30.0–36.0)
MCV: 85.5 fl (ref 78.0–100.0)
Monocytes Absolute: 0.6 10*3/uL (ref 0.1–1.0)
Monocytes Relative: 7.9 % (ref 3.0–12.0)
Neutro Abs: 4.4 10*3/uL (ref 1.4–7.7)
Neutrophils Relative %: 59.8 % (ref 43.0–77.0)
Platelets: 309 10*3/uL (ref 150.0–400.0)
RBC: 4.38 Mil/uL (ref 3.87–5.11)
RDW: 12.2 % (ref 11.5–15.5)
WBC: 7.4 10*3/uL (ref 4.0–10.5)

## 2022-01-22 LAB — BASIC METABOLIC PANEL
BUN: 17 mg/dL (ref 6–23)
CO2: 27 mEq/L (ref 19–32)
Calcium: 9.5 mg/dL (ref 8.4–10.5)
Chloride: 101 mEq/L (ref 96–112)
Creatinine, Ser: 0.91 mg/dL (ref 0.40–1.20)
GFR: 67.5 mL/min (ref >60.00)
Glucose, Bld: 93 mg/dL (ref 70–99)
Potassium: 3.6 mEq/L (ref 3.5–5.1)
Sodium: 137 mEq/L (ref 135–145)

## 2022-01-22 LAB — BRAIN NATRIURETIC PEPTIDE: Pro B Natriuretic peptide (BNP): 54 pg/mL (ref 0.0–100.0)

## 2022-01-22 LAB — TROPONIN I (HIGH SENSITIVITY): High Sens Troponin I: 3 ng/L (ref 2–17)

## 2022-01-22 NOTE — Patient Instructions (Signed)

## 2022-01-22 NOTE — Progress Notes (Signed)
?  ? ? ? ? ?[facility] ? ?Reason for Consult:hepatitis b ?Referring Provider: Janith Lima ? ? ? ?Patient Active Problem List  ? Diagnosis Date Noted  ? Vulvar lesion 01/10/2022  ? Early satiety 11/22/2021  ? OAB (overactive bladder) 11/22/2021  ? Urinary frequency 11/21/2021  ? Diarrhea 11/21/2021  ? Weight loss, non-intentional 11/21/2021  ? Stage 3a chronic kidney disease ([place]) 11/02/2020  ? Hyperplastic colonic polyp 06/29/2020  ? Cervical cancer screening 06/28/2020  ? Vitamin D deficiency disease 04/29/2019  ? Essential hypertension 04/29/2019  ? Hyperlipidemia with target LDL less than 100 04/29/2019  ? Bilateral edema of lower extremity 05/27/2017  ? Routine general medical examination at a health care facility 06/04/2016  ? Visit for screening mammogram 06/04/2016  ? Insomnia 08/17/2015  ? Rectal incontinence 08/17/2015  ? Pain due to interstitial cystitis 09/16/2014  ? ESOPHAGITIS, REFLUX 08/14/2007  ? ? ? ? ?HPI: Marie Torres is a 61 y.o. female referred by her pcp here for a positive hepatitis b e antigen ? ?She presented for her routine pap smear. She was offered other testings including hepatitis b so did it ? ?Reviewed test 01/10/2022 -- isolated hepatitis b eAg positive but negative core antibody, surface antigen, surface antibody ? ?Hep a & c antibody and hiv/syphilis screen are negative ? ?She denies risk factors for blood borne transmission ? ?Patient is single. She is in a relationship currently in 10/2021. Her partner got tested in [place] and was negative. Patient hasn't been sexual for 3 years until 10/2021 ? ?Patient denies any recent flu like syndrome, jaundice, pruritus ? ?She works at [employer] as a Training and development officer, and also at [employer] ? ?No smoking ?Occasional etoh ?No substance use otherwise including intranasal cocaine ? ?She is well at her baseline health ? ?Review of Systems: ?ROS ?All other ros negative ? ? ? ? ?Past Medical History:  ?Diagnosis Date  ?  Arthritis   ? right hand  ? Chicken pox   ? Childhood  ? Diverticulosis of colon (without mention of hemorrhage)   ? GERD (gastroesophageal reflux disease)   ? Hiatal hernia   ? Hyperplastic colon polyp   ? Mass of finger of right hand 02/2013  ? right middle finger  ? Seasonal allergies   ? Sinus congestion 03/16/2013  ? cough, stuffy and runny nose  ? Urine incontinence   ? Vitamin D deficiency   ? Wears partial dentures   ? upper  ? ? ?Social History  ? ?Tobacco Use  ? Smoking status: Former  ? Smokeless tobacco: Never  ? Tobacco comments:  ?  quit smoking 20 years ago  ?Vaping Use  ? Vaping Use: Never used  ?Substance Use Topics  ? Alcohol use: Yes  ?  Alcohol/week: 14.0 standard drinks  ?  Types: 14 Glasses of wine per week  ?  Comment: occasionally  ? Drug use: No  ? ? ?Family History  ?Problem Relation Age of Onset  ? Kidney disease Father   ? Heart disease Father   ? Stroke Father   ? Hypertension Father   ? Heart disease Mother   ? Depression Mother   ? Cancer Sister   ?     lung  ? Breast cancer Sister   ?     both breast removed  ? Colon cancer Neg Hx   ? Esophageal cancer Neg Hx   ? ? ?Allergies  ?Allergen Reactions  ? Shellfish Allergy Hives  ? ? ?  OBJECTIVE: ?Vitals:  ? 01/22/22 0846  ?BP: 115/75  ?Pulse: 71  ?Resp: 16  ?Temp: (!) 97.5 ?F (36.4 ?C)  ?TempSrc: Temporal  ?SpO2: 98%  ?Weight: 158 lb 12.8 oz (72 kg)  ?Height: '5\' 1"'$  (1.549 m)  ? ?Body mass index is 30 kg/m?. ? ? ?Physical Exam ?General/constitutional: no distress, pleasant ?HEENT: Normocephalic, PER, Conj Clear, EOMI, Oropharynx clear ?Neck supple ?CV: rrr no mrg ?Lungs: clear to auscultation, normal respiratory effort ?Abd: Soft, Nontender ?Ext: no edema ?Skin: No Rash ?Neuro: nonfocal ?MSK: no peripheral joint swelling/tenderness/warmth; back spines nontender ? ? ?Lab: ?Lab Results  ?Component Value Date  ? WBC 8.3 11/21/2021  ? HGB 13.5 11/21/2021  ? HCT 40.4 11/21/2021  ? MCV 86.6 11/21/2021  ? PLT 288.0 11/21/2021  ? ?Last metabolic  panel ?Lab Results  ?Component Value Date  ? GLUCOSE 79 11/21/2021  ? NA 138 11/21/2021  ? K 3.7 11/21/2021  ? CL 101 11/21/2021  ? CO2 29 11/21/2021  ? BUN 17 11/21/2021  ? CREATININE 1.06 11/21/2021  ? GFRNONAA 64 06/28/2020  ? CALCIUM 9.5 11/21/2021  ? PROT 7.9 11/21/2021  ? ALBUMIN 4.0 11/21/2021  ? BILITOT 0.4 11/21/2021  ? ALKPHOS 57 11/21/2021  ? AST 17 11/21/2021  ? ALT 11 11/21/2021  ? ANIONGAP 10 10/24/2018  ? ? ?Microbiology: ? ?Serology: ? ?  Latest Ref Rng & Units 01/10/2022  ?  1:58 PM  ?Hepatitis  ?Hep B Surface Ag Negative Negative    ?Hep B IgM Negative Negative    ? ?Lab Results  ?Component Value Date  ? HEPBIGM Negative 01/10/2022  ? HEPBCAB Negative 01/10/2022  ? HBEAG Positive (A) 01/10/2022  ? ?Hiv Non Reactive (03/16 1358)  ? ?Imaging: ? ? ?Assessment/plan: ?Problem List Items Addressed This Visit   ?None ?Visit Diagnoses   ? ? Abnormal laboratory test    -  Primary  ? Relevant Orders  ? Hepatitis B e antigen  ? Hepatitis B surface antibody,qualitative  ? Hepatitis B surface antigen  ? Hepatitis B e antibody  ? Hepatitis B DNA, ultraquantitative, PCR  ? Hepatitis B core antibody, total  ? Hepatitis B core antibody, IgM  ? Comprehensive metabolic panel  ? Need for hepatitis B screening test      ? Relevant Orders  ? Hepatitis B e antigen  ? Hepatitis B surface antibody,qualitative  ? Hepatitis B surface antigen  ? Hepatitis B e antibody  ? Hepatitis B DNA, ultraquantitative, PCR  ? Hepatitis B core antibody, total  ? Hepatitis B core antibody, IgM  ? Comprehensive metabolic panel  ? ?  ? ? ?Discussed risk/transmission, natural history of chronic vs acute hepatitis b. ?Discussed interpretation of hepatitis b serologic testing. ? ? ?Interesting that only the hepatitis b e antigen is positive ?I would expect for acute infection the lft would be up (it is normal, and hepatitis b sAg would also be positive as well -- it is negative for her) ? ?Query false positive result ? ?Her only risk so far would  be with her current new partner (sexual encounter 10/2021 -- none prior 3 years). So if this is true it would have been an acute process ? ?At this time, I would repeat labs and see how the serologic profile evolves to determine if infection present ? ?-repeat cmp, and hepatitis b full panel serology including dna study ?-follow up in 4 weeks ? ? ? ?I have spent a total of 65 minutes of face-to-face and non-face-to-face  time, excluding clinical staff time, preparing to see patient, ordering tests and/or medications, and provide counseling the patient  ? ? ? ? ? ? ?Follow-up: Return in about 4 weeks (around 02/19/2022). ? ?Jabier Mutton, MD ?[facility] ?[place] ?[phone] pager   [phone] cell ?01/22/2022, 9:11 AM ? ?

## 2022-01-22 NOTE — Patient Instructions (Signed)
It is unclear what the isolated hepatitis B e-antigen means, all other tests were negative ? ?Will need to repeat testing today  ? ?Please see me in around 4 weeks to discuss what the next step is once we have these results ?

## 2022-01-22 NOTE — Progress Notes (Signed)
? ?Subjective:  ?Patient ID: Marie Torres, female    DOB: August 16, 1961  Age: 61 y.o. MRN: [id number] ? ?CC: Hypertension ? ?This visit occurred during the SARS-CoV-2 public health emergency.  Safety protocols were in place, including screening questions prior to the visit, additional usage of staff PPE, and extensive cleaning of exam room while observing appropriate contact time as indicated for disinfecting solutions.   ? ?HPI ?[name] presents for f/up - ? ?She complains of a 2-week history of dyspnea on exertion and lightheadedness.  She denies chest pain, diaphoresis, near syncope, palpitations, edema, or fatigue. ? ?Outpatient Medications Prior to Visit  ?Medication Sig Dispense Refill  ? fluticasone (FLONASE) 50 MCG/ACT nasal spray Place 1 spray into both nostrils daily. 16 g 0  ? lidocaine (XYLOCAINE) 5 % ointment Apply 1 application topically as needed. 35.44 g 0  ? loratadine (CLARITIN) 10 MG tablet Take 1 tablet (10 mg total) by mouth daily. 30 tablet 0  ? rosuvastatin (CRESTOR) 5 MG tablet Take 1 tablet (5 mg total) by mouth daily. 90 tablet 1  ? solifenacin (VESICARE) 10 MG tablet Take 1 tablet (10 mg total) by mouth daily. 90 tablet 1  ? triamterene-hydrochlorothiazide (DYAZIDE) 37.5-25 MG capsule TAKE 1 CAPSULE BY MOUTH EVERY DAY 90 capsule 0  ? amoxicillin-clavulanate (AUGMENTIN) 875-125 MG tablet Take 1 tablet by mouth every 12 (twelve) hours. 10 tablet 0  ? ?No facility-administered medications prior to visit.  ? ? ?ROS ?Review of Systems  ?Constitutional:  Negative for appetite change, diaphoresis, fatigue and unexpected weight change.  ?HENT: Negative.    ?Eyes: Negative.   ?Respiratory:  Positive for shortness of breath. Negative for cough, choking, chest tightness, wheezing and stridor.   ?Cardiovascular:  Negative for chest pain, palpitations and leg swelling.  ?Gastrointestinal: Negative.  Negative for abdominal pain, constipation, diarrhea, nausea and vomiting.  ?Endocrine: Negative.    ?Genitourinary: Negative.  Negative for difficulty urinating.  ?Musculoskeletal: Negative.   ?Skin: Negative.   ?Neurological:  Positive for light-headedness. Negative for dizziness and weakness.  ?Hematological:  Negative for adenopathy. Does not bruise/bleed easily.  ?Psychiatric/Behavioral: Negative.    ? ?Objective:  ?BP 122/74 (BP Location: Left Arm, Patient Position: Sitting, Cuff Size: Large)   Pulse 77   Temp 98.1 ?F (36.7 ?C) (Oral)   Ht '5\' 1"'$  (1.549 m)   Wt 157 lb (71.2 kg)   SpO2 97%   BMI 29.66 kg/m?  ? ?BP Readings from Last 3 Encounters:  ?01/22/22 122/74  ?01/22/22 115/75  ?01/10/22 123/76  ? ? ?Wt Readings from Last 3 Encounters:  ?01/22/22 157 lb (71.2 kg)  ?01/22/22 158 lb 12.8 oz (72 kg)  ?01/10/22 160 lb 1.6 oz (72.6 kg)  ? ? ?Physical Exam ?Vitals reviewed.  ?HENT:  ?   Nose: Nose normal.  ?Eyes:  ?   General: No scleral icterus. ?   Conjunctiva/sclera: Conjunctivae normal.  ?Cardiovascular:  ?   Rate and Rhythm: Normal rate and regular rhythm.  ?   Heart sounds: Normal heart sounds, S1 normal and S2 normal.  ?  No friction rub. No gallop.  ?   Comments: EKG- ?SR with short PR ?No ST/T wave changes, LVH, or Q waves ?Pulmonary:  ?   Effort: Pulmonary effort is normal.  ?   Breath sounds: No stridor. No wheezing, rhonchi or rales.  ?Abdominal:  ?   General: Abdomen is flat.  ?   Palpations: There is no mass.  ?   Tenderness: There  is no abdominal tenderness. There is no guarding.  ?   Hernia: No hernia is present.  ?Musculoskeletal:  ?   Right lower leg: No edema.  ?   Left lower leg: No edema.  ?Skin: ?   General: Skin is warm and dry.  ?Neurological:  ?   General: No focal deficit present.  ?   Mental Status: She is alert. Mental status is at baseline.  ?Psychiatric:     ?   Mood and Affect: Mood normal.     ?   Behavior: Behavior normal.  ? ? ?Lab Results  ?Component Value Date  ? WBC 7.4 01/22/2022  ? HGB 12.8 01/22/2022  ? HCT 37.4 01/22/2022  ? PLT 309.0 01/22/2022  ? GLUCOSE 93  01/22/2022  ? CHOL 218 (H) 11/21/2021  ? TRIG 185.0 (H) 11/21/2021  ? HDL 63.80 11/21/2021  ? LDLDIRECT 150.8 11/30/2013  ? LDLCALC 117 (H) 11/21/2021  ? ALT 13 01/22/2022  ? AST 16 01/22/2022  ? NA 137 01/22/2022  ? K 3.6 01/22/2022  ? CL 101 01/22/2022  ? CREATININE 0.91 01/22/2022  ? BUN 17 01/22/2022  ? CO2 27 01/22/2022  ? TSH 0.97 11/21/2021  ? HGBA1C 5.7 04/29/2019  ? ? ?No results found. ? ?Assessment & Plan:  ? ?Ebba was seen today for hypertension. ? ?Diagnoses and all orders for this visit: ? ?DOE (dyspnea on exertion)- Her EKG, labs, and enzymes are reassuring.  I recommended that she undergo a coronary calcium score to screen for atherosclerosis. ?-     Brain natriuretic peptide; Future ?-     Troponin I (High Sensitivity); Future ?-     CBC with Differential/Platelet; Future ?-     EKG 12-Lead ?-     CBC with Differential/Platelet ?-     Troponin I (High Sensitivity) ?-     Brain natriuretic peptide ?-     CT CARDIAC SCORING (SELF PAY ONLY); Future ? ?Stage 3a chronic kidney disease ([place])- Her renal function is stable. ?-     Basic metabolic panel; Future ?-     CBC with Differential/Platelet; Future ?-     CBC with Differential/Platelet ?-     Basic metabolic panel ? ?Essential hypertension- Her blood pressure is adequately well controlled. ?-     Basic metabolic panel; Future ?-     CBC with Differential/Platelet; Future ?-     EKG 12-Lead ?-     CBC with Differential/Platelet ?-     Basic metabolic panel ? ? ?I have discontinued [name] "[name]"'s amoxicillin-clavulanate. I am also having her maintain her fluticasone, loratadine, lidocaine, rosuvastatin, solifenacin, and triamterene-hydrochlorothiazide. ? ?No orders of the defined types were placed in this encounter. ? ? ? ?Follow-up: Return in about 4 weeks (around 02/19/2022). ? ?Scarlette Calico, MD ?

## 2022-01-23 ENCOUNTER — Encounter: Payer: Self-pay | Admitting: Internal Medicine

## 2022-01-24 LAB — COMPREHENSIVE METABOLIC PANEL
AG Ratio: 1.1 (calc) (ref 1.0–2.5)
ALT: 13 U/L (ref 6–29)
AST: 16 U/L (ref 10–35)
Albumin: 3.8 g/dL (ref 3.6–5.1)
Alkaline phosphatase (APISO): 50 U/L (ref 37–153)
BUN: 16 mg/dL (ref 7–25)
CO2: 30 mmol/L (ref 20–32)
Calcium: 9.7 mg/dL (ref 8.6–10.4)
Chloride: 104 mmol/L (ref 98–110)
Creat: 0.92 mg/dL (ref 0.50–1.05)
Globulin: 3.5 g/dL (calc) (ref 1.9–3.7)
Glucose, Bld: 88 mg/dL (ref 65–99)
Potassium: 3.9 mmol/L (ref 3.5–5.3)
Sodium: 141 mmol/L (ref 135–146)
Total Bilirubin: 0.5 mg/dL (ref 0.2–1.2)
Total Protein: 7.3 g/dL (ref 6.1–8.1)

## 2022-01-24 LAB — HEPATITIS B CORE ANTIBODY, TOTAL: Hep B Core Total Ab: NONREACTIVE

## 2022-01-24 LAB — HEPATITIS B E ANTIBODY: Hep B E Ab: NONREACTIVE

## 2022-01-24 LAB — HEPATITIS B CORE ANTIBODY, IGM: Hep B C IgM: NONREACTIVE

## 2022-01-24 LAB — HEPATITIS B DNA, ULTRAQUANTITATIVE, PCR
Hepatitis B DNA (Calc): <1 Log IU/mL
Hepatitis B DNA: <10 IU/mL

## 2022-01-24 LAB — HEPATITIS B SURFACE ANTIBODY,QUALITATIVE: Hep B S Ab: NONREACTIVE

## 2022-01-24 LAB — HEPATITIS B E ANTIGEN: Hep B E Ag: NONREACTIVE

## 2022-01-24 LAB — HEPATITIS B SURFACE ANTIGEN: Hepatitis B Surface Ag: NONREACTIVE

## 2022-03-13 ENCOUNTER — Inpatient Hospital Stay: Admission: RE | Admit: 2022-03-13 | Payer: BC Managed Care – PPO | Source: Ambulatory Visit

## 2022-03-14 ENCOUNTER — Encounter: Payer: Self-pay | Admitting: Internal Medicine

## 2022-03-15 ENCOUNTER — Inpatient Hospital Stay: Admission: RE | Admit: 2022-03-15 | Payer: BC Managed Care – PPO | Source: Ambulatory Visit

## 2022-03-22 ENCOUNTER — Ambulatory Visit
Admission: RE | Admit: 2022-03-22 | Discharge: 2022-03-22 | Disposition: A | Discharge status: Home / Self Care | Payer: Self-pay | Source: Ambulatory Visit | Attending: Internal Medicine | Admitting: Internal Medicine

## 2022-03-22 DIAGNOSIS — R0609 Other forms of dyspnea: Secondary | ICD-10-CM

## 2022-03-23 ENCOUNTER — Encounter: Payer: Self-pay | Admitting: Internal Medicine

## 2022-03-23 ENCOUNTER — Other Ambulatory Visit: Payer: Self-pay | Admitting: Internal Medicine

## 2022-04-11 ENCOUNTER — Ambulatory Visit: Payer: BC Managed Care – PPO | Admitting: Podiatry

## 2022-04-11 DIAGNOSIS — Q828 Other specified congenital malformations of skin: Secondary | ICD-10-CM | POA: Diagnosis not present

## 2022-04-11 DIAGNOSIS — M216X2 Other acquired deformities of left foot: Secondary | ICD-10-CM | POA: Diagnosis not present

## 2022-04-12 NOTE — Progress Notes (Signed)
Subjective:  Patient ID: Marie Torres, female    DOB: [birth year],  MRN: [id number]  Chief Complaint  Patient presents with   Callouses    61 y.o. female presents with the above complaint.  Patient presents with complaint of left submetatarsal 2 porokeratotic lesion.  Patient states it came back again and is gotten more painful.  She states that the debridement did help some however it continues to come back she has failed all conservative treatment options she tried to get modification padding offloading and which has helped.  She wears orthotics.  She would like to discuss surgical options at this time.  Pain scale 7 out of 10 hurts with ambulation especially when it gets bigger.   Review of Systems: Negative except as noted in the HPI. Denies N/V/F/Ch.  Past Medical History:  Diagnosis Date   Arthritis    right hand   Chicken pox    Childhood   Diverticulosis of colon (without mention of hemorrhage)    GERD (gastroesophageal reflux disease)    Hiatal hernia    Hyperplastic colon polyp    Mass of finger of right hand 02/2013   right middle finger   Seasonal allergies    Sinus congestion 03/16/2013   cough, stuffy and runny nose   Urine incontinence    Vitamin D deficiency    Wears partial dentures    upper    Current Outpatient Medications:    fluticasone (FLONASE) 50 MCG/ACT nasal spray, Place 1 spray into both nostrils daily., Disp: 16 g, Rfl: 0   lidocaine (XYLOCAINE) 5 % ointment, Apply 1 application topically as needed., Disp: 35.44 g, Rfl: 0   loratadine (CLARITIN) 10 MG tablet, Take 1 tablet (10 mg total) by mouth daily., Disp: 30 tablet, Rfl: 0   rosuvastatin (CRESTOR) 5 MG tablet, Take 1 tablet (5 mg total) by mouth daily., Disp: 90 tablet, Rfl: 1   solifenacin (VESICARE) 10 MG tablet, Take 1 tablet (10 mg total) by mouth daily., Disp: 90 tablet, Rfl: 1   triamterene-hydrochlorothiazide (DYAZIDE) 37.5-25 MG capsule, TAKE 1 CAPSULE BY MOUTH EVERY DAY, Disp: 90  capsule, Rfl: 0  Social History   Tobacco Use  Smoking Status Former  Smokeless Tobacco Never  Tobacco Comments   quit smoking 20 years ago    Allergies  Allergen Reactions   Shellfish Allergy Hives   Objective:  There were no vitals filed for this visit. There is no height or weight on file to calculate BMI. Constitutional Well developed. Well nourished.  Vascular Dorsalis pedis pulses palpable bilaterally. Posterior tibial pulses palpable bilaterally. Capillary refill normal to all digits.  No cyanosis or clubbing noted. Pedal hair growth normal.  Neurologic Normal speech. Oriented to person, place, and time. Epicritic sensation to light touch grossly present bilaterally.  Dermatologic Hyperkeratotic lesion with central nucleated core noted to left submetatarsal 2 with underlying plantarflexed second metatarsal putting excessive stress on the lesion.  Orthopedic: Normal joint ROM without pain or crepitus bilaterally. No visible deformities. No bony tenderness.   Radiographs: None Assessment:   1. Plantar flexed metatarsal, left   2. Porokeratosis    Plan:  Patient was evaluated and treated and all questions answered.  Left plantarflexed segment second metatarsal with underlying porokeratotic lesion -All questions and concerns were discussed with the patient in extensive detail.  She would like to continue focusing on conservative treatment options at this time.  If there is no improvement with surgical debridement of the lesion I believe she will  benefit from surgical excision of the lesion with floating osteotomy.  I briefly discussed this with the patient she would like to think about it and will get back to me when she is ready. -I using chisel blade handle the lesion was debrided down to healthy scar tissue.  No complication noted no pinpoint bleeding noted.  No follow-ups on file.

## 2022-04-17 ENCOUNTER — Ambulatory Visit (INDEPENDENT_AMBULATORY_CARE_PROVIDER_SITE_OTHER): Payer: BC Managed Care – PPO

## 2022-04-17 ENCOUNTER — Encounter (HOSPITAL_COMMUNITY): Payer: Self-pay

## 2022-04-17 ENCOUNTER — Ambulatory Visit (HOSPITAL_COMMUNITY)
Admission: EM | Admit: 2022-04-17 | Discharge: 2022-04-17 | Disposition: A | Discharge status: Home / Self Care | Payer: BC Managed Care – PPO | Attending: Physician Assistant | Admitting: Physician Assistant

## 2022-04-17 DIAGNOSIS — M7989 Other specified soft tissue disorders: Secondary | ICD-10-CM | POA: Diagnosis not present

## 2022-04-17 DIAGNOSIS — I10 Essential (primary) hypertension: Secondary | ICD-10-CM | POA: Diagnosis not present

## 2022-04-17 DIAGNOSIS — M25522 Pain in left elbow: Secondary | ICD-10-CM

## 2022-04-17 NOTE — ED Triage Notes (Signed)
Pt presents with c/o a mass on her left arm that she first noticed today. NKI. Pt denies any pain.

## 2022-04-17 NOTE — ED Provider Notes (Signed)
[place]    CSN: [id number] Arrival date & time: 04/17/22  1456      History   Chief Complaint Chief Complaint  Patient presents with   Mass    Left arm    HPI Marie Torres is a 61 y.o. female.   Patient presents today with a 1 day history of swelling on her medial anterior elbow.  She denies any known injury or change in activity prior to symptom onset.  She reports pain is tenderness to palpation with pain is rated 3/4 on a 0-10 pain scale, described as aching, worse with palpation, no alleviating factors identified.  She has not tried any over-the-counter medication for symptom management.  Denies history of surgery involving elbow.  She denies any recent medication injection, IV use, blood draw.  She denies any history of VTE event.  Denies VTE risk factors including hospitalization, recent surgery, recent COVID-19 infection, recent travel or immobilization, exogenous hormones use.  Denies active malignancy.  She first noticed the swelling today prompting evaluation.  Denies any allergic reactions or insect/plant exposures.  Denies associated pruritus.    Past Medical History:  Diagnosis Date   Arthritis    right hand   Chicken pox    Childhood   Diverticulosis of colon (without mention of hemorrhage)    GERD (gastroesophageal reflux disease)    Hiatal hernia    Hyperplastic colon polyp    Mass of finger of right hand 02/2013   right middle finger   Seasonal allergies    Sinus congestion 03/16/2013   cough, stuffy and runny nose   Urine incontinence    Vitamin D deficiency    Wears partial dentures    upper    Patient Active Problem List   Diagnosis Date Noted   DOE (dyspnea on exertion) 01/22/2022   Vulvar lesion 01/10/2022   Early satiety 11/22/2021   OAB (overactive bladder) 11/22/2021   Urinary frequency 11/21/2021   Diarrhea 11/21/2021   Weight loss, non-intentional 11/21/2021   Stage 3a chronic kidney disease ([place]) 11/02/2020    Hyperplastic colonic polyp 06/29/2020   Cervical cancer screening 06/28/2020   Vitamin D deficiency disease 04/29/2019   Essential hypertension 04/29/2019   Hyperlipidemia with target LDL less than 100 04/29/2019   Bilateral edema of lower extremity 05/27/2017   Routine general medical examination at a health care facility 06/04/2016   Visit for screening mammogram 06/04/2016   Insomnia 08/17/2015   Rectal incontinence 08/17/2015   Pain due to interstitial cystitis 09/16/2014   ESOPHAGITIS, REFLUX 08/14/2007    Past Surgical History:  Procedure Laterality Date   COLONOSCOPY  08/14/2007   562.10, 211.3   FOOT SURGERY Bilateral    exc. ingrown toenail great toe   TUBAL LIGATION  09/02/2002    OB History     Gravida  1   Para  1   Term  1   Preterm      AB      Living  1      SAB      IAB      Ectopic      Multiple      Live Births               Home Medications    Prior to Admission medications   Medication Sig Start Date End Date Taking? Authorizing Provider  fluticasone (FLONASE) 50 MCG/ACT nasal spray Place 1 spray into both nostrils daily. 09/13/21   Chase Picket,  MD  lidocaine (XYLOCAINE) 5 % ointment Apply 1 application topically as needed. 09/28/21   Felipa Furnace, DPM  loratadine (CLARITIN) 10 MG tablet Take 1 tablet (10 mg total) by mouth daily. 09/13/21   Lamptey, Myrene Galas, MD  rosuvastatin (CRESTOR) 5 MG tablet Take 1 tablet (5 mg total) by mouth daily. 11/21/21   Janith Lima, MD  solifenacin (VESICARE) 10 MG tablet Take 1 tablet (10 mg total) by mouth daily. 11/22/21   Janith Lima, MD  triamterene-hydrochlorothiazide (DYAZIDE) 37.5-25 MG capsule TAKE 1 CAPSULE BY MOUTH EVERY DAY 12/30/21   Janith Lima, MD    Family History Family History  Problem Relation Age of Onset   Kidney disease Father    Heart disease Father    Stroke Father    Hypertension Father    Heart disease Mother    Depression Mother    Cancer Sister         lung   Breast cancer Sister        both breast removed   Colon cancer Neg Hx    Esophageal cancer Neg Hx     Social History Social History   Tobacco Use   Smoking status: Former   Smokeless tobacco: Never   Tobacco comments:    quit smoking 20 years ago  Vaping Use   Vaping Use: Never used  Substance Use Topics   Alcohol use: Yes    Alcohol/week: 14.0 standard drinks of alcohol    Types: 14 Glasses of wine per week    Comment: occasionally   Drug use: No     Allergies   Shellfish allergy   Review of Systems Review of Systems  Constitutional:  Positive for activity change. Negative for appetite change, fatigue and fever.  Respiratory:  Negative for cough and shortness of breath.   Cardiovascular:  Negative for chest pain, palpitations and leg swelling.  Gastrointestinal:  Negative for abdominal pain, diarrhea, nausea and vomiting.  Musculoskeletal:  Positive for joint swelling and myalgias. Negative for arthralgias.  Neurological:  Negative for dizziness, weakness, light-headedness, numbness and headaches.     Physical Exam Triage Vital Signs ED Triage Vitals [04/17/22 1547]  Enc Vitals Group     BP (!) 162/91     Pulse Rate 76     Resp 14     Temp 98.2 F (36.8 C)     Temp Source Oral     SpO2 97 %     Weight      Height      Head Circumference      Peak Flow      Pain Score 0     Pain Loc      Pain Edu?      Excl. in [place]?    No data found.  Updated Vital Signs BP (!) 153/84 (BP Location: Right Arm)   Pulse 76   Temp 98.2 F (36.8 C) (Oral)   Resp 14   SpO2 97%   Visual Acuity Right Eye Distance:   Left Eye Distance:   Bilateral Distance:    Right Eye Near:   Left Eye Near:    Bilateral Near:     Physical Exam Vitals reviewed.  Constitutional:      General: She is awake. She is not in acute distress.    Appearance: Normal appearance. She is well-developed. She is not ill-appearing.     Comments: Very pleasant female appears stated  age in no acute distress sitting  comfortably in exam room.  HENT:     Head: Normocephalic and atraumatic.  Cardiovascular:     Rate and Rhythm: Normal rate and regular rhythm.     Pulses:          Radial pulses are 2+ on the right side and 2+ on the left side.     Heart sounds: Normal heart sounds, S1 normal and S2 normal. No murmur heard. Pulmonary:     Effort: Pulmonary effort is normal.     Breath sounds: Normal breath sounds. No wheezing, rhonchi or rales.     Comments: Clear to auscultation bilaterally Musculoskeletal:     Left elbow: Swelling present. Decreased range of motion. Tenderness present in radial head.     Left forearm: Swelling present. No edema, tenderness or bony tenderness.       Arms:     Comments: Swelling and mild tenderness palpation over medial left elbow and along radial head.  Normal active range of motion.  Hand neurovascularly intact.  Psychiatric:        Behavior: Behavior is cooperative.      UC Treatments / Results  Labs (all labs ordered are listed, but only abnormal results are displayed) Labs Reviewed - No data to display  EKG   Radiology DG Elbow Complete Left  Result Date: 04/17/2022 CLINICAL DATA:  Pain and swelling with no known injury EXAM: LEFT ELBOW - COMPLETE 3+ VIEW COMPARISON:  None available FINDINGS: Slight irregularity of the medial and lateral humeral epicondyles consistent with prior episodes of epicondylitis. No acute fracture or dislocation. No significant joint effusion. IMPRESSION: No acute abnormality of the left elbow. Electronically Signed   By: Miachel Roux M.D.   On: 04/17/2022 16:28    Procedures Procedures (including critical care time)  Medications Ordered in UC Medications - No data to display  Initial Impression / Assessment and Plan / UC Course  I have reviewed the triage vital signs and the nursing notes.  Pertinent labs & imaging results that were available during my care of the patient were reviewed by  me and considered in my medical decision making (see chart for details).     Unclear etiology of symptoms.  Swelling is superficial but she does have tenderness over radial head.  X-ray was obtained that showed evidence of previous epicondylitis but no acute abnormality.  Patient is concerned about VTE event despite low risk.  We will obtain ultrasound to rule this out and she was given information for appointment to be done tomorrow (04/18/2022) at 11:00.  Discussed that if she develops any chest pain, shortness of breath, headache, increased pain, increased swelling, palpitations overnight she needs to go to the emergency room.  She can use Tylenol and heat for symptom relief.  Discussed alarm symptoms are more emergent evaluation.  Blood pressure is elevated today.  Patient denies any signs/symptoms of endorgan damage.  She has been taking her medication as prescribed.  Recommend she avoid NSAIDs, decongestants, caffeine, sodium.  She is to monitor her blood pressure at home.  Recommended follow-up with PCP next week for recheck.  Discussed that if she develops any chest pain, shortness of breath, headache, vision change, dizziness in the setting of high blood pressure she needs to go to the emergency room to which she expressed understanding.  Final Clinical Impressions(s) / UC Diagnoses   Final diagnoses:  Left arm swelling  Elevated blood pressure reading with diagnosis of hypertension     Discharge Instructions  Your x-ray was normal.  Please go for ultrasound tomorrow as we discussed.  We will contact you if this is positive for a blood clot.  Keep your arm elevated and use heat on the area.  If anything worsens and you develop increased swelling, increased pain, chest pain, shortness of breath, heart racing you need to go to the emergency room.  Your blood pressure is elevated today.  Please continue your blood pressure medication as prescribed.  Monitor this at home.  Avoid NSAIDs  including aspirin, ibuprofen/Advil, naproxen/Aleve.  Limit decongestants, caffeine, sodium.  Follow-up with your primary care provider next week for recheck.  If you develop any chest pain, shortness of breath, headache, vision change, dizziness in the setting of high blood pressure you need to go to the emergency room immediately.     ED Prescriptions   None    PDMP not reviewed this encounter.   Terrilee Croak, PA-C 04/17/22 1652

## 2022-04-17 NOTE — Discharge Instructions (Signed)
Your x-ray was normal.  Please go for ultrasound tomorrow as we discussed.  We will contact you if this is positive for a blood clot.  Keep your arm elevated and use heat on the area.  If anything worsens and you develop increased swelling, increased pain, chest pain, shortness of breath, heart racing you need to go to the emergency room.  Your blood pressure is elevated today.  Please continue your blood pressure medication as prescribed.  Monitor this at home.  Avoid NSAIDs including aspirin, ibuprofen/Advil, naproxen/Aleve.  Limit decongestants, caffeine, sodium.  Follow-up with your primary care provider next week for recheck.  If you develop any chest pain, shortness of breath, headache, vision change, dizziness in the setting of high blood pressure you need to go to the emergency room immediately.

## 2022-04-18 ENCOUNTER — Ambulatory Visit (HOSPITAL_COMMUNITY)
Admission: RE | Admit: 2022-04-18 | Discharge: 2022-04-18 | Disposition: A | Discharge status: Home / Self Care | Payer: BC Managed Care – PPO | Source: Ambulatory Visit | Attending: Physician Assistant | Admitting: Physician Assistant

## 2022-04-18 DIAGNOSIS — M7989 Other specified soft tissue disorders: Secondary | ICD-10-CM

## 2022-04-18 NOTE — Progress Notes (Signed)
Left upper extremity venous duplex has been completed. Preliminary results can be found in CV Proc through chart review.   04/18/22 11:20 AM Marie Torres RVT

## 2022-04-23 ENCOUNTER — Encounter (HOSPITAL_COMMUNITY): Payer: Self-pay

## 2022-04-23 ENCOUNTER — Ambulatory Visit: Payer: BC Managed Care – PPO | Admitting: Internal Medicine

## 2022-04-23 ENCOUNTER — Encounter: Payer: Self-pay | Admitting: Internal Medicine

## 2022-04-23 ENCOUNTER — Ambulatory Visit (HOSPITAL_COMMUNITY)
Admission: EM | Admit: 2022-04-23 | Discharge: 2022-04-23 | Disposition: A | Discharge status: Home / Self Care | Payer: BC Managed Care – PPO | Attending: Emergency Medicine | Admitting: Emergency Medicine

## 2022-04-23 DIAGNOSIS — R319 Hematuria, unspecified: Secondary | ICD-10-CM | POA: Diagnosis present

## 2022-04-23 DIAGNOSIS — N3090 Cystitis, unspecified without hematuria: Secondary | ICD-10-CM | POA: Diagnosis not present

## 2022-04-23 DIAGNOSIS — R051 Acute cough: Secondary | ICD-10-CM | POA: Diagnosis not present

## 2022-04-23 DIAGNOSIS — N3000 Acute cystitis without hematuria: Secondary | ICD-10-CM | POA: Insufficient documentation

## 2022-04-23 LAB — POCT URINALYSIS DIPSTICK, ED / UC
Bilirubin Urine: NEGATIVE
Glucose, UA: NEGATIVE mg/dL
Ketones, ur: NEGATIVE mg/dL
Nitrite: NEGATIVE
Protein, ur: 30 mg/dL — AB
Specific Gravity, Urine: 1.02 (ref 1.005–1.030)
Urobilinogen, UA: 0.2 mg/dL (ref 0.0–1.0)
pH: 5.5 (ref 5.0–8.0)

## 2022-04-23 MED ORDER — PHENAZOPYRIDINE HCL 100 MG PO TABS: 100.0000 mg | ORAL_TABLET | Freq: Three times a day (TID) | ORAL | 0 refills | Status: DC | PRN

## 2022-04-23 MED ORDER — CIPROFLOXACIN HCL 250 MG PO TABS: 250.0000 mg | ORAL_TABLET | Freq: Two times a day (BID) | ORAL | 0 refills | Status: DC

## 2022-04-23 NOTE — ED Triage Notes (Signed)
Pt states she is having burning when she urinates and blood when she wipes for the past 4 days.  Also C/O cough for the past 3 days.

## 2022-04-24 LAB — URINALYSIS, ROUTINE W REFLEX MICROSCOPIC
Bilirubin Urine: NEGATIVE
Nitrite: NEGATIVE
Specific Gravity, Urine: 1.025 (ref 1.000–1.030)
Total Protein, Urine: 30 — AB
Urine Glucose: NEGATIVE
Urobilinogen, UA: 0.2 (ref 0.0–1.0)
pH: 5.5 (ref 5.0–8.0)

## 2022-04-25 LAB — URINE CULTURE: Culture: >=100000 — AB

## 2022-05-21 ENCOUNTER — Ambulatory Visit (INDEPENDENT_AMBULATORY_CARE_PROVIDER_SITE_OTHER): Payer: BC Managed Care – PPO

## 2022-05-21 ENCOUNTER — Encounter: Payer: Self-pay | Admitting: Internal Medicine

## 2022-05-21 ENCOUNTER — Ambulatory Visit: Payer: BC Managed Care – PPO | Admitting: Internal Medicine

## 2022-05-21 VITALS — BP 126/84 | HR 83 | Temp 98.1°F | Ht 61.0 in | Wt 161.0 lb

## 2022-05-21 DIAGNOSIS — S8990XA Unspecified injury of unspecified lower leg, initial encounter: Secondary | ICD-10-CM | POA: Insufficient documentation

## 2022-05-21 DIAGNOSIS — M25562 Pain in left knee: Secondary | ICD-10-CM | POA: Insufficient documentation

## 2022-05-21 MED ORDER — HYDROCODONE-ACETAMINOPHEN 5-325 MG PO TABS: 1.0000 | ORAL_TABLET | Freq: Four times a day (QID) | ORAL | 0 refills | Status: AC | PRN

## 2022-05-21 NOTE — Patient Instructions (Signed)
Medial Collateral Knee Ligament Sprain  The medial collateral ligament (MCL) is a tough band of tissue in the knee. It connects the top of the shin bone (tibia) to the bottom of the thigh bone (femur). Your MCL prevents your knee from moving too far inward and helps to keep your knee stable. An MCL sprain is a stretch or tear in the MCL. What are the causes? This condition may be caused by: A hard, direct hit to the outside of your knee. This is a common cause. Your knee falling inward when you run, jump, pivot, or change directions quickly, also called cutting. Repeatedly overstretching the MCL. What increases the risk? The following factors make you more likely to develop this condition: Playing contact sports, such as wrestling or football. Participating in sports that involve sudden movements of cutting, twisting, or turning. These movements are common in hockey, skiing, and soccer. Having weak hip and core muscles. What are the signs or symptoms? Symptoms of this condition include: A feeling like a pop or a snap in your knee at the time of injury. Hearing a noise, like a pop or a snap, at the time of injury. Pain on the inside of the knee. Swelling in the knee. Bruising around the knee. Tenderness when pressing the inside of the knee. Feeling unstable when you stand, like your knee will give way. Difficulty walking on uneven surfaces. How is this diagnosed? This condition may be diagnosed based on: Your medical history. A physical exam. Imaging tests, such as an X-ray, ultrasound, or MRI. During your physical exam, your health care provider will check for pain, limited motion, and instability. How is this treated? Treatment for this condition depends on how severe the injury is. Treatment may include: Keeping weight off the knee until swelling and pain improve. Raising (elevating) the knee above the level of your heart. This helps to reduce swelling. Icing the knee. This helps  to reduce swelling. Taking an NSAID, such as ibuprofen. This helps to reduce pain and swelling. Using a knee brace, elastic sleeve, or crutches while the injury heals. Using a knee brace when participating in athletic activities. Doing rehab exercises (physical therapy). Surgery. This may be needed if: Your MCL tore all the way through. Your knee is unstable. Your knee is not getting better with other treatments. Follow these instructions at home: If you have a brace or sleeve: Wear it as told by your health care provider. Remove it only as told by your health care provider. Check the skin around the brace or sleeve every day. Tell your health care provider about any concerns. Loosen the brace or remove the sleeve if your toes tingle, become numb, or turn cold and blue. Keep it clean. If the brace or sleeve is not waterproof: Do not let it get wet. Cover it with a watertight covering when you take a bath or shower. Managing pain, stiffness, and swelling  If directed, put ice on the inside area of your knee. To do this: If you have a removable brace or sleeve, remove it as told by your health care provider. Put ice in a plastic bag. Place a towel between your skin and the bag. Leave the ice on for 20 minutes, 2-3 times a day. Move your foot and toes often to reduce stiffness and swelling. Raise (elevate) the injured area above the level of your heart while you are sitting or lying down. Activity Ask your health care provider when it is safe to drive  if you have a brace or sleeve on your leg. Do exercises as told by your health care provider. Do not use the injured leg to support your body weight until your health care provider says that you can. Use crutches as told by your health care provider. Return to your normal activities as told by your health care provider. Ask your health care provider what activities are safe for you. General instructions Take over-the-counter and  prescription medicines only as told by your health care provider. Do not use any products that contain nicotine or tobacco. These products include cigarettes, chewing tobacco, and vaping devices, such as e-cigarettes. If you need help quitting, ask your health care provider. Keep all follow-up visits. This is important. How is this prevented? Warm up and stretch before being active. Cool down and stretch after being active. Give your body time to rest between periods of activity. Make sure to use shoes and other equipment that fits you. Be safe and responsible while being active. This will help you avoid falls. Do at least 150 minutes of moderate-intensity exercise each week, such as brisk walking or water aerobics. Maintain physical fitness, including: Strength. Flexibility. Heart (cardiovascular) fitness. Endurance. Contact a health care provider if: Your symptoms do not improve. Your symptoms get worse. Summary An MCL sprain is a knee injury that is caused by stretching the MCL too far. The injury can involve a tear in the MCL. Treatment for this condition depends on how severe the injury is. It may include rest, wearing a brace, or surgery. Do not use the injured leg to support your body weight until your health care provider says that you can. Use crutches as told by your health care provider. Contact a health care provider if your symptoms do not get better or they get worse. Keep all follow-up visits. This is important. This information is not intended to replace advice given to you by your health care provider. Make sure you discuss any questions you have with your health care provider. Document Revised: 06/20/2020 Document Reviewed: 06/20/2020 Elsevier Patient Education  [place].

## 2022-05-21 NOTE — Progress Notes (Unsigned)
Subjective:  Patient ID: Marie Torres, female    DOB: [birth year]  Age: 61 y.o. MRN: [id number]  CC: No chief complaint on file.   HPI EURETHA NAJARRO presents for f/up -     Outpatient Medications Prior to Visit  Medication Sig Dispense Refill   Cholecalciferol 1.25 MG (50000 UT) capsule TAKE 1 CAPSULE BY MOUTH ONE TIME PER WEEK     colchicine 0.6 MG tablet Take 1 tablet every day by oral route.     diazepam (VALIUM) 5 MG tablet 1 po 30 mins prior to MRI     fluticasone (FLONASE) 50 MCG/ACT nasal spray Place 1 spray into both nostrils daily. 16 g 0   lidocaine (XYLOCAINE) 5 % ointment Apply 1 application topically as needed. 35.44 g 0   loratadine (CLARITIN) 10 MG tablet Take 1 tablet (10 mg total) by mouth daily. 30 tablet 0   meloxicam (MOBIC) 7.5 MG tablet TAKE 1 TABLET TWICE A DAY FOR 2 WEEKS,THEN AS NEEDED     methocarbamol (ROBAXIN) 500 MG tablet TAKE 1 TABLET BY MOUTH TWICE A DAY AS NEEDED FOR SPASMS     phenazopyridine (PYRIDIUM) 100 MG tablet Take 1 tablet (100 mg total) by mouth 3 (three) times daily as needed for pain. 15 tablet 0   rosuvastatin (CRESTOR) 5 MG tablet Take 1 tablet (5 mg total) by mouth daily. 90 tablet 1   solifenacin (VESICARE) 10 MG tablet Take 1 tablet (10 mg total) by mouth daily. 90 tablet 1   triamterene-hydrochlorothiazide (DYAZIDE) 37.5-25 MG capsule TAKE 1 CAPSULE BY MOUTH EVERY DAY 90 capsule 0   ciprofloxacin (CIPRO) 250 MG tablet Take 1 tablet (250 mg total) by mouth 2 (two) times daily. 10 tablet 0   No facility-administered medications prior to visit.    ROS Review of Systems  Objective:  BP 126/84 (BP Location: Left Arm, Patient Position: Sitting, Cuff Size: Large)   Pulse 83   Temp 98.1 F (36.7 C) (Oral)   Ht '5\' 1"'$  (1.549 m)   Wt 161 lb (73 kg)   SpO2 95%   BMI 30.42 kg/m   BP Readings from Last 3 Encounters:  05/21/22 126/84  04/23/22 130/70  04/23/22 (!) 147/88    Wt Readings from Last 3 Encounters:  05/21/22 161 lb  (73 kg)  04/23/22 159 lb (72.1 kg)  01/22/22 157 lb (71.2 kg)    Physical Exam  Lab Results  Component Value Date   WBC 7.4 01/22/2022   HGB 12.8 01/22/2022   HCT 37.4 01/22/2022   PLT 309.0 01/22/2022   GLUCOSE 93 01/22/2022   CHOL 218 (H) 11/21/2021   TRIG 185.0 (H) 11/21/2021   HDL 63.80 11/21/2021   LDLDIRECT 150.8 11/30/2013   LDLCALC 117 (H) 11/21/2021   ALT 13 01/22/2022   AST 16 01/22/2022   NA 137 01/22/2022   K 3.6 01/22/2022   CL 101 01/22/2022   CREATININE 0.91 01/22/2022   BUN 17 01/22/2022   CO2 27 01/22/2022   TSH 0.97 11/21/2021   HGBA1C 5.7 04/29/2019    DG Knee Complete 4 Views Left  Result Date: 05/21/2022 CLINICAL DATA:  Left knee pain for 1 week.  No known injury EXAM: LEFT KNEE - COMPLETE 4+ VIEW COMPARISON:  09/13/2021 FINDINGS: No evidence of fracture, dislocation, or joint effusion. Small enthesophyte at the quadriceps tendon insertion. Thin linear calcification along the medial aspect of the medial tibial plateau may reflect sequela of prior MCL injury. No evidence of arthropathy  or other focal bone abnormality. No focal soft tissue swelling. IMPRESSION: 1. No acute osseous abnormality, left knee. 2. Findings suggest sequela of prior MCL injury. Electronically Signed   By: Davina Poke D.O.   On: 05/21/2022 15:16     Assessment & Plan:   Diagnoses and all orders for this visit:  Acute pain of left knee -     DG Knee Complete 4 Views Left; Future -     Ambulatory referral to Orthopedic Surgery  Injury of medial collateral ligament (MCL) of knee -     Ambulatory referral to Orthopedic Surgery   I have discontinued Marie Torres "[name]"'s ciprofloxacin. I am also having her maintain her fluticasone, loratadine, lidocaine, rosuvastatin, solifenacin, triamterene-hydrochlorothiazide, methocarbamol, meloxicam, diazepam, colchicine, Cholecalciferol, and phenazopyridine.  No orders of the defined types were placed in this  encounter.    Follow-up: Return in about 3 months (around 08/21/2022).  Scarlette Calico, MD

## 2022-05-22 ENCOUNTER — Ambulatory Visit: Payer: BC Managed Care – PPO | Admitting: Orthopedic Surgery

## 2022-05-22 ENCOUNTER — Other Ambulatory Visit: Payer: Self-pay | Admitting: Internal Medicine

## 2022-05-22 ENCOUNTER — Ambulatory Visit (INDEPENDENT_AMBULATORY_CARE_PROVIDER_SITE_OTHER): Payer: BC Managed Care – PPO | Admitting: Podiatry

## 2022-05-22 ENCOUNTER — Ambulatory Visit (INDEPENDENT_AMBULATORY_CARE_PROVIDER_SITE_OTHER): Payer: BC Managed Care – PPO

## 2022-05-22 DIAGNOSIS — Z01818 Encounter for other preprocedural examination: Secondary | ICD-10-CM

## 2022-05-22 DIAGNOSIS — M216X2 Other acquired deformities of left foot: Secondary | ICD-10-CM

## 2022-05-22 DIAGNOSIS — M25562 Pain in left knee: Secondary | ICD-10-CM | POA: Diagnosis not present

## 2022-05-22 DIAGNOSIS — N3281 Overactive bladder: Secondary | ICD-10-CM

## 2022-05-23 ENCOUNTER — Telehealth: Payer: Self-pay | Admitting: Urology

## 2022-05-23 NOTE — Telephone Encounter (Signed)
DOS - 06/17/22  METATARSAL OSTEOTOMY 2ND AND 5TH LEFT --- 28308  [employer] EFFECTIVE DATE - 06/24/21  PLAN DEDUCTIBLE - $0.00 OUT OF POCKET - $2,500.00 W/ $2,359.73 REMAINING COINSURANCE - 20% COPAY - $0.00  SPOKE WITH MONET WITH [employer] AND SHE STATED THAT FOR CPT CODE 88677 NO PRIOR AUTH IS REQUIRED.  REF # [id number]

## 2022-05-25 ENCOUNTER — Encounter: Payer: Self-pay | Admitting: Orthopedic Surgery

## 2022-05-25 NOTE — Progress Notes (Signed)
Office Visit Note   Patient: RAFEEF LAU           Date of Birth: 05-06-1961           MRN: [id number] Visit Date: 05/22/2022 Requested by: Janith Lima, MD [id number] [place],  [place] [address] PCP: Janith Lima, MD  Subjective: Chief Complaint  Patient presents with   Left Knee - Pain    HPI: Jeannene Patella is a 61 year old patient with 1 week history of left knee pain with no known injury.  Reports constant pain in the anterior aspect of the knee.  Wakes her from sleep at night.  At times the knee gets very stiff.  She works 2 jobs at [employer].  She does describe some swelling as well as weakness and giving way.  She uses a knee sleeve.  All of her pain is anterior.  She cannot take Motrin secondary to renal issues.  Slightly improved over the past several days.  Denies any fevers or chills.              ROS: All systems reviewed are negative as they relate to the chief complaint within the history of present illness.  Patient denies  fevers or chills.   Assessment & Plan: Visit Diagnoses:  1. Left knee pain, unspecified chronicity     Plan: Impression is unclear etiology anterior left knee pain but it is fairly symptomatic for [name].  Interfering with her work.  No effusion and normal ligamentous and meniscal exam today.  Affecting her gait.  Could be early prepatellar bursitis which may or may not be infected.  Does not look like there is an intra-articular source of the pain.  Based on level of debilitation for the patient plan MRI left knee to evaluate anterior pain.  Follow-up after that study.  Follow-Up Instructions: No follow-ups on file.   Orders:  Orders Placed This Encounter  Procedures   MR Knee Left w/o contrast   No orders of the defined types were placed in this encounter.     Procedures: No procedures performed   Clinical Data: No additional findings.  Objective: Vital Signs: There were no vitals taken for this visit.  Physical Exam:    Constitutional: Patient appears well-developed HEENT:  Head: Normocephalic Eyes:EOM are normal Neck: Normal range of motion Cardiovascular: Normal rate Pulmonary/chest: Effort normal Neurologic: Patient is alert Skin: Skin is warm Psychiatric: Patient has normal mood and affect   Ortho Exam: Ortho exam demonstrates antalgic gait to the left.  No effusion in the knee.  Collateral crucial ligaments are stable.  Does have anterior retinacular tenderness with slight warmth on the left patellar region compared to the right.  Collateral and cruciate ligaments are stable.  No patellofemoral crepitus is present.  No groin pain on the left with internal/external rotation of the leg.  Pedal pulses palpable with intact ankle dorsiflexion.  No discrete fluid collection or fluctuance  palpable around the patella at this time  Specialty Comments:  No specialty comments available.  Imaging: No results found.   PMFS History: Patient Active Problem List   Diagnosis Date Noted   Acute pain of left knee 05/21/2022   Injury of medial collateral ligament (MCL) of knee 05/21/2022   Vulvar lesion 01/10/2022   OAB (overactive bladder) 11/22/2021   Stage 3a chronic kidney disease ([place]) 11/02/2020   Hyperplastic colonic polyp 06/29/2020   Cervical cancer screening 06/28/2020   Vitamin D deficiency disease 04/29/2019   Essential  hypertension 04/29/2019   Hyperlipidemia with target LDL less than 100 04/29/2019   Bilateral edema of lower extremity 05/27/2017   Routine general medical examination at a health care facility 06/04/2016   Visit for screening mammogram 06/04/2016   Insomnia 08/17/2015   Rectal incontinence 08/17/2015   Pain due to interstitial cystitis 09/16/2014   ESOPHAGITIS, REFLUX 08/14/2007   Past Medical History:  Diagnosis Date   Arthritis    right hand   Chicken pox    Childhood   Diverticulosis of colon (without mention of hemorrhage)    GERD (gastroesophageal reflux  disease)    Hiatal hernia    Hyperplastic colon polyp    Mass of finger of right hand 02/2013   right middle finger   Seasonal allergies    Sinus congestion 03/16/2013   cough, stuffy and runny nose   Urine incontinence    Vitamin D deficiency    Wears partial dentures    upper    Family History  Problem Relation Age of Onset   Kidney disease Father    Heart disease Father    Stroke Father    Hypertension Father    Heart disease Mother    Depression Mother    Cancer Sister        lung   Breast cancer Sister        both breast removed   Colon cancer Neg Hx    Esophageal cancer Neg Hx     Past Surgical History:  Procedure Laterality Date   COLONOSCOPY  08/14/2007   562.10, 211.3   FOOT SURGERY Bilateral    exc. ingrown toenail great toe   TUBAL LIGATION  09/02/2002   Social History   Occupational History    Employer: [employer]  Tobacco Use   Smoking status: Former   Smokeless tobacco: Never   Tobacco comments:    quit smoking 20 years ago  Vaping Use   Vaping Use: Never used  Substance and Sexual Activity   Alcohol use: Yes    Alcohol/week: 14.0 standard drinks of alcohol    Types: 14 Glasses of wine per week    Comment: occasionally   Drug use: No   Sexual activity: Yes    Partners: Male    Birth control/protection: Post-menopausal    Comment: 1st intercourse- 18, partners- 4,

## 2022-05-28 NOTE — Progress Notes (Signed)
Subjective:  Patient ID: Marie Torres, female    DOB: 25-Nov-1960,  MRN: [id number]  Chief Complaint  Patient presents with   Routine Post Op     follow up 6 weeks left foot bone spur, no pain, numbness, or tingling     61 y.o. female presents with the above complaint.  Patient presents with complaint of left submetatarsal 2 and 5 porokeratotic lesion.  Patient states it came back again and is gotten more painful.  She states that the debridement did help some however it continues to come back she has failed all conservative treatment options she tried to get modification padding offloading and which has helped.  She wears orthotics.  She would like to discuss surgical options at this time.  Pain scale 7 out of 10 hurts with ambulation especially when it gets bigger.   Review of Systems: Negative except as noted in the HPI. Denies N/V/F/Ch.  Past Medical History:  Diagnosis Date   Arthritis    right hand   Chicken pox    Childhood   Diverticulosis of colon (without mention of hemorrhage)    GERD (gastroesophageal reflux disease)    Hiatal hernia    Hyperplastic colon polyp    Mass of finger of right hand 02/2013   right middle finger   Seasonal allergies    Sinus congestion 03/16/2013   cough, stuffy and runny nose   Urine incontinence    Vitamin D deficiency    Wears partial dentures    upper    Current Outpatient Medications:    Cholecalciferol 1.25 MG (50000 UT) capsule, TAKE 1 CAPSULE BY MOUTH ONE TIME PER WEEK, Disp: , Rfl:    colchicine 0.6 MG tablet, Take 1 tablet every day by oral route., Disp: , Rfl:    diazepam (VALIUM) 5 MG tablet, 1 po 30 mins prior to MRI, Disp: , Rfl:    fluticasone (FLONASE) 50 MCG/ACT nasal spray, Place 1 spray into both nostrils daily., Disp: 16 g, Rfl: 0   lidocaine (XYLOCAINE) 5 % ointment, Apply 1 application topically as needed., Disp: 35.44 g, Rfl: 0   loratadine (CLARITIN) 10 MG tablet, Take 1 tablet (10 mg total) by mouth daily., Disp:  30 tablet, Rfl: 0   meloxicam (MOBIC) 7.5 MG tablet, TAKE 1 TABLET TWICE A DAY FOR 2 WEEKS,THEN AS NEEDED, Disp: , Rfl:    methocarbamol (ROBAXIN) 500 MG tablet, TAKE 1 TABLET BY MOUTH TWICE A DAY AS NEEDED FOR SPASMS, Disp: , Rfl:    phenazopyridine (PYRIDIUM) 100 MG tablet, Take 1 tablet (100 mg total) by mouth 3 (three) times daily as needed for pain., Disp: 15 tablet, Rfl: 0   rosuvastatin (CRESTOR) 5 MG tablet, Take 1 tablet (5 mg total) by mouth daily., Disp: 90 tablet, Rfl: 1   solifenacin (VESICARE) 10 MG tablet, TAKE 1 TABLET BY MOUTH EVERY DAY, Disp: 90 tablet, Rfl: 1   triamterene-hydrochlorothiazide (DYAZIDE) 37.5-25 MG capsule, TAKE 1 CAPSULE BY MOUTH EVERY DAY, Disp: 90 capsule, Rfl: 0  Social History   Tobacco Use  Smoking Status Former  Smokeless Tobacco Never  Tobacco Comments   quit smoking 20 years ago    Allergies  Allergen Reactions   Shellfish Allergy Hives   Objective:  There were no vitals filed for this visit. There is no height or weight on file to calculate BMI. Constitutional Well developed. Well nourished.  Vascular Dorsalis pedis pulses palpable bilaterally. Posterior tibial pulses palpable bilaterally. Capillary refill normal to all digits.  No cyanosis or clubbing noted. Pedal hair growth normal.  Neurologic Normal speech. Oriented to person, place, and time. Epicritic sensation to light touch grossly present bilaterally.  Dermatologic Hyperkeratotic lesion with central nucleated core noted to left submetatarsal 2 6 and 5 with underlying plantarflexed second metatarsal putting excessive stress on the lesion.  Orthopedic: Normal joint ROM without pain or crepitus bilaterally. No visible deformities. No bony tenderness.   Radiographs: None Assessment:   1. Plantar flexed metatarsal, left   2. Encounter for preoperative examination for general surgical procedure    Plan:  Patient was evaluated and treated and all questions answered.  Left  plantarflexed segment second/fifth metatarsal with underlying porokeratotic lesion -All questions and concerns were discussed with the patient in extensive detail.  -At this time patient has failed all conservative treatment options she would like to discuss left second and fifth floating osteotomy.  I discussed my preoperative intraoperative postop plan in extensive detail she states understand like to proceed with surgery. -I will give her surgical shoe she can be weightbearing as tolerated in surgical shoe. -Informed surgical risk consent was reviewed and read aloud to the patient.  I reviewed the films.  I have discussed my findings with the patient in great detail.  I have discussed all risks including but not limited to infection, stiffness, scarring, limp, disability, deformity, damage to blood vessels and nerves, numbness, poor healing, need for braces, arthritis, chronic pain, amputation, death.  All benefits and realistic expectations discussed in great detail.  I have made no promises as to the outcome.  I have provided realistic expectations.  I have offered the patient a 2nd opinion, which they have declined and assured me they preferred to proceed despite the risks   No follow-ups on file.

## 2022-06-15 ENCOUNTER — Ambulatory Visit
Admission: RE | Admit: 2022-06-15 | Discharge: 2022-06-15 | Disposition: A | Discharge status: Home / Self Care | Payer: BC Managed Care – PPO | Source: Ambulatory Visit | Attending: Orthopedic Surgery | Admitting: Orthopedic Surgery

## 2022-06-15 DIAGNOSIS — M25562 Pain in left knee: Secondary | ICD-10-CM

## 2022-06-19 ENCOUNTER — Telehealth: Payer: Self-pay | Admitting: Orthopedic Surgery

## 2022-06-19 NOTE — Telephone Encounter (Signed)
Called patient left message to return call to schedule an MRI review with Dr. Marlou Sa

## 2022-06-24 ENCOUNTER — Ambulatory Visit: Payer: BC Managed Care – PPO | Admitting: Family Medicine

## 2022-06-24 ENCOUNTER — Encounter: Payer: Self-pay | Admitting: Family Medicine

## 2022-06-24 VITALS — BP 116/78 | HR 85 | Temp 97.6°F | Ht 61.0 in | Wt 161.0 lb

## 2022-06-24 DIAGNOSIS — J309 Allergic rhinitis, unspecified: Secondary | ICD-10-CM | POA: Diagnosis not present

## 2022-06-24 DIAGNOSIS — J069 Acute upper respiratory infection, unspecified: Secondary | ICD-10-CM | POA: Diagnosis not present

## 2022-06-24 DIAGNOSIS — R0981 Nasal congestion: Secondary | ICD-10-CM

## 2022-06-24 LAB — POC COVID19 BINAXNOW: SARS Coronavirus 2 Ag: NEGATIVE

## 2022-06-24 MED ORDER — FLUTICASONE PROPIONATE 50 MCG/ACT NA SUSP: 1.0000 | Freq: Every day | NASAL | 1 refills | Status: DC

## 2022-06-24 MED ORDER — LEVOCETIRIZINE DIHYDROCHLORIDE 5 MG PO TABS
5.0000 mg | ORAL_TABLET | Freq: Every evening | ORAL | 2 refills | Status: DC
Start: 2022-06-24 — End: 2024-03-10

## 2022-06-24 NOTE — Patient Instructions (Signed)
Your COVID test today is negative.  I do recommend that you take another test tomorrow at home.  I sent in 2 prescriptions, Flonase nasal spray and Xyzal for runny nose and allergy symptoms.  You may take Tylenol or ibuprofen for headache or sore throat or pain.  Let us know if you are getting worse or not improving by Thursday or Friday.

## 2022-06-24 NOTE — Progress Notes (Signed)
Subjective:  Marie Torres is a 61 y.o. female who presents for a 3 day history of rhinorrhea, nasal congestion, sneezing and itchy watery eyes.  Denies fever, chills, headache, dizziness, ear pain, sore throat, cough, chest pain, palpitations, shortness of breath, abdominal pain, nausea, vomiting or diarrhea.  History of allergies and is not currently taking allergy medication.  States she has been out of her medicine.  Treatment to date: nothing  Denies sick contacts.  No other aggravating or relieving factors.  No other c/o.  ROS as in subjective.   Objective: Vitals:   06/24/22 1114  BP: 116/78  Pulse: 85  Temp: 97.6 F (36.4 C)  SpO2: 98%    General appearance: Alert, WD/WN, no distress, mildly ill appearing                             Skin: warm, no rash                           Head: no sinus tenderness                            Eyes: conjunctiva normal, corneas clear, PERRLA                            Ears: pearly TMs, external ear canals normal                          Nose: septum midline, turbinates swollen, with erythema and clear discharge             Mouth/throat: MMM, tongue normal, mild pharyngeal erythema                           Neck: supple, no adenopathy, no thyromegaly, nontender                          Heart: RRR, normal S1, S2, no murmurs                         Lungs: CTA bilaterally, no wheezes, rales, or rhonchi      Assessment: Viral URI  Nasal congestion - Plan: POC COVID-19  Allergic rhinitis, unspecified seasonality, unspecified trigger - Plan: levocetirizine (XYZAL) 5 MG tablet, fluticasone (FLONASE) 50 MCG/ACT nasal spray   Plan: Discussed diagnosis and treatment of URI.  Negative COVID test today.  I do recommend that she take another COVID test tomorrow at home.  Suspect allergies may be playing a role as well.  Xyzal and Flonase prescribed.  Suggested symptomatic OTC remedies. Nasal saline spray for congestion.  Tylenol or  Ibuprofen OTC for fever and malaise.  Call/return in 2-3 days if symptoms aren't resolving.

## 2022-06-26 ENCOUNTER — Encounter: Payer: BC Managed Care – PPO | Admitting: Podiatry

## 2022-07-06 ENCOUNTER — Ambulatory Visit (HOSPITAL_COMMUNITY)
Admission: EM | Admit: 2022-07-06 | Discharge: 2022-07-06 | Disposition: A | Discharge status: Home / Self Care | Payer: BC Managed Care – PPO | Attending: Physician Assistant | Admitting: Physician Assistant

## 2022-07-06 ENCOUNTER — Encounter (HOSPITAL_COMMUNITY): Payer: Self-pay

## 2022-07-06 DIAGNOSIS — I129 Hypertensive chronic kidney disease with stage 1 through stage 4 chronic kidney disease, or unspecified chronic kidney disease: Secondary | ICD-10-CM | POA: Diagnosis not present

## 2022-07-06 DIAGNOSIS — J069 Acute upper respiratory infection, unspecified: Secondary | ICD-10-CM

## 2022-07-06 DIAGNOSIS — N1831 Chronic kidney disease, stage 3a: Secondary | ICD-10-CM | POA: Diagnosis not present

## 2022-07-06 DIAGNOSIS — U071 COVID-19: Secondary | ICD-10-CM | POA: Diagnosis present

## 2022-07-06 DIAGNOSIS — Z87891 Personal history of nicotine dependence: Secondary | ICD-10-CM | POA: Insufficient documentation

## 2022-07-06 LAB — RESP PANEL BY RT-PCR (FLU A&B, COVID) ARPGX2
Influenza A by PCR: NEGATIVE
Influenza B by PCR: NEGATIVE
SARS Coronavirus 2 by RT PCR: POSITIVE — AB

## 2022-07-06 MED ORDER — BENZONATATE 100 MG PO CAPS: 100.0000 mg | ORAL_CAPSULE | Freq: Three times a day (TID) | ORAL | 0 refills | Status: DC

## 2022-07-06 NOTE — Discharge Instructions (Signed)
We will contact you with your lab work.  Use Tessalon for cough.  Use Mucinex, Flonase, Tylenol for symptom relief.  Make sure that you rest and drink plenty of fluid.  If you have any worsening symptoms you need to go to the emergency room including shortness of breath, headache, vision change, dizziness, nausea/vomiting.  If symptoms not improving by next week please return for reevaluation.

## 2022-07-06 NOTE — ED Provider Notes (Signed)
[facility]    CSN: [id number] Arrival date & time: 07/06/22  1309      History   Chief Complaint Chief Complaint  Patient presents with   Cough    HPI Marie Torres is a 61 y.o. female.   Patient presents today with a 3-day history of URI symptoms.  Reports cough, sore throat, fatigue, chills, subjective fever.  Denies any chest pain, shortness of breath, nausea, vomiting, diarrhea.  Denies any specific sick contacts but does report that several people at her place of employment have been sick but she does not know their final diagnosis.  She has not had COVID in the past.  She has had COVID vaccines.  She has been trying over-the-counter medications without improvement of symptoms.  She does have a past medical history of hypertension but denies any history of diabetes, immunosuppression, malignancy, chronic liver/kidney disease, asthma, COPD.  She is a former smoker but quit many years ago.  Denies any recent antibiotic or steroid use.    Past Medical History:  Diagnosis Date   Arthritis    right hand   Chicken pox    Childhood   Diverticulosis of colon (without mention of hemorrhage)    GERD (gastroesophageal reflux disease)    Hiatal hernia    Hyperplastic colon polyp    Mass of finger of right hand 02/2013   right middle finger   Seasonal allergies    Sinus congestion 03/16/2013   cough, stuffy and runny nose   Urine incontinence    Vitamin D deficiency    Wears partial dentures    upper    Patient Active Problem List   Diagnosis Date Noted   Acute pain of left knee 05/21/2022   Injury of medial collateral ligament (MCL) of knee 05/21/2022   Vulvar lesion 01/10/2022   OAB (overactive bladder) 11/22/2021   Stage 3a chronic kidney disease ([place]) 11/02/2020   Hyperplastic colonic polyp 06/29/2020   Cervical cancer screening 06/28/2020   Vitamin D deficiency disease 04/29/2019   Essential hypertension 04/29/2019   Hyperlipidemia with target LDL less  than 100 04/29/2019   Bilateral edema of lower extremity 05/27/2017   Routine general medical examination at a health care facility 06/04/2016   Visit for screening mammogram 06/04/2016   Insomnia 08/17/2015   Rectal incontinence 08/17/2015   Pain due to interstitial cystitis 09/16/2014   ESOPHAGITIS, REFLUX 08/14/2007    Past Surgical History:  Procedure Laterality Date   COLONOSCOPY  08/14/2007   562.10, 211.3   FOOT SURGERY Bilateral    exc. ingrown toenail great toe   TUBAL LIGATION  09/02/2002    OB History     Gravida  1   Para  1   Term  1   Preterm      AB      Living  1      SAB      IAB      Ectopic      Multiple      Live Births               Home Medications    Prior to Admission medications   Medication Sig Start Date End Date Taking? Authorizing Provider  Cholecalciferol 1.25 MG (50000 UT) capsule TAKE 1 CAPSULE BY MOUTH ONE TIME PER WEEK    [provider]  colchicine 0.6 MG tablet Take 1 tablet every day by oral route.    [provider]  diazepam (VALIUM) 5  MG tablet 1 po 30 mins prior to MRI    [provider]  fluticasone (FLONASE) 50 MCG/ACT nasal spray Place 1 spray into both nostrils daily. 06/24/22   Henson, Vickie L, NP-C  levocetirizine (XYZAL) 5 MG tablet Take 1 tablet (5 mg total) by mouth every evening. 06/24/22   Henson, Vickie L, NP-C  lidocaine (XYLOCAINE) 5 % ointment Apply 1 application topically as needed. 09/28/21   Felipa Furnace, DPM  meloxicam (MOBIC) 7.5 MG tablet TAKE 1 TABLET TWICE A DAY FOR 2 WEEKS,THEN AS NEEDED    [provider]  methocarbamol (ROBAXIN) 500 MG tablet TAKE 1 TABLET BY MOUTH TWICE A DAY AS NEEDED FOR SPASMS    [provider]  phenazopyridine (PYRIDIUM) 100 MG tablet Take 1 tablet (100 mg total) by mouth 3 (three) times daily as needed for pain. 04/23/22 04/23/23  Plotnikov, Evie Lacks, MD  rosuvastatin (CRESTOR) 5 MG tablet Take 1 tablet (5 mg total) by  mouth daily. 11/21/21   Janith Lima, MD  solifenacin (VESICARE) 10 MG tablet TAKE 1 TABLET BY MOUTH EVERY DAY 05/22/22   Janith Lima, MD  triamterene-hydrochlorothiazide (DYAZIDE) 37.5-25 MG capsule TAKE 1 CAPSULE BY MOUTH EVERY DAY 12/30/21   Janith Lima, MD    Family History Family History  Problem Relation Age of Onset   Kidney disease Father    Heart disease Father    Stroke Father    Hypertension Father    Heart disease Mother    Depression Mother    Cancer Sister        lung   Breast cancer Sister        both breast removed   Colon cancer Neg Hx    Esophageal cancer Neg Hx     Social History Social History   Tobacco Use   Smoking status: Former   Smokeless tobacco: Never   Tobacco comments:    quit smoking 20 years ago  Vaping Use   Vaping Use: Never used  Substance Use Topics   Alcohol use: Yes    Alcohol/week: 14.0 standard drinks of alcohol    Types: 14 Glasses of wine per week    Comment: occasionally   Drug use: No     Allergies   Shellfish allergy   Review of Systems Review of Systems  Constitutional:  Positive for activity change, chills, fatigue and fever. Negative for appetite change.  HENT:  Positive for congestion. Negative for sinus pressure, sneezing and sore throat.   Respiratory:  Positive for cough. Negative for shortness of breath.   Cardiovascular:  Negative for chest pain.  Gastrointestinal:  Negative for abdominal pain, diarrhea, nausea and vomiting.  Neurological:  Negative for dizziness, light-headedness and headaches.     Physical Exam Triage Vital Signs ED Triage Vitals  Enc Vitals Group     BP 07/06/22 1446 137/72     Pulse Rate 07/06/22 1446 88     Resp 07/06/22 1446 16     Temp 07/06/22 1446 99 F (37.2 C)     Temp Source 07/06/22 1446 Oral     SpO2 07/06/22 1446 98 %     Weight --      Height --      Head Circumference --      Peak Flow --      Pain Score 07/06/22 1447 0     Pain Loc --      Pain Edu?  --  Excl. in GC? --    No data found.  Updated Vital Signs BP 137/72 (BP Location: Right Arm)   Pulse 88   Temp 99 F (37.2 C) (Oral)   Resp 16   SpO2 98%   Visual Acuity Right Eye Distance:   Left Eye Distance:   Bilateral Distance:    Right Eye Near:   Left Eye Near:    Bilateral Near:     Physical Exam Vitals reviewed.  Constitutional:      General: She is awake. She is not in acute distress.    Appearance: Normal appearance. She is well-developed. She is not ill-appearing.     Comments: Very pleasant female presentation no acute distress sitting comfortably in exam room  HENT:     Head: Normocephalic and atraumatic.     Right Ear: Tympanic membrane, ear canal and external ear normal. Tympanic membrane is not erythematous or bulging.     Left Ear: Tympanic membrane, ear canal and external ear normal. Tympanic membrane is not erythematous or bulging.     Nose:     Right Sinus: No maxillary sinus tenderness or frontal sinus tenderness.     Left Sinus: No maxillary sinus tenderness or frontal sinus tenderness.     Mouth/Throat:     Dentition: Has dentures.     Pharynx: Uvula midline. No oropharyngeal exudate or posterior oropharyngeal erythema.  Cardiovascular:     Rate and Rhythm: Normal rate and regular rhythm.     Heart sounds: Normal heart sounds, S1 normal and S2 normal. No murmur heard. Pulmonary:     Effort: Pulmonary effort is normal.     Breath sounds: Normal breath sounds. No wheezing, rhonchi or rales.     Comments: Clear to auscultation bilaterally Psychiatric:        Behavior: Behavior is cooperative.      UC Treatments / Results  Labs (all labs ordered are listed, but only abnormal results are displayed) Labs Reviewed  RESP PANEL BY RT-PCR (FLU A&B, COVID) [id number]    EKG   Radiology No results found.  Procedures Procedures (including critical care time)  Medications Ordered in UC Medications - No data to display  Initial  Impression / Assessment and Plan / UC Course  I have reviewed the triage vital signs and the nursing notes.  Pertinent labs & imaging results that were available during my care of the patient were reviewed by me and considered in my medical decision making (see chart for details).     Patient is well-appearing, afebrile, nontoxic, not tachycardic.  No evidence of acute infection on physical exam that would warrant initiation of antibiotics.  Suspect viral etiology.  COVID/flu testing was obtained today-results pending.  Patient is within 5 days of symptom onset and is a candidate for antivirals based on her age and history of hypertension.  Unfortunately, she has several medications that she takes regularly including Vesicare, colchicine, rosuvastatin that would need to be adjusted.  Patient also has a prescription for diazepam but reports that she does not take this regularly.  She does have a kidney function as of today.  She would most likely benefit from Paxlovid but would need to make many medication adjustments.  She will think about this and consider back fluid versus molnipivir based on her COVID result.  She was given Tessalon for cough.  Recommended over-the-counter medication including Tylenol, Mucinex, Flonase for symptom relief.  She is to rest and drink plenty of fluid.  If her symptoms  not improving within a week she is to return for reevaluation.  If at any point she has worsening symptoms including chest pain, shortness of breath, fever, weakness, nausea/vomiting interfering with oral intake you to go to the emergency room immediately.  Strict return precautions given.  Work excuse note provided with current CDC return to work guidelines based on COVID test result.  Final Clinical Impressions(s) / UC Diagnoses   Final diagnoses:  Upper respiratory tract infection, unspecified type     Discharge Instructions      We will contact you with your lab work.  Use Tessalon for cough.  Use  Mucinex, Flonase, Tylenol for symptom relief.  Make sure that you rest and drink plenty of fluid.  If you have any worsening symptoms you need to go to the emergency room including shortness of breath, headache, vision change, dizziness, nausea/vomiting.  If symptoms not improving by next week please return for reevaluation.     ED Prescriptions   None    PDMP not reviewed this encounter.   Terrilee Croak, PA-C 07/06/22 1527

## 2022-07-06 NOTE — ED Triage Notes (Signed)
Pt states cough,sore throat and fatigue for the past 3 days

## 2022-07-07 ENCOUNTER — Telehealth: Payer: Self-pay | Admitting: Physician Assistant

## 2022-07-07 MED ORDER — NIRMATRELVIR/RITONAVIR (PAXLOVID)TABLET: 3.0000 | ORAL_TABLET | Freq: Two times a day (BID) | ORAL | 0 refills | Status: AC

## 2022-07-07 NOTE — Telephone Encounter (Signed)
Patient tested positive for COVID-19.  She is a candidate for antiviral therapy given past medical history and age.  We discussed that the most effective medication is Paxlovid.  Patient reports that the only medication she is taking is her blood pressure medication (triamterene/hydrochlorothiazide).  She does not have prescription for Valium as this is used only for procedures.  She is not taking her Crestor regularly but we did discuss that this should be stopped while on Paxlovid and not restart it for 3 days after completing course.  She does not take colchicine regularly and no longer has this medication available.  This was removed from her medication list.  Also reports that she is not taking Vesicare and so this was also removed from her list.  Discussed quarantine procedures.  If she has any worsening symptoms she needs to be seen immediately.  All questions answered to patient satisfaction.

## 2022-07-10 ENCOUNTER — Encounter: Payer: BC Managed Care – PPO | Admitting: Podiatry

## 2022-07-12 ENCOUNTER — Other Ambulatory Visit: Payer: Self-pay | Admitting: Internal Medicine

## 2022-07-12 DIAGNOSIS — I10 Essential (primary) hypertension: Secondary | ICD-10-CM

## 2022-07-15 ENCOUNTER — Ambulatory Visit: Payer: BC Managed Care – PPO | Admitting: Orthopedic Surgery

## 2022-07-24 ENCOUNTER — Ambulatory Visit (INDEPENDENT_AMBULATORY_CARE_PROVIDER_SITE_OTHER): Payer: BC Managed Care – PPO | Admitting: Orthopedic Surgery

## 2022-07-24 DIAGNOSIS — M25562 Pain in left knee: Secondary | ICD-10-CM | POA: Diagnosis not present

## 2022-07-25 ENCOUNTER — Encounter: Payer: Self-pay | Admitting: Orthopedic Surgery

## 2022-07-25 NOTE — Progress Notes (Signed)
Office Visit Note   Patient: Marie Torres           Date of Birth: Mar 01, 1961           MRN: [id number] Visit Date: 07/24/2022 Requested by: Janith Lima, MD [address] [place],  [place] [address] PCP: Janith Lima, MD  Subjective: Chief Complaint  Patient presents with   Other     Scan review    HPI: Marie Torres is a 61 year old patient with left knee pain.  She is here for review MRI scan of the left knee.  MRI scan shows quadriceps tendinopathy and mild tricompartmental arthritis with cruciate and collateral ligaments intact in the mid no meniscal pathology.  I reviewed the scan with the patient.              ROS: All systems reviewed are negative as they relate to the chief complaint within the history of present illness.  Patient denies  fevers or chills.   Assessment & Plan: Visit Diagnoses:  1. Left knee pain, unspecified chronicity     Plan: Impression is left knee pain with no definite operative problem.  Overall she is doing better.  Continue with nonweightbearing quad strengthening exercises and we could consider injection if her symptoms recur.  Follow-up as needed.  Follow-Up Instructions: No follow-ups on file.   Orders:  No orders of the defined types were placed in this encounter.  No orders of the defined types were placed in this encounter.     Procedures: No procedures performed   Clinical Data: No additional findings.  Objective: Vital Signs: There were no vitals taken for this visit.  Physical Exam:   Constitutional: Patient appears well-developed HEENT:  Head: Normocephalic Eyes:EOM are normal Neck: Normal range of motion Cardiovascular: Normal rate Pulmonary/chest: Effort normal Neurologic: Patient is alert Skin: Skin is warm Psychiatric: Patient has normal mood and affect   Ortho Exam: Ortho exam demonstrates full active and passive range of motion of the left knee.  No focal joint line tenderness is present.  No  tenderness to palpation along the superior pole of the patella.  No other masses lymphadenopathy or skin changes noted on the left knee region.  Specialty Comments:  No specialty comments available.  Imaging: No results found.   PMFS History: Patient Active Problem List   Diagnosis Date Noted   Acute pain of left knee 05/21/2022   Injury of medial collateral ligament (MCL) of knee 05/21/2022   Vulvar lesion 01/10/2022   OAB (overactive bladder) 11/22/2021   Stage 3a chronic kidney disease ([place]) 11/02/2020   Hyperplastic colonic polyp 06/29/2020   Cervical cancer screening 06/28/2020   Vitamin D deficiency disease 04/29/2019   Essential hypertension 04/29/2019   Hyperlipidemia with target LDL less than 100 04/29/2019   Bilateral edema of lower extremity 05/27/2017   Routine general medical examination at a health care facility 06/04/2016   Visit for screening mammogram 06/04/2016   Insomnia 08/17/2015   Rectal incontinence 08/17/2015   Pain due to interstitial cystitis 09/16/2014   ESOPHAGITIS, REFLUX 08/14/2007   Past Medical History:  Diagnosis Date   Arthritis    right hand   Chicken pox    Childhood   Diverticulosis of colon (without mention of hemorrhage)    GERD (gastroesophageal reflux disease)    Hiatal hernia    Hyperplastic colon polyp    Mass of finger of right hand 02/2013   right middle finger   Seasonal allergies  Sinus congestion 03/16/2013   cough, stuffy and runny nose   Urine incontinence    Vitamin D deficiency    Wears partial dentures    upper    Family History  Problem Relation Age of Onset   Kidney disease Father    Heart disease Father    Stroke Father    Hypertension Father    Heart disease Mother    Depression Mother    Cancer Sister        lung   Breast cancer Sister        both breast removed   Colon cancer Neg Hx    Esophageal cancer Neg Hx     Past Surgical History:  Procedure Laterality Date   COLONOSCOPY  08/14/2007    562.10, 211.3   FOOT SURGERY Bilateral    exc. ingrown toenail great toe   TUBAL LIGATION  09/02/2002   Social History   Occupational History    Employer: [employer]  Tobacco Use   Smoking status: Former   Smokeless tobacco: Never   Tobacco comments:    quit smoking 20 years ago  Vaping Use   Vaping Use: Never used  Substance and Sexual Activity   Alcohol use: Yes    Alcohol/week: 14.0 standard drinks of alcohol    Types: 14 Glasses of wine per week    Comment: occasionally   Drug use: No   Sexual activity: Yes    Partners: Male    Birth control/protection: Post-menopausal    Comment: 1st intercourse- 26, partners- 4,

## 2022-08-05 ENCOUNTER — Other Ambulatory Visit: Payer: Self-pay | Admitting: Internal Medicine

## 2022-08-05 DIAGNOSIS — E785 Hyperlipidemia, unspecified: Secondary | ICD-10-CM

## 2022-08-16 ENCOUNTER — Telehealth: Payer: Self-pay | Admitting: Podiatry

## 2022-08-16 NOTE — Telephone Encounter (Signed)
DOS: 09/16/2022  BCBS  Procedure: Metatarsal Osteotomy 2nd & 5th Lt (28308)  DX: D17.616  Deductible: $200 with $0 remaining Out-of-Pocket: $2,500 with $2,072.52 remaining CoInsurance: 20%  Prior Authorization is Not Required per [facility]  Call Reference #: [id number]

## 2022-08-26 ENCOUNTER — Ambulatory Visit (HOSPITAL_COMMUNITY)
Admission: EM | Admit: 2022-08-26 | Discharge: 2022-08-26 | Disposition: A | Discharge status: Home / Self Care | Payer: BC Managed Care – PPO

## 2022-08-26 ENCOUNTER — Encounter (HOSPITAL_COMMUNITY): Payer: Self-pay

## 2022-08-26 DIAGNOSIS — Z711 Person with feared health complaint in whom no diagnosis is made: Secondary | ICD-10-CM

## 2022-08-26 DIAGNOSIS — Z5189 Encounter for other specified aftercare: Secondary | ICD-10-CM

## 2022-08-26 NOTE — Discharge Instructions (Signed)
Everything looks good!  You can moisturize the area to keep the skin from drying out.  See your primary care with any concerns.

## 2022-08-26 NOTE — ED Provider Notes (Signed)
[facility]    CSN: [id number] Arrival date & time: 08/26/22  1833     History   Chief Complaint Chief Complaint  Patient presents with   Laceration    HPI Marie Torres is a 61 y.o. female.  Presents for wound check Small lac to right index finger on 10/9, 3 weeks ago Reports she was evaluated although I do not see any history of this in chart review. No sutures or glue placed She is here today because the area feels hard Looks "dark" in that area No pain  Past Medical History:  Diagnosis Date   Arthritis    right hand   Chicken pox    Childhood   Diverticulosis of colon (without mention of hemorrhage)    GERD (gastroesophageal reflux disease)    Hiatal hernia    Hyperplastic colon polyp    Mass of finger of right hand 02/2013   right middle finger   Seasonal allergies    Sinus congestion 03/16/2013   cough, stuffy and runny nose   Urine incontinence    Vitamin D deficiency    Wears partial dentures    upper    Patient Active Problem List   Diagnosis Date Noted   Acute pain of left knee 05/21/2022   Injury of medial collateral ligament (MCL) of knee 05/21/2022   Vulvar lesion 01/10/2022   OAB (overactive bladder) 11/22/2021   Stage 3a chronic kidney disease ([place]) 11/02/2020   Hyperplastic colonic polyp 06/29/2020   Cervical cancer screening 06/28/2020   Vitamin D deficiency disease 04/29/2019   Essential hypertension 04/29/2019   Hyperlipidemia with target LDL less than 100 04/29/2019   Bilateral edema of lower extremity 05/27/2017   Routine general medical examination at a health care facility 06/04/2016   Visit for screening mammogram 06/04/2016   Insomnia 08/17/2015   Rectal incontinence 08/17/2015   Pain due to interstitial cystitis 09/16/2014   ESOPHAGITIS, REFLUX 08/14/2007    Past Surgical History:  Procedure Laterality Date   COLONOSCOPY  08/14/2007   562.10, 211.3   FOOT SURGERY Bilateral    exc. ingrown toenail great toe    TUBAL LIGATION  09/02/2002    OB History     Gravida  1   Para  1   Term  1   Preterm      AB      Living  1      SAB      IAB      Ectopic      Multiple      Live Births               Home Medications    Prior to Admission medications   Medication Sig Start Date End Date Taking? Authorizing Provider  benzonatate (TESSALON) 100 MG capsule Take 1 capsule (100 mg total) by mouth every 8 (eight) hours. 07/06/22   Raspet, Derry Skill, PA-C  Cholecalciferol 1.25 MG (50000 UT) capsule TAKE 1 CAPSULE BY MOUTH ONE TIME PER WEEK    [provider]  diazepam (VALIUM) 5 MG tablet 1 po 30 mins prior to MRI    [provider]  fluticasone (FLONASE) 50 MCG/ACT nasal spray Place 1 spray into both nostrils daily. 06/24/22   Henson, Vickie L, NP-C  levocetirizine (XYZAL) 5 MG tablet Take 1 tablet (5 mg total) by mouth every evening. 06/24/22   Henson, Vickie L, NP-C  lidocaine (XYLOCAINE) 5 % ointment Apply 1 application topically as needed. 09/28/21  Felipa Furnace, DPM  meloxicam (MOBIC) 7.5 MG tablet TAKE 1 TABLET TWICE A DAY FOR 2 WEEKS,THEN AS NEEDED    [provider]  methocarbamol (ROBAXIN) 500 MG tablet TAKE 1 TABLET BY MOUTH TWICE A DAY AS NEEDED FOR SPASMS    [provider]  phenazopyridine (PYRIDIUM) 100 MG tablet Take 1 tablet (100 mg total) by mouth 3 (three) times daily as needed for pain. 04/23/22 04/23/23  Plotnikov, Evie Lacks, MD  rosuvastatin (CRESTOR) 5 MG tablet TAKE 1 TABLET (5 MG TOTAL) BY MOUTH DAILY. 08/05/22   Janith Lima, MD  triamterene-hydrochlorothiazide (DYAZIDE) 37.5-25 MG capsule TAKE 1 CAPSULE BY MOUTH EVERY DAY 07/12/22   Janith Lima, MD    Family History Family History  Problem Relation Age of Onset   Kidney disease Father    Heart disease Father    Stroke Father    Hypertension Father    Heart disease Mother    Depression Mother    Cancer Sister        lung   Breast cancer Sister        both breast  removed   Colon cancer Neg Hx    Esophageal cancer Neg Hx     Social History Social History   Tobacco Use   Smoking status: Former   Smokeless tobacco: Never   Tobacco comments:    quit smoking 20 years ago  Vaping Use   Vaping Use: Never used  Substance Use Topics   Alcohol use: Yes    Alcohol/week: 14.0 standard drinks of alcohol    Types: 14 Glasses of wine per week    Comment: occasionally   Drug use: No     Allergies   Shellfish allergy   Review of Systems Review of Systems Per HPI  Physical Exam Triage Vital Signs ED Triage Vitals  Enc Vitals Group     BP 08/26/22 1908 (!) 172/73     Pulse Rate 08/26/22 1908 (!) 101     Resp --      Temp 08/26/22 1908 98.3 F (36.8 C)     Temp Source 08/26/22 1908 Oral     SpO2 08/26/22 1908 99 %     Weight --      Height --      Head Circumference --      Peak Flow --      Pain Score 08/26/22 1909 0     Pain Loc --      Pain Edu? --      Excl. in [place]? --    No data found.  Updated Vital Signs BP (!) 172/73 (BP Location: Left Arm)   Pulse (!) 101   Temp 98.3 F (36.8 C) (Oral)   SpO2 99%    Physical Exam Vitals and nursing note reviewed.  Constitutional:      General: She is not in acute distress.    Appearance: Normal appearance.  Cardiovascular:     Rate and Rhythm: Normal rate and regular rhythm.     Pulses: Normal pulses.  Pulmonary:     Effort: Pulmonary effort is normal.  Skin:    General: Skin is warm and dry.     Capillary Refill: Capillary refill takes less than 2 seconds.     Findings: No abrasion, bruising, erythema, lesion, rash or wound.     Comments: Small area of scar, healing skin right index finger. Sensation and cap refill normal.  Neurological:     Mental Status:  She is alert and oriented to person, place, and time.     UC Treatments / Results  Labs (all labs ordered are listed, but only abnormal results are displayed) Labs Reviewed - No data to  display  EKG   Radiology No results found.  Procedures Procedures (including critical care time)  Medications Ordered in UC Medications - No data to display  Initial Impression / Assessment and Plan / UC Course  I have reviewed the triage vital signs and the nursing notes.  Pertinent labs & imaging results that were available during my care of the patient were reviewed by me and considered in my medical decision making (see chart for details).  Finger looks very normal, not hard or raised, no skin changes. Discussed with patient she may have small scar in the area if she had a laceration to the skin. We discussed skin heals from the bottom up and top layer may heal last. She can try lotion/moisturizer to the area. If she's concerned about skin changes I recommend follow up with PCP who can refer her to dermatology, although again reassured that skin looks normal and healthy today  Final Clinical Impressions(s) / UC Diagnoses   Final diagnoses:  Visit for wound check  Worried well     Discharge Instructions      Everything looks good!  You can moisturize the area to keep the skin from drying out.  See your primary care with any concerns.     ED Prescriptions   None    PDMP not reviewed this encounter.   Kyra Leyland 08/26/22 1946

## 2022-08-26 NOTE — ED Triage Notes (Signed)
Pt c/o a small lac to her rt index finger at work on 10/9. States still has a raised hard area.

## 2022-08-27 ENCOUNTER — Ambulatory Visit (HOSPITAL_COMMUNITY): Payer: BC Managed Care – PPO

## 2022-09-11 ENCOUNTER — Telehealth: Payer: Self-pay

## 2022-09-11 NOTE — Telephone Encounter (Signed)
Received email from [facility] "Just spoke with patient and she stated she needed to cancel". I called [facility] to see about rescheduling, left message on voicemail. I canceled her surgery with Dr. Posey Pronto on 09/16/2022.

## 2022-09-25 ENCOUNTER — Encounter: Payer: BC Managed Care – PPO | Admitting: Podiatry

## 2022-10-09 ENCOUNTER — Encounter: Payer: BC Managed Care – PPO | Admitting: Podiatry

## 2022-10-19 ENCOUNTER — Other Ambulatory Visit: Payer: Self-pay | Admitting: Internal Medicine

## 2022-10-19 DIAGNOSIS — I10 Essential (primary) hypertension: Secondary | ICD-10-CM

## 2022-11-04 ENCOUNTER — Encounter: Payer: Self-pay | Admitting: Internal Medicine

## 2022-11-04 ENCOUNTER — Ambulatory Visit (INDEPENDENT_AMBULATORY_CARE_PROVIDER_SITE_OTHER): Payer: BC Managed Care – PPO

## 2022-11-04 ENCOUNTER — Ambulatory Visit: Payer: BC Managed Care – PPO | Admitting: Internal Medicine

## 2022-11-04 VITALS — BP 124/76 | HR 80 | Temp 97.8°F | Ht 61.0 in | Wt 158.0 lb

## 2022-11-04 DIAGNOSIS — M25421 Effusion, right elbow: Secondary | ICD-10-CM

## 2022-11-04 DIAGNOSIS — A498 Other bacterial infections of unspecified site: Secondary | ICD-10-CM

## 2022-11-04 DIAGNOSIS — M10021 Idiopathic gout, right elbow: Secondary | ICD-10-CM

## 2022-11-04 DIAGNOSIS — R300 Dysuria: Secondary | ICD-10-CM | POA: Diagnosis not present

## 2022-11-04 DIAGNOSIS — N3000 Acute cystitis without hematuria: Secondary | ICD-10-CM

## 2022-11-04 DIAGNOSIS — N1831 Chronic kidney disease, stage 3a: Secondary | ICD-10-CM

## 2022-11-04 DIAGNOSIS — I10 Essential (primary) hypertension: Secondary | ICD-10-CM | POA: Diagnosis not present

## 2022-11-04 LAB — BASIC METABOLIC PANEL
BUN: 17 mg/dL (ref 6–23)
CO2: 30 mEq/L (ref 19–32)
Calcium: 9.6 mg/dL (ref 8.4–10.5)
Chloride: 100 mEq/L (ref 96–112)
Creatinine, Ser: 0.86 mg/dL (ref 0.40–1.20)
GFR: 71.85 mL/min (ref >60.00)
Glucose, Bld: 97 mg/dL (ref 70–99)
Potassium: 3.8 mEq/L (ref 3.5–5.1)
Sodium: 138 mEq/L (ref 135–145)

## 2022-11-04 LAB — CBC WITH DIFFERENTIAL/PLATELET
Basophils Absolute: 0.1 10*3/uL (ref 0.0–0.1)
Basophils Relative: 1.1 % (ref 0.0–3.0)
Eosinophils Absolute: 0.1 10*3/uL (ref 0.0–0.7)
Eosinophils Relative: 1.4 % (ref 0.0–5.0)
HCT: 41 % (ref 36.0–46.0)
Hemoglobin: 13.6 g/dL (ref 12.0–15.0)
Lymphocytes Relative: 21.3 % (ref 12.0–46.0)
Lymphs Abs: 2 10*3/uL (ref 0.7–4.0)
MCHC: 33.3 g/dL (ref 30.0–36.0)
MCV: 89.2 fl (ref 78.0–100.0)
Monocytes Absolute: 0.8 10*3/uL (ref 0.1–1.0)
Monocytes Relative: 8.6 % (ref 3.0–12.0)
Neutro Abs: 6.4 10*3/uL (ref 1.4–7.7)
Neutrophils Relative %: 67.6 % (ref 43.0–77.0)
Platelets: 317 10*3/uL (ref 150.0–400.0)
RBC: 4.59 Mil/uL (ref 3.87–5.11)
RDW: 12.7 % (ref 11.5–15.5)
WBC: 9.5 10*3/uL (ref 4.0–10.5)

## 2022-11-04 LAB — C-REACTIVE PROTEIN: CRP: 3.6 mg/dL (ref 0.5–20.0)

## 2022-11-04 LAB — URIC ACID: Uric Acid, Serum: 7.1 mg/dL — ABNORMAL HIGH (ref 2.4–7.0)

## 2022-11-04 NOTE — Progress Notes (Signed)
Subjective:  Patient ID: Marie Torres, female    DOB: 1961-02-15  Age: 62 y.o. MRN: [id number]  CC: Urinary Tract Infection   HPI Marie Torres presents for f/up -  She complains of a 4-day history of pain, redness, swelling, and decreased range of motion in her right elbow.  There is no preceding trauma or history of injury.  She also complains of a 1 week history of dysuria with chills.  She denies abdominal/flank pain, nausea, vomiting, or hematuria.  Outpatient Medications Prior to Visit  Medication Sig Dispense Refill   benzonatate (TESSALON) 100 MG capsule Take 1 capsule (100 mg total) by mouth every 8 (eight) hours. 21 capsule 0   Cholecalciferol 1.25 MG (50000 UT) capsule TAKE 1 CAPSULE BY MOUTH ONE TIME PER WEEK     diazepam (VALIUM) 5 MG tablet 1 po 30 mins prior to MRI     fluticasone (FLONASE) 50 MCG/ACT nasal spray Place 1 spray into both nostrils daily. 16 g 1   levocetirizine (XYZAL) 5 MG tablet Take 1 tablet (5 mg total) by mouth every evening. 30 tablet 2   lidocaine (XYLOCAINE) 5 % ointment Apply 1 application topically as needed. 35.44 g 0   methocarbamol (ROBAXIN) 500 MG tablet TAKE 1 TABLET BY MOUTH TWICE A DAY AS NEEDED FOR SPASMS     phenazopyridine (PYRIDIUM) 100 MG tablet Take 1 tablet (100 mg total) by mouth 3 (three) times daily as needed for pain. 15 tablet 0   rosuvastatin (CRESTOR) 5 MG tablet TAKE 1 TABLET (5 MG TOTAL) BY MOUTH DAILY. 90 tablet 1   triamterene-hydrochlorothiazide (DYAZIDE) 37.5-25 MG capsule TAKE 1 CAPSULE BY MOUTH EVERY DAY 90 capsule 0   meloxicam (MOBIC) 7.5 MG tablet TAKE 1 TABLET TWICE A DAY FOR 2 WEEKS,THEN AS NEEDED     No facility-administered medications prior to visit.    ROS Review of Systems  Constitutional:  Positive for chills. Negative for diaphoresis, fatigue and fever.  HENT: Negative.    Eyes: Negative.   Respiratory:  Negative for cough, chest tightness, shortness of breath and wheezing.   Cardiovascular:   Negative for chest pain, palpitations and leg swelling.  Gastrointestinal:  Negative for abdominal pain, diarrhea, nausea and vomiting.  Endocrine: Negative.   Genitourinary:  Positive for dysuria. Negative for difficulty urinating.  Musculoskeletal:  Positive for arthralgias and joint swelling. Negative for back pain and neck pain.  Neurological: Negative.  Negative for dizziness and weakness.  Hematological:  Negative for adenopathy. Does not bruise/bleed easily.  Psychiatric/Behavioral: Negative.      Objective:  BP 124/76 (BP Location: Left Arm, Patient Position: Sitting, Cuff Size: Large)   Pulse 80   Temp 97.8 F (36.6 C) (Oral)   Ht '5\' 1"'$  (1.549 m)   Wt 158 lb (71.7 kg)   SpO2 97%   BMI 29.85 kg/m   BP Readings from Last 3 Encounters:  11/04/22 124/76  08/26/22 (!) 172/73  07/06/22 137/72    Wt Readings from Last 3 Encounters:  11/04/22 158 lb (71.7 kg)  06/24/22 161 lb (73 kg)  05/21/22 161 lb (73 kg)    Physical Exam Vitals reviewed.  Constitutional:      General: She is not in acute distress.    Appearance: She is not ill-appearing, toxic-appearing or diaphoretic.  HENT:     Nose: Nose normal.     Mouth/Throat:     Mouth: Mucous membranes are moist.  Eyes:     General: No scleral  icterus.    Conjunctiva/sclera: Conjunctivae normal.  Cardiovascular:     Rate and Rhythm: Normal rate and regular rhythm.     Heart sounds: No murmur heard. Pulmonary:     Effort: Pulmonary effort is normal.     Breath sounds: No stridor. No wheezing, rhonchi or rales.  Abdominal:     General: Abdomen is flat.     Palpations: There is no mass.     Tenderness: There is no abdominal tenderness. There is no guarding.     Hernia: No hernia is present.  Musculoskeletal:        General: Swelling and tenderness present. No deformity.     Right elbow: Swelling present. No deformity. Decreased range of motion. Tenderness present.     Left elbow: Normal.     Cervical back: Neck  supple.     Right lower leg: No edema.     Left lower leg: No edema.     Comments: There is erythema over the medial aspect of the right elbow extending over the humerus.  Lymphadenopathy:     Cervical: No cervical adenopathy.  Skin:    General: Skin is warm.     Findings: Erythema present.  Neurological:     General: No focal deficit present.     Mental Status: She is alert.  Psychiatric:        Mood and Affect: Mood normal.        Behavior: Behavior normal.     Lab Results  Component Value Date   WBC 9.5 11/04/2022   HGB 13.6 11/04/2022   HCT 41.0 11/04/2022   PLT 317.0 11/04/2022   GLUCOSE 97 11/04/2022   CHOL 218 (H) 11/21/2021   TRIG 185.0 (H) 11/21/2021   HDL 63.80 11/21/2021   LDLDIRECT 150.8 11/30/2013   LDLCALC 117 (H) 11/21/2021   ALT 13 01/22/2022   AST 16 01/22/2022   NA 138 11/04/2022   K 3.8 11/04/2022   CL 100 11/04/2022   CREATININE 0.86 11/04/2022   BUN 17 11/04/2022   CO2 30 11/04/2022   TSH 0.97 11/21/2021   HGBA1C 5.7 04/29/2019    [place] Extrem Up Right Ltd  Result Date: 11/05/2022 Limited musculoskeletal ultrasound of the right elbow was performed today.  Ultrasound demonstrates a moderate-large elbow effusion both anteriorly and posteriorly within the joint.  There is overlying soft tissue swelling and notable hyperemia throughout the elbow joint, indicative of inflammatory arthropathy, possibly gout.  There is no cortical defect of the olecranon, proximal insertions of the triceps tendon is noted in long axis posteriorly.  There is a small spur over the lateral aspect of the radial head, no other bony abnormality noted on limited ultrasound scan.   Moderate-large elbow effusion with notable hyperemia and inflammation  DG Elbow Complete Right  Result Date: 11/04/2022 CLINICAL DATA:  Right elbow pain and swelling for 4 days. EXAM: RIGHT ELBOW - COMPLETE 3+ VIEW COMPARISON:  None Available. FINDINGS: There is no acute fracture or dislocation. A  punctate ossific density along the lateral aspect of the radial head is indeterminate but felt unlikely to reflect acute trauma. Alignment is normal. The joint spaces are preserved. There is no erosive change. There is a large effusion. IMPRESSION: Large elbow effusion with no acute osseous abnormality. Electronically Signed   By: Valetta Mole M.D.   On: 11/04/2022 14:22     Assessment & Plan:   Xitlally was seen today for urinary tract infection.  Diagnoses and all orders for this  visit:  Swelling of right elbow- Exam, x-ray, and labs were consistent with acute gouty arthropathy.  Will control the pain and will treat with systemic steroids and colchicine. -     Cancel: CBC with Differential/Platelet; Future -     C-reactive protein; Future -     Uric acid; Future -     DG Elbow Complete Right; Future -     CBC with Differential/Platelet; Future -     CBC with Differential/Platelet -     Uric acid -     C-reactive protein  Dysuria -     CULTURE, URINE COMPREHENSIVE; Future -     CULTURE, URINE COMPREHENSIVE  Essential hypertension -     Urinalysis, Routine w reflex microscopic; Future -     Cancel: CBC with Differential/Platelet; Future -     Basic metabolic panel; Future -     CBC with Differential/Platelet; Future -     CBC with Differential/Platelet -     Basic metabolic panel -     Urinalysis, Routine w reflex microscopic  Stage 3a chronic kidney disease (HCC) -     Urinalysis, Routine w reflex microscopic; Future -     Basic metabolic panel; Future -     Basic metabolic panel -     Urinalysis, Routine w reflex microscopic  Effusion of right elbow -     Ambulatory referral to Orthopedic Surgery  Acute idiopathic gout of right elbow -     methylPREDNISolone (MEDROL DOSEPAK) 4 MG TBPK tablet; TAKE AS DIRECTED -     colchicine 0.6 MG tablet; Take 1 tablet (0.6 mg total) by mouth 2 (two) times daily. -     oxyCODONE (OXY IR/ROXICODONE) 5 MG immediate release tablet; Take 1  tablet (5 mg total) by mouth every 4 (four) hours as needed for severe pain.  Citrobacter infection -     sulfamethoxazole-trimethoprim (BACTRIM DS) 800-160 MG tablet; Take 1 tablet by mouth 2 (two) times daily for 5 days.  Acute cystitis without hematuria -     sulfamethoxazole-trimethoprim (BACTRIM DS) 800-160 MG tablet; Take 1 tablet by mouth 2 (two) times daily for 5 days.   I am having Marie Torres "[name]" start on methylPREDNISolone, colchicine, oxyCODONE, and sulfamethoxazole-trimethoprim. I am also having her maintain her lidocaine, methocarbamol, diazepam, Cholecalciferol, phenazopyridine, levocetirizine, fluticasone, benzonatate, triamterene-hydrochlorothiazide, and rosuvastatin.  Meds ordered this encounter  Medications   methylPREDNISolone (MEDROL DOSEPAK) 4 MG TBPK tablet    Sig: TAKE AS DIRECTED    Dispense:  21 tablet    Refill:  0   colchicine 0.6 MG tablet    Sig: Take 1 tablet (0.6 mg total) by mouth 2 (two) times daily.    Dispense:  60 tablet    Refill:  0   oxyCODONE (OXY IR/ROXICODONE) 5 MG immediate release tablet    Sig: Take 1 tablet (5 mg total) by mouth every 4 (four) hours as needed for severe pain.    Dispense:  30 tablet    Refill:  0   sulfamethoxazole-trimethoprim (BACTRIM DS) 800-160 MG tablet    Sig: Take 1 tablet by mouth 2 (two) times daily for 5 days.    Dispense:  10 tablet    Refill:  0     Follow-up: No follow-ups on file.  Scarlette Calico, MD

## 2022-11-05 ENCOUNTER — Encounter: Payer: Self-pay | Admitting: Sports Medicine

## 2022-11-05 ENCOUNTER — Ambulatory Visit: Payer: BC Managed Care – PPO | Admitting: Sports Medicine

## 2022-11-05 ENCOUNTER — Ambulatory Visit: Payer: Self-pay

## 2022-11-05 DIAGNOSIS — M25421 Effusion, right elbow: Secondary | ICD-10-CM

## 2022-11-05 DIAGNOSIS — M109 Gout, unspecified: Secondary | ICD-10-CM

## 2022-11-05 DIAGNOSIS — M10021 Idiopathic gout, right elbow: Secondary | ICD-10-CM | POA: Insufficient documentation

## 2022-11-05 DIAGNOSIS — M1009 Idiopathic gout, multiple sites: Secondary | ICD-10-CM | POA: Insufficient documentation

## 2022-11-05 LAB — URINALYSIS, ROUTINE W REFLEX MICROSCOPIC
Bilirubin Urine: NEGATIVE
Hgb urine dipstick: NEGATIVE
Ketones, ur: NEGATIVE
Leukocytes,Ua: NEGATIVE
Nitrite: NEGATIVE
Specific Gravity, Urine: >=1.03 — AB (ref 1.000–1.030)
Total Protein, Urine: NEGATIVE
Urine Glucose: NEGATIVE
Urobilinogen, UA: 0.2 (ref 0.0–1.0)
pH: 6 (ref 5.0–8.0)

## 2022-11-05 MED ORDER — COLCHICINE 0.6 MG PO TABS: 0.6000 mg | ORAL_TABLET | Freq: Two times a day (BID) | ORAL | 0 refills | Status: DC

## 2022-11-05 MED ORDER — METHYLPREDNISOLONE 4 MG PO TBPK: ORAL_TABLET | ORAL | 0 refills | Status: AC

## 2022-11-05 MED ORDER — INDOMETHACIN 50 MG PO CAPS: 50.0000 mg | ORAL_CAPSULE | Freq: Three times a day (TID) | ORAL | 0 refills | Status: AC

## 2022-11-05 MED ORDER — OXYCODONE HCL 5 MG PO TABS: 5.0000 mg | ORAL_TABLET | ORAL | 0 refills | Status: DC | PRN

## 2022-11-05 NOTE — Patient Instructions (Signed)
[name] - I believe you have gout in your elbow. We will treat this as follows:  -Ice the elbow for 20 minutes 3-4 times daily -We will start him methylprednisolone steroid pack --> take this as prescribed on the box, but you will take 6 tablets the first day, 5 tablets the 2nd day...and so on until completion  - You will also take Indomethacin '50mg'$  (1 tablet) --> take this 3 times a day for one week. For the 2nd week, you will take one tablet only 2 times a day. You will stop this after 2 weeks, only take as needed. Do not take Meloxicam with this.  - You will f/u with me in about 10 days for re-check of the elbow   Dr. Rolena Infante

## 2022-11-05 NOTE — Progress Notes (Addendum)
Marie Torres - 62 y.o. female MRN [id number]  Date of birth: Jul 18, 1961  Office Visit Note: Visit Date: 11/05/2022 PCP: Marie Lima, MD Referred by: Marie Lima, MD  Subjective: Chief Complaint  Patient presents with   Right Elbow - Pain, Edema    Woke up with it either Friday or Saturday, thinks there is fluid in it.    HPI: Marie Torres is a pleasant 62 y.o. female who presents today for evaluation of swollen and painful right elbow.   Was seen by internal medicine physician, Marie Torres yesterday.  Thought to possibly have gout of the elbow.  I did review her lab work which did not show a leukocytosis.  Her uric acid was elevated at 7.1.  C-reactive protein was 3.6.  [name] tells me today that she woke up over the weekend with a painful and swollen elbow.  The pain has not improved.  She does have a history of gout and has affected her big toe and ankle, unsure she has had a flare in the elbow.  Denies any fever or chills, does notice swelling and warmth of the ankle.  No known injury.  Pertinent ROS were reviewed with the patient and found to be negative unless otherwise specified above in HPI.   Assessment & Plan: Visit Diagnoses:  1. Acute gout of right elbow, unspecified cause   2. Effusion of right elbow    Plan: We discussed the likely etiology of her elbow pain and effusion, this is most likely indicative of gout based on her story, prior history of gout and her ultrasound examination today.  I did review her imaging and lab work with her today.  Discussed all possible treatment options such as conservative care, or pharmacologic therapy as well as aspiration and injection.  Given her pain she would like to hold off on aspiration at his point, which I feel is reasonable.  We will treat her with oral medication.  We will treat her with a 4 mg Medrol Dosepak until completion as well as indomethacin 50 mg, she will take this 3 times a day with food for 1 week, then  transition to 2 times daily for the second week.  I would like to see her back in about 10 days for reevaluation of the elbow.  If her pain and swelling is not improved with oral therapy, we could always consider aspiration and/or injection of the elbow.  Recommended ice for 20 minutes multiple times a day.  I did provide her handout and discussed foods to avoid and foods that would help prevent gout flares.  She will call sooner if any issues arise. Noted to discontinue meloxicam while taking Indomethacin.  Follow-up: Return in about 10 days (around 11/15/2022) for F/u with Dr. Rolena Torres later next week for f/u of elbow gout.   Meds & Orders:  Meds ordered this encounter  Medications   indomethacin (INDOCIN) 50 MG capsule    Sig: Take 1 capsule (50 mg total) by mouth 3 (three) times daily with meals for 14 days.    Dispense:  42 capsule    Refill:  0    Orders Placed This Encounter  Procedures   [place] Extrem Up Right Ltd     Procedures: No procedures performed      Clinical History: No specialty comments available.  She reports that she has quit smoking. She has never used smokeless tobacco.  Recent Labs    11/04/22 [place]  7.1*    Objective:    Physical Exam  Gen: Well-appearing, in no acute distress; non-toxic CV: Well-perfused. Warm.  Resp: Breathing unlabored on room air; no wheezing. Psych: Fluid speech in conversation; appropriate affect; normal thought process Neuro: Sensation intact throughout. No gross coordination deficits.   Ortho Exam - Right elbow: Evaluation of the right elbow demonstrates a soft tissue swelling surrounding the elbow joint.  There is tenderness to palpation throughout the elbow joint.  There is no notable bruising or erythema, although there is significant warmth noted to the elbow.  Active and passive range of motion is intact, although slightly painful in all directions secondary to pain and swelling.  Neurovascular intact  distally.  Imaging: [place] Extrem Up Right Ltd  Result Date: 11/05/2022 Limited musculoskeletal ultrasound of the right elbow was performed today.  Ultrasound demonstrates a moderate-large elbow effusion both anteriorly and posteriorly within the joint.  There is overlying soft tissue swelling and notable hyperemia throughout the elbow joint, indicative of inflammatory arthropathy, possibly gout.  There is no cortical defect of the olecranon, proximal insertions of the triceps tendon is noted in long axis posteriorly.  There is a small spur over the lateral aspect of the radial head, no other bony abnormality noted on limited ultrasound scan.   Moderate-large elbow effusion with notable hyperemia and inflammation     *Independent interpretation of right elbow 3 views AP lateral and obliques from 11/04/2022 were independently viewed and interpreted by myself.  X-rays demonstrate a moderate to large joint effusion.  There is no acute fracture, the elbow is well located.  There is spurring off the lateral aspect of the radial head.  Otherwise no abnormal bony abnormality.  [place] Extrem Up Right Ltd Narrative: Limited musculoskeletal ultrasound of the right elbow was performed today.   Ultrasound demonstrates a moderate-large elbow effusion both anteriorly  and posteriorly within the joint.  There is overlying soft tissue swelling  and notable hyperemia throughout the elbow joint, indicative of  inflammatory arthropathy, possibly gout.  There is no cortical defect of  the olecranon, proximal insertions of the triceps tendon is noted in long  axis posteriorly.  There is a small spur over the lateral aspect of the  radial head, no other bony abnormality noted on limited ultrasound scan. Impression: Moderate-large elbow effusion with notable hyperemia and  inflammation    Past Medical/Family/Surgical/Social History: Medications & Allergies reviewed per EMR, new medications updated. Patient Active Problem  List   Diagnosis Date Noted   Acute idiopathic gout of right elbow 11/05/2022   Swelling of right elbow 11/04/2022   Dysuria 11/04/2022   Effusion of right elbow 11/04/2022   Acute pain of left knee 05/21/2022   Injury of medial collateral ligament (MCL) of knee 05/21/2022   Vulvar lesion 01/10/2022   OAB (overactive bladder) 11/22/2021   Stage 3a chronic kidney disease ([place]) 11/02/2020   Hyperplastic colonic polyp 06/29/2020   Cervical cancer screening 06/28/2020   Vitamin D deficiency disease 04/29/2019   Essential hypertension 04/29/2019   Hyperlipidemia with target LDL less than 100 04/29/2019   Bilateral edema of lower extremity 05/27/2017   Routine general medical examination at a health care facility 06/04/2016   Visit for screening mammogram 06/04/2016   Insomnia 08/17/2015   Rectal incontinence 08/17/2015   Pain due to interstitial cystitis 09/16/2014   ESOPHAGITIS, REFLUX 08/14/2007   Past Medical History:  Diagnosis Date   Arthritis    right hand   Chicken pox  Childhood   Diverticulosis of colon (without mention of hemorrhage)    GERD (gastroesophageal reflux disease)    Hiatal hernia    Hyperplastic colon polyp    Mass of finger of right hand 02/2013   right middle finger   Seasonal allergies    Sinus congestion 03/16/2013   cough, stuffy and runny nose   Urine incontinence    Vitamin D deficiency    Wears partial dentures    upper   Family History  Problem Relation Age of Onset   Kidney disease Father    Heart disease Father    Stroke Father    Hypertension Father    Heart disease Mother    Depression Mother    Cancer Sister        lung   Breast cancer Sister        both breast removed   Colon cancer Neg Hx    Esophageal cancer Neg Hx    Past Surgical History:  Procedure Laterality Date   COLONOSCOPY  08/14/2007   562.10, 211.3   FOOT SURGERY Bilateral    exc. ingrown toenail great toe   TUBAL LIGATION  09/02/2002   Social History    Occupational History    Employer: [employer]  Tobacco Use   Smoking status: Former   Smokeless tobacco: Never   Tobacco comments:    quit smoking 20 years ago  Vaping Use   Vaping Use: Never used  Substance and Sexual Activity   Alcohol use: Yes    Alcohol/week: 14.0 standard drinks of alcohol    Types: 14 Glasses of wine per week    Comment: occasionally   Drug use: No   Sexual activity: Yes    Partners: Male    Birth control/protection: Post-menopausal    Comment: 1st intercourse- 83, partners- 4,

## 2022-11-06 DIAGNOSIS — A498 Other bacterial infections of unspecified site: Secondary | ICD-10-CM | POA: Insufficient documentation

## 2022-11-06 LAB — CULTURE, URINE COMPREHENSIVE

## 2022-11-06 MED ORDER — SULFAMETHOXAZOLE-TRIMETHOPRIM 800-160 MG PO TABS: 1.0000 | ORAL_TABLET | Freq: Two times a day (BID) | ORAL | 0 refills | Status: AC

## 2022-11-07 ENCOUNTER — Ambulatory Visit: Payer: BC Managed Care – PPO | Admitting: Sports Medicine

## 2022-11-15 ENCOUNTER — Encounter: Payer: Self-pay | Admitting: Sports Medicine

## 2022-11-15 ENCOUNTER — Ambulatory Visit: Payer: BC Managed Care – PPO | Admitting: Sports Medicine

## 2022-11-15 ENCOUNTER — Ambulatory Visit: Payer: Self-pay

## 2022-11-15 DIAGNOSIS — M25421 Effusion, right elbow: Secondary | ICD-10-CM

## 2022-11-15 DIAGNOSIS — M109 Gout, unspecified: Secondary | ICD-10-CM | POA: Diagnosis not present

## 2022-11-15 NOTE — Progress Notes (Signed)
Marie Torres - 62 y.o. female MRN [id number]  Date of birth: November 22, 1960  Office Visit Note: Visit Date: 11/15/2022 PCP: Janith Lima, MD Referred by: Janith Lima, MD  Subjective: Chief Complaint  Patient presents with   Right Elbow - Follow-up   HPI: Marie Torres is a pleasant 62 y.o. female who presents today for follow-up of right elbow pain with gout.  I last saw her on 11/05/2022 and did diagnose her with gout of the right elbow.  She has completed her 4 mg Medrol Dosepak x 6 days.  She did pick up her indomethacin prescription, although is holding on this as she finishes treatment for a urinary tract infection.  Reports her pain and swelling in the elbow is markedly improved, although still some mild residual pain.  Vast improvement in range of motion.  As previously mentioned, she does have a history of gout that has affected her great toe as well as her ankle in the past.  Is not on preventative medication.  Pertinent ROS were reviewed with the patient and found to be negative unless otherwise specified above in HPI.   Assessment & Plan: Visit Diagnoses:  1. Acute gout of right elbow, unspecified cause   2. Effusion of right elbow    Plan: Discussed with [name] today that she has both clinical as well as ultrasound findings of a resolving gout infection.  She has completed the prednisone taper, I would like her to begin indomethacin 50 mg 2-3x daily for the next 2 weeks.  She will then follow-up with me in about 3 weeks and we will discuss initiating allopurinol treatment for prevention.  She will call or return sooner if any issues arise. Goal for uric acid < 6 mg/dL.  Follow-up: Return in about 3 weeks (around 12/06/2022).   Meds & Orders: No orders of the defined types were placed in this encounter.   Orders Placed This Encounter  Procedures   [place] Extrem Up Right Ltd     Procedures: No procedures performed      Clinical History: No specialty comments  available.  She reports that she has quit smoking. She has never used smokeless tobacco.  Recent Labs    11/04/22 1404  [place]UR[place] 7.1*    Objective:    Physical Exam  Gen: Well-appearing, in no acute distress; non-toxic CV: Well-perfused. Warm.  Resp: Breathing unlabored on room air; no wheezing. Psych: Fluid speech in conversation; appropriate affect; normal thought process Neuro: Sensation intact throughout. No gross coordination deficits.   Ortho Exam - Right elbow: Inspection of the right elbow demonstrates a mild residual elbow effusion, some TTP over the medial aspect of the elbow joint.  No redness or erythema.  There is full and active range of motion in flexion and extension.  Neurovascular intact distally.  Imaging: [place] Extrem Up Right Ltd  Result Date: 11/15/2022 Limited ultrasound of the right elbow was performed today.  Overlying triceps was noted with proper insertion on the olecranon without evidence of tearing.  There is a small joint effusion today, markedly improved from last visit.  There is some cobblestoning noted of the overlying soft tissue.  No bony irregularity of the olecranon or medial epicondyle.  Mild hyperemia noted within the joint itself.   Resolving gout and markedly improved elbow effusion                   [place] image today (11/15/22)  [place] image from 11/05/22 (large eff)  Past Medical/Family/Surgical/Social History: Medications & Allergies reviewed per EMR, new medications updated. Patient Active Problem List   Diagnosis Date Noted   Citrobacter infection 11/06/2022   Acute idiopathic gout of right elbow 11/05/2022   Swelling of right elbow 11/04/2022   Dysuria 11/04/2022   Effusion of right elbow 11/04/2022   Acute pain of left knee 05/21/2022   Injury of medial collateral ligament (MCL) of knee 05/21/2022   Acute cystitis without hematuria 04/23/2022   Vulvar lesion 01/10/2022   OAB (overactive bladder)  11/22/2021   Stage 3a chronic kidney disease ([place]) 11/02/2020   Hyperplastic colonic polyp 06/29/2020   Cervical cancer screening 06/28/2020   Vitamin D deficiency disease 04/29/2019   Essential hypertension 04/29/2019   Hyperlipidemia with target LDL less than 100 04/29/2019   Bilateral edema of lower extremity 05/27/2017   Routine general medical examination at a health care facility 06/04/2016   Visit for screening mammogram 06/04/2016   Insomnia 08/17/2015   Rectal incontinence 08/17/2015   Pain due to interstitial cystitis 09/16/2014   ESOPHAGITIS, REFLUX 08/14/2007   Past Medical History:  Diagnosis Date   Arthritis    right hand   Chicken pox    Childhood   Diverticulosis of colon (without mention of hemorrhage)    GERD (gastroesophageal reflux disease)    Hiatal hernia    Hyperplastic colon polyp    Mass of finger of right hand 02/2013   right middle finger   Seasonal allergies    Sinus congestion 03/16/2013   cough, stuffy and runny nose   Urine incontinence    Vitamin D deficiency    Wears partial dentures    upper   Family History  Problem Relation Age of Onset   Kidney disease Father    Heart disease Father    Stroke Father    Hypertension Father    Heart disease Mother    Depression Mother    Cancer Sister        lung   Breast cancer Sister        both breast removed   Colon cancer Neg Hx    Esophageal cancer Neg Hx    Past Surgical History:  Procedure Laterality Date   COLONOSCOPY  08/14/2007   562.10, 211.3   FOOT SURGERY Bilateral    exc. ingrown toenail great toe   TUBAL LIGATION  09/02/2002   Social History   Occupational History    Employer: [employer]  Tobacco Use   Smoking status: Former   Smokeless tobacco: Never   Tobacco comments:    quit smoking 20 years ago  Vaping Use   Vaping Use: Never used  Substance and Sexual Activity   Alcohol use: Yes    Alcohol/week: 14.0 standard drinks of alcohol    Types: 14 Glasses of wine per  week    Comment: occasionally   Drug use: No   Sexual activity: Yes    Partners: Male    Birth control/protection: Post-menopausal    Comment: 1st intercourse- 68, partners- 4,

## 2022-11-15 NOTE — Progress Notes (Signed)
States elbow is better; swelling has gone down

## 2022-11-23 ENCOUNTER — Ambulatory Visit (HOSPITAL_COMMUNITY)
Admission: EM | Admit: 2022-11-23 | Discharge: 2022-11-23 | Disposition: A | Discharge status: Home / Self Care | Payer: BC Managed Care – PPO

## 2022-11-28 ENCOUNTER — Ambulatory Visit: Payer: BC Managed Care – PPO | Admitting: Podiatry

## 2022-11-28 DIAGNOSIS — M10072 Idiopathic gout, left ankle and foot: Secondary | ICD-10-CM | POA: Diagnosis not present

## 2022-11-28 DIAGNOSIS — M7752 Other enthesopathy of left foot: Secondary | ICD-10-CM | POA: Diagnosis not present

## 2022-11-28 NOTE — Progress Notes (Signed)
Subjective:  Patient ID: Marie Torres, female    DOB: 04/16/1961,  MRN: [id number]  Chief Complaint  Patient presents with   Foot Pain    62 y.o. female presents with the above complaint.  Patient presents with complaint left ankle pain with red hot swollen joint.  Patient states that it started out of nowhere and started has been hurting her a lot.  She denies any other acute complaints.  She states that she does have a history of gout and she has kind of stepped away little bit from diet.  She wanted to discuss treatment options including injection.  Injection in the past has helped with the big toe joint.  She denies any other acute complaints.   Review of Systems: Negative except as noted in the HPI. Denies N/V/F/Ch.  Past Medical History:  Diagnosis Date   Arthritis    right hand   Chicken pox    Childhood   Diverticulosis of colon (without mention of hemorrhage)    GERD (gastroesophageal reflux disease)    Hiatal hernia    Hyperplastic colon polyp    Mass of finger of right hand 02/2013   right middle finger   Seasonal allergies    Sinus congestion 03/16/2013   cough, stuffy and runny nose   Urine incontinence    Vitamin D deficiency    Wears partial dentures    upper    Current Outpatient Medications:    allopurinol (ZYLOPRIM) 100 MG tablet, Take 1 tablet (100 mg total) by mouth daily., Disp: 90 tablet, Rfl: 1   Cholecalciferol 1.25 MG (50000 UT) capsule, TAKE 1 CAPSULE BY MOUTH ONE TIME PER WEEK, Disp: , Rfl:    colchicine 0.6 MG tablet, Take 1 tablet (0.6 mg total) by mouth 2 (two) times daily., Disp: 60 tablet, Rfl: 0   diazepam (VALIUM) 5 MG tablet, 1 po 30 mins prior to MRI, Disp: , Rfl:    fluticasone (FLONASE) 50 MCG/ACT nasal spray, Place 1 spray into both nostrils daily., Disp: 16 g, Rfl: 1   levocetirizine (XYZAL) 5 MG tablet, Take 1 tablet (5 mg total) by mouth every evening., Disp: 30 tablet, Rfl: 2   lidocaine (XYLOCAINE) 5 % ointment, Apply 1 application  topically as needed., Disp: 35.44 g, Rfl: 0   methocarbamol (ROBAXIN) 500 MG tablet, TAKE 1 TABLET BY MOUTH TWICE A DAY AS NEEDED FOR SPASMS, Disp: , Rfl:    oxyCODONE (OXY IR/ROXICODONE) 5 MG immediate release tablet, Take 1 tablet (5 mg total) by mouth every 4 (four) hours as needed for severe pain., Disp: 30 tablet, Rfl: 0   rosuvastatin (CRESTOR) 5 MG tablet, TAKE 1 TABLET (5 MG TOTAL) BY MOUTH DAILY., Disp: 90 tablet, Rfl: 1   solifenacin (VESICARE) 10 MG tablet, Take 1 tablet (10 mg total) by mouth daily., Disp: 90 tablet, Rfl: 0  Social History   Tobacco Use  Smoking Status Former  Smokeless Tobacco Never  Tobacco Comments   quit smoking 20 years ago    Allergies  Allergen Reactions   Shellfish Allergy Hives   Objective:  There were no vitals filed for this visit. There is no height or weight on file to calculate BMI. Constitutional Well developed. Well nourished.  Vascular Dorsalis pedis pulses palpable bilaterally. Posterior tibial pulses palpable bilaterally. Capillary refill normal to all digits.  No cyanosis or clubbing noted. Pedal hair growth normal.  Neurologic Normal speech. Oriented to person, place, and time. Epicritic sensation to light touch grossly present bilaterally.  Dermatologic Nails well groomed and normal in appearance. No open wounds. No skin lesions.  Orthopedic: Pain with range of motion of the ankle joint pain with palpation of the ankle joint.  Red hot swollen joint noted.  No pain at the Achilles tendon peroneal tendon posterior tibial tendon ATFL ligament.   Radiographs: None Assessment:   1. Capsulitis of left ankle   2. Acute idiopathic gout of left ankle    Plan:  Patient was evaluated and treated and all questions answered.  Left ankle capsulitis with underlying gout flare -All questions and concerns were discussed with the patient in extensive detail -Given the amount of pain that she is experiencing she will benefit from a  steroid injection to help decrease acute inflammatory component associate with pain.  Patient agrees with plan like to proceed with steroid injection -A steroid injection was performed at left ankle joint using 1% plain Lidocaine and 10 mg of Kenalog. This was well tolerated. -I discussed shoe gear modification as well as diet management for gout.   No follow-ups on file.

## 2022-12-06 ENCOUNTER — Ambulatory Visit: Payer: BC Managed Care – PPO | Admitting: Sports Medicine

## 2022-12-06 ENCOUNTER — Encounter: Payer: Self-pay | Admitting: Sports Medicine

## 2022-12-06 DIAGNOSIS — M1A09X0 Idiopathic chronic gout, multiple sites, without tophus (tophi): Secondary | ICD-10-CM | POA: Diagnosis not present

## 2022-12-06 DIAGNOSIS — R600 Localized edema: Secondary | ICD-10-CM | POA: Diagnosis not present

## 2022-12-06 DIAGNOSIS — M109 Gout, unspecified: Secondary | ICD-10-CM

## 2022-12-06 MED ORDER — ALLOPURINOL 100 MG PO TABS: 100.0000 mg | ORAL_TABLET | Freq: Every day | ORAL | 1 refills | Status: DC

## 2022-12-06 NOTE — Progress Notes (Signed)
States her elbow is doing better; since last seeing [place] she had a flare up in her foot which was injected

## 2022-12-06 NOTE — Progress Notes (Signed)
Marie Torres - 62 y.o. female MRN [id number]  Date of birth: 18-Jun-1961  Office Visit Note: Visit Date: 12/06/2022 PCP: Janith Lima, MD Referred by: Janith Lima, MD  Subjective: Chief Complaint  Patient presents with   Right Elbow - Follow-up   HPI: Marie Torres is a pleasant 62 y.o. female who presents today for follow-up of gout of multiple sites, follow-up acute gout of right elbow.   Marie Torres states that her right elbow has essentially resolved.  She no longer has any swelling or pain in it.  She has full range of motion.  She did pick up the indomethacin prescription, did complete it for 2 weeks, she has now finished this.  Here just recently on 11/28/2022 she did have an outbreak of gout of the left ankle, she did see podiatry and per their note and the patient had an injection in this area which calm things down.   She has also noted swelling of bilateral lower extremities.  She has had this in the past and had compression stockings, but does not like wearing those.  Not wearing them currently.    Last Uric acid was 7.1 on 11/04/22 per chart review.  Pertinent ROS were reviewed with the patient and found to be negative unless otherwise specified above in HPI.   Assessment & Plan: Visit Diagnoses:  1. Idiopathic chronic gout of multiple sites without tophus   2. Acute gout of right elbow, unspecified cause   3. Acute gout of left ankle, unspecified cause   4. Bilateral edema of lower extremity    Plan: Discussed with Marie Torres that I am glad that her elbow effusion and gout flare has resolved, it does sound like she had a flare of the ankle and is still dealing with the sequelae of this.  She has completed the acute treatment, at this time we discussed needing to treat her prophylactically to prevent these recurrent flares.  Will begin allopurinol 100 mg once daily.  She will follow-up in 4 weeks to draw uric acid levels in the blood.  Goal of uric acid less than 6.  I did print  out and reviewed recommended foods and foods to avoid for gout with education on low purine diet.  She does have some pitting edema bilateral legs, recommend she could go back into her compression stockings.  I would like her however to have this evaluated by her primary care physician to rule out cardiac, valve insufficiency or other causes.  Follow-up: Return in about 4 weeks (around 01/03/2023) for fot gout f/u (draw uric acid levels).   Meds & Orders:  Meds ordered this encounter  Medications   allopurinol (ZYLOPRIM) 100 MG tablet    Sig: Take 1 tablet (100 mg total) by mouth daily.    Dispense:  90 tablet    Refill:  1   No orders of the defined types were placed in this encounter.    Procedures: No procedures performed      Clinical History: No specialty comments available.  She reports that she has quit smoking. She has never used smokeless tobacco.  Recent Labs    11/04/22 1404  LABURIC 7.1*    Objective:    Physical Exam  Gen: Well-appearing, in no acute distress; non-toxic CV:  Well-perfused. Warm.  Resp: Breathing unlabored on room air; no wheezing. Psych: Fluid speech in conversation; appropriate affect; normal thought process Neuro: Sensation intact throughout. No gross coordination deficits.   Ortho Exam -  Right elbow: Full range of motion about the elbow in all directions in flexion and extension.  No tenderness to palpation.  No swelling or redness.  No varus or valgus instability.  - Left ankle: There is some residual soft tissue swelling around the ankl likely emanating likely small joint effusion, very mild warmth.  No redness.  2+ pitting edema up to the mid shin of the left leg, 1+ of the right leg  Imaging: No results found.  Past Medical/Family/Surgical/Social History: Medications & Allergies reviewed per EMR, new medications updated. Patient Active Problem List   Diagnosis Date Noted   Citrobacter infection 11/06/2022   Idiopathic gout of  multiple sites 11/05/2022   Swelling of right elbow 11/04/2022   Dysuria 11/04/2022   Effusion of right elbow 11/04/2022   Acute pain of left knee 05/21/2022   Injury of medial collateral ligament (MCL) of knee 05/21/2022   Acute cystitis without hematuria 04/23/2022   Vulvar lesion 01/10/2022   OAB (overactive bladder) 11/22/2021   Stage 3a chronic kidney disease ([place]) 11/02/2020   Hyperplastic colonic polyp 06/29/2020   Cervical cancer screening 06/28/2020   Vitamin D deficiency disease 04/29/2019   Essential hypertension 04/29/2019   Hyperlipidemia with target LDL less than 100 04/29/2019   Bilateral edema of lower extremity 05/27/2017   Routine general medical examination at a health care facility 06/04/2016   Visit for screening mammogram 06/04/2016   Insomnia 08/17/2015   Rectal incontinence 08/17/2015   Pain due to interstitial cystitis 09/16/2014   ESOPHAGITIS, REFLUX 08/14/2007   Past Medical History:  Diagnosis Date   Arthritis    right hand   Chicken pox    Childhood   Diverticulosis of colon (without mention of hemorrhage)    GERD (gastroesophageal reflux disease)    Hiatal hernia    Hyperplastic colon polyp    Mass of finger of right hand 02/2013   right middle finger   Seasonal allergies    Sinus congestion 03/16/2013   cough, stuffy and runny nose   Urine incontinence    Vitamin D deficiency    Wears partial dentures    upper   Family History  Problem Relation Age of Onset   Kidney disease Father    Heart disease Father    Stroke Father    Hypertension Father    Heart disease Mother    Depression Mother    Cancer Sister        lung   Breast cancer Sister        both breast removed   Colon cancer Neg Hx    Esophageal cancer Neg Hx    Past Surgical History:  Procedure Laterality Date   COLONOSCOPY  08/14/2007   562.10, 211.3   FOOT SURGERY Bilateral    exc. ingrown toenail great toe   TUBAL LIGATION  09/02/2002   Social History    Occupational History    Employer: [employer]  Tobacco Use   Smoking status: Former   Smokeless tobacco: Never   Tobacco comments:    quit smoking 20 years ago  Vaping Use   Vaping Use: Never used  Substance and Sexual Activity   Alcohol use: Yes    Alcohol/week: 14.0 standard drinks of alcohol    Types: 14 Glasses of wine per week    Comment: occasionally   Drug use: No   Sexual activity: Yes    Partners: Male    Birth control/protection: Post-menopausal    Comment: 1st intercourse- 59, partners- 4,

## 2022-12-10 ENCOUNTER — Encounter: Payer: Self-pay | Admitting: Internal Medicine

## 2022-12-10 ENCOUNTER — Ambulatory Visit: Payer: BC Managed Care – PPO | Admitting: Internal Medicine

## 2022-12-10 VITALS — BP 114/68 | HR 77 | Temp 97.7°F | Resp 16 | Ht 61.0 in | Wt 163.0 lb

## 2022-12-10 DIAGNOSIS — N3281 Overactive bladder: Secondary | ICD-10-CM | POA: Diagnosis not present

## 2022-12-10 DIAGNOSIS — A498 Other bacterial infections of unspecified site: Secondary | ICD-10-CM

## 2022-12-10 DIAGNOSIS — E785 Hyperlipidemia, unspecified: Secondary | ICD-10-CM | POA: Diagnosis not present

## 2022-12-10 DIAGNOSIS — N1831 Chronic kidney disease, stage 3a: Secondary | ICD-10-CM | POA: Diagnosis not present

## 2022-12-10 DIAGNOSIS — Z1211 Encounter for screening for malignant neoplasm of colon: Secondary | ICD-10-CM

## 2022-12-10 DIAGNOSIS — M1A09X0 Idiopathic chronic gout, multiple sites, without tophus (tophi): Secondary | ICD-10-CM

## 2022-12-10 DIAGNOSIS — I10 Essential (primary) hypertension: Secondary | ICD-10-CM

## 2022-12-10 LAB — LIPID PANEL
Cholesterol: 200 mg/dL (ref 0–200)
HDL: 70.7 mg/dL (ref >39.00)
LDL Cholesterol: 104 mg/dL — ABNORMAL HIGH (ref 0–99)
NonHDL: 129.65
Total CHOL/HDL Ratio: 3
Triglycerides: 130 mg/dL (ref 0.0–149.0)
VLDL: 26 mg/dL (ref 0.0–40.0)

## 2022-12-10 LAB — URINALYSIS, ROUTINE W REFLEX MICROSCOPIC
Bilirubin Urine: NEGATIVE
Hgb urine dipstick: NEGATIVE
Ketones, ur: NEGATIVE
Leukocytes,Ua: NEGATIVE
Nitrite: NEGATIVE
Specific Gravity, Urine: 1.01 (ref 1.000–1.030)
Total Protein, Urine: NEGATIVE
Urine Glucose: NEGATIVE
Urobilinogen, UA: 0.2 (ref 0.0–1.0)
pH: 7 (ref 5.0–8.0)

## 2022-12-10 LAB — BASIC METABOLIC PANEL
BUN: 13 mg/dL (ref 6–23)
CO2: 30 mEq/L (ref 19–32)
Calcium: 9.7 mg/dL (ref 8.4–10.5)
Chloride: 101 mEq/L (ref 96–112)
Creatinine, Ser: 0.91 mg/dL (ref 0.40–1.20)
GFR: 67.09 mL/min (ref >60.00)
Glucose, Bld: 79 mg/dL (ref 70–99)
Potassium: 4 mEq/L (ref 3.5–5.1)
Sodium: 137 mEq/L (ref 135–145)

## 2022-12-10 MED ORDER — SOLIFENACIN SUCCINATE 10 MG PO TABS: 10.0000 mg | ORAL_TABLET | Freq: Every day | ORAL | 0 refills | Status: DC

## 2022-12-10 NOTE — Progress Notes (Signed)
Subjective:  Patient ID: Marie Torres, female    DOB: 10-Apr-1961  Age: 62 y.o. MRN: [id number]  CC: Hypertension   HPI Marie Torres presents for a f/up -----  She complains of a several week history of urinary urgency and frequency.  She has a history of overactive bladder but was not aware of this and is not taking anything for OAB.  Outpatient Medications Prior to Visit  Medication Sig Dispense Refill   allopurinol (ZYLOPRIM) 100 MG tablet Take 1 tablet (100 mg total) by mouth daily. 90 tablet 1   Cholecalciferol 1.25 MG (50000 UT) capsule TAKE 1 CAPSULE BY MOUTH ONE TIME PER WEEK     colchicine 0.6 MG tablet Take 1 tablet (0.6 mg total) by mouth 2 (two) times daily. 60 tablet 0   diazepam (VALIUM) 5 MG tablet 1 po 30 mins prior to MRI     fluticasone (FLONASE) 50 MCG/ACT nasal spray Place 1 spray into both nostrils daily. 16 g 1   levocetirizine (XYZAL) 5 MG tablet Take 1 tablet (5 mg total) by mouth every evening. 30 tablet 2   lidocaine (XYLOCAINE) 5 % ointment Apply 1 application topically as needed. 35.44 g 0   methocarbamol (ROBAXIN) 500 MG tablet TAKE 1 TABLET BY MOUTH TWICE A DAY AS NEEDED FOR SPASMS     oxyCODONE (OXY IR/ROXICODONE) 5 MG immediate release tablet Take 1 tablet (5 mg total) by mouth every 4 (four) hours as needed for severe pain. 30 tablet 0   rosuvastatin (CRESTOR) 5 MG tablet TAKE 1 TABLET (5 MG TOTAL) BY MOUTH DAILY. 90 tablet 1   benzonatate (TESSALON) 100 MG capsule Take 1 capsule (100 mg total) by mouth every 8 (eight) hours. 21 capsule 0   phenazopyridine (PYRIDIUM) 100 MG tablet Take 1 tablet (100 mg total) by mouth 3 (three) times daily as needed for pain. 15 tablet 0   triamterene-hydrochlorothiazide (DYAZIDE) 37.5-25 MG capsule TAKE 1 CAPSULE BY MOUTH EVERY DAY 90 capsule 0   No facility-administered medications prior to visit.    ROS Review of Systems  Constitutional:  Negative for chills, fatigue and fever.  HENT: Negative.    Eyes:  Negative.   Respiratory:  Negative for cough, chest tightness, shortness of breath and wheezing.   Cardiovascular:  Negative for chest pain, palpitations and leg swelling.  Gastrointestinal:  Negative for abdominal pain, constipation, diarrhea, nausea and vomiting.  Endocrine: Negative.   Genitourinary:  Positive for frequency and urgency. Negative for decreased urine volume, difficulty urinating, dysuria and hematuria.  Musculoskeletal: Negative.  Negative for arthralgias and myalgias.  Skin: Negative.   Neurological: Negative.  Negative for dizziness and weakness.  Hematological:  Negative for adenopathy. Does not bruise/bleed easily.  Psychiatric/Behavioral: Negative.      Objective:  BP 114/68 (BP Location: Left Arm, Patient Position: Sitting, Cuff Size: Large)   Pulse 77   Temp 97.7 F (36.5 C) (Oral)   Resp 16   Ht 5' 1"$  (1.549 m)   Wt 163 lb (73.9 kg)   SpO2 96%   BMI 30.80 kg/m   BP Readings from Last 3 Encounters:  12/10/22 114/68  11/04/22 124/76  08/26/22 (!) 172/73    Wt Readings from Last 3 Encounters:  12/10/22 163 lb (73.9 kg)  11/04/22 158 lb (71.7 kg)  06/24/22 161 lb (73 kg)    Physical Exam Vitals reviewed.  Constitutional:      Appearance: She is not ill-appearing.  HENT:     Mouth/Throat:  Mouth: Mucous membranes are moist.  Eyes:     General: No scleral icterus.    Conjunctiva/sclera: Conjunctivae normal.  Cardiovascular:     Rate and Rhythm: Normal rate and regular rhythm.     Heart sounds: No murmur heard. Pulmonary:     Effort: Pulmonary effort is normal.     Breath sounds: No stridor. No wheezing, rhonchi or rales.  Abdominal:     General: Abdomen is flat.     Palpations: There is no mass.     Tenderness: There is no abdominal tenderness. There is no guarding.     Hernia: No hernia is present.  Musculoskeletal:        General: Normal range of motion.     Cervical back: Neck supple.     Right lower leg: No edema.     Left lower  leg: No edema.  Lymphadenopathy:     Cervical: No cervical adenopathy.  Skin:    General: Skin is warm and dry.  Neurological:     General: No focal deficit present.     Mental Status: She is alert.  Psychiatric:        Mood and Affect: Mood normal.        Behavior: Behavior normal.     Lab Results  Component Value Date   WBC 9.5 11/04/2022   HGB 13.6 11/04/2022   HCT 41.0 11/04/2022   PLT 317.0 11/04/2022   GLUCOSE 79 12/10/2022   CHOL 200 12/10/2022   TRIG 130.0 12/10/2022   HDL 70.70 12/10/2022   LDLDIRECT 150.8 11/30/2013   LDLCALC 104 (H) 12/10/2022   ALT 13 01/22/2022   AST 16 01/22/2022   NA 137 12/10/2022   K 4.0 12/10/2022   CL 101 12/10/2022   CREATININE 0.91 12/10/2022   BUN 13 12/10/2022   CO2 30 12/10/2022   TSH 0.97 11/21/2021   HGBA1C 5.7 04/29/2019    No results found.  Assessment & Plan:   Marie Torres was seen today for hypertension.  Diagnoses and all orders for this visit:  Essential hypertension - Her blood pressure is overcontrolled.  Will discontinue the diuretic. -     Basic metabolic panel; Future -     Basic metabolic panel  Stage 3a chronic kidney disease ([place])- Her renal function is stable. -     Basic metabolic panel; Future -     Basic metabolic panel  OAB (overactive bladder) -     Urinalysis, Routine w reflex microscopic; Future -     CULTURE, URINE COMPREHENSIVE; Future -     CULTURE, URINE COMPREHENSIVE -     Urinalysis, Routine w reflex microscopic -     solifenacin (VESICARE) 10 MG tablet; Take 1 tablet (10 mg total) by mouth daily.  Hyperlipidemia with target LDL less than 100 - LDL goal achieved. Doing well on the statin  -     Lipid panel; Future -     Lipid panel  Citrobacter infection- Repeat culture is negative. -     Urinalysis, Routine w reflex microscopic; Future -     CULTURE, URINE COMPREHENSIVE; Future -     CULTURE, URINE COMPREHENSIVE -     Urinalysis, Routine w reflex microscopic  Screen for colon  cancer -     Ambulatory referral to Gastroenterology   I have discontinued Marie Torres "Marie Torres"'s phenazopyridine, benzonatate, and triamterene-hydrochlorothiazide. I am also having her start on solifenacin. Additionally, I am having her maintain her lidocaine, methocarbamol, diazepam, Cholecalciferol, levocetirizine, fluticasone,  rosuvastatin, colchicine, oxyCODONE, and allopurinol.  Meds ordered this encounter  Medications   solifenacin (VESICARE) 10 MG tablet    Sig: Take 1 tablet (10 mg total) by mouth daily.    Dispense:  90 tablet    Refill:  0     Follow-up: Return in about 3 months (around 03/10/2023).  Scarlette Calico, MD

## 2022-12-10 NOTE — Patient Instructions (Signed)
Hypertension, Adult High blood pressure (hypertension) is when the force of blood pumping through the arteries is too strong. The arteries are the blood vessels that carry blood from the heart throughout the body. Hypertension forces the heart to work harder to pump blood and may cause arteries to become narrow or stiff. Untreated or uncontrolled hypertension can lead to a heart attack, heart failure, a stroke, kidney disease, and other problems. A blood pressure reading consists of a higher number over a lower number. Ideally, your blood pressure should be below 120/80. The first ("top") number is called the systolic pressure. It is a measure of the pressure in your arteries as your heart beats. The second ("bottom") number is called the diastolic pressure. It is a measure of the pressure in your arteries as the heart relaxes. What are the causes? The exact cause of this condition is not known. There are some conditions that result in high blood pressure. What increases the risk? Certain factors may make you more likely to develop high blood pressure. Some of these risk factors are under your control, including: Smoking. Not getting enough exercise or physical activity. Being overweight. Having too much fat, sugar, calories, or salt (sodium) in your diet. Drinking too much alcohol. Other risk factors include: Having a personal history of heart disease, diabetes, high cholesterol, or kidney disease. Stress. Having a family history of high blood pressure and high cholesterol. Having obstructive sleep apnea. Age. The risk increases with age. What are the signs or symptoms? High blood pressure may not cause symptoms. Very high blood pressure (hypertensive crisis) may cause: Headache. Fast or irregular heartbeats (palpitations). Shortness of breath. Nosebleed. Nausea and vomiting. Vision changes. Severe chest pain, dizziness, and seizures. How is this diagnosed? This condition is diagnosed by  measuring your blood pressure while you are seated, with your arm resting on a flat surface, your legs uncrossed, and your feet flat on the floor. The cuff of the blood pressure monitor will be placed directly against the skin of your upper arm at the level of your heart. Blood pressure should be measured at least twice using the same arm. Certain conditions can cause a difference in blood pressure between your right and left arms. If you have a high blood pressure reading during one visit or you have normal blood pressure with other risk factors, you may be asked to: Return on a different day to have your blood pressure checked again. Monitor your blood pressure at home for 1 week or longer. If you are diagnosed with hypertension, you may have other blood or imaging tests to help your health care provider understand your overall risk for other conditions. How is this treated? This condition is treated by making healthy lifestyle changes, such as eating healthy foods, exercising more, and reducing your alcohol intake. You may be referred for counseling on a healthy diet and physical activity. Your health care provider may prescribe medicine if lifestyle changes are not enough to get your blood pressure under control and if: Your systolic blood pressure is above 130. Your diastolic blood pressure is above 80. Your personal target blood pressure may vary depending on your medical conditions, your age, and other factors. Follow these instructions at home: Eating and drinking  Eat a diet that is high in fiber and potassium, and low in sodium, added sugar, and fat. An example of this eating plan is called the DASH diet. DASH stands for Dietary Approaches to Stop Hypertension. To eat this way: Eat   plenty of fresh fruits and vegetables. Try to fill one half of your plate at each meal with fruits and vegetables. Eat whole grains, such as whole-wheat pasta, brown rice, or whole-grain bread. Fill about one  fourth of your plate with whole grains. Eat or drink low-fat dairy products, such as skim milk or low-fat yogurt. Avoid fatty cuts of meat, processed or cured meats, and poultry with skin. Fill about one fourth of your plate with lean proteins, such as fish, chicken without skin, beans, eggs, or tofu. Avoid pre-made and processed foods. These tend to be higher in sodium, added sugar, and fat. Reduce your daily sodium intake. Many people with hypertension should eat less than 1,500 mg of sodium a day. Do not drink alcohol if: Your health care provider tells you not to drink. You are pregnant, may be pregnant, or are planning to become pregnant. If you drink alcohol: Limit how much you have to: 0-1 drink a day for women. 0-2 drinks a day for men. Know how much alcohol is in your drink. In the [place] one drink equals one 12 oz bottle of beer (355 mL), one 5 oz glass of wine (148 mL), or one 1 oz glass of hard liquor (44 mL). Lifestyle  Work with your health care provider to maintain a healthy body weight or to lose weight. Ask what an ideal weight is for you. Get at least 30 minutes of exercise that causes your heart to beat faster (aerobic exercise) most days of the week. Activities may include walking, swimming, or biking. Include exercise to strengthen your muscles (resistance exercise), such as Pilates or lifting weights, as part of your weekly exercise routine. Try to do these types of exercises for 30 minutes at least 3 days a week. Do not use any products that contain nicotine or tobacco. These products include cigarettes, chewing tobacco, and vaping devices, such as e-cigarettes. If you need help quitting, ask your health care provider. Monitor your blood pressure at home as told by your health care provider. Keep all follow-up visits. This is important. Medicines Take over-the-counter and prescription medicines only as told by your health care provider. Follow directions carefully. Blood  pressure medicines must be taken as prescribed. Do not skip doses of blood pressure medicine. Doing this puts you at risk for problems and can make the medicine less effective. Ask your health care provider about side effects or reactions to medicines that you should watch for. Contact a health care provider if you: Think you are having a reaction to a medicine you are taking. Have headaches that keep coming back (recurring). Feel dizzy. Have swelling in your ankles. Have trouble with your vision. Get help right away if you: Develop a severe headache or confusion. Have unusual weakness or numbness. Feel faint. Have severe pain in your chest or abdomen. Vomit repeatedly. Have trouble breathing. These symptoms may be an emergency. Get help right away. Call 911. Do not wait to see if the symptoms will go away. Do not drive yourself to the hospital. Summary Hypertension is when the force of blood pumping through your arteries is too strong. If this condition is not controlled, it may put you at risk for serious complications. Your personal target blood pressure may vary depending on your medical conditions, your age, and other factors. For most people, a normal blood pressure is less than 120/80. Hypertension is treated with lifestyle changes, medicines, or a combination of both. Lifestyle changes include losing weight, eating a healthy,   low-sodium diet, exercising more, and limiting alcohol. This information is not intended to replace advice given to you by your health care provider. Make sure you discuss any questions you have with your health care provider. Document Revised: 08/21/2021 Document Reviewed: 08/21/2021 Elsevier Patient Education  2023 [employer]  

## 2022-12-12 DIAGNOSIS — Z1211 Encounter for screening for malignant neoplasm of colon: Secondary | ICD-10-CM | POA: Insufficient documentation

## 2022-12-12 LAB — CULTURE, URINE COMPREHENSIVE

## 2022-12-13 ENCOUNTER — Other Ambulatory Visit: Payer: Self-pay | Admitting: Internal Medicine

## 2022-12-13 ENCOUNTER — Telehealth: Payer: Self-pay

## 2022-12-13 DIAGNOSIS — I10 Essential (primary) hypertension: Secondary | ICD-10-CM

## 2022-12-13 MED ORDER — AMLODIPINE BESYLATE 5 MG PO TABS: 5.0000 mg | ORAL_TABLET | Freq: Every day | ORAL | 0 refills | Status: DC

## 2022-12-13 NOTE — Telephone Encounter (Addendum)
Since pt stopped BP meds pt states she walked from one end of her building at work thi morning and noticed when she went back to her side she started to feel very dizzy and funny. Pt was wanting to know if she should start to re-take BP meds as Dr. Ronnald Ramp has taken her cause her BP was low.  I asked pt was she able to check her BP to see if it has gotten lower or has it risen and pt has stated she did not check her BP. I advised pt to please check BP and to keep a log of her BP the next few days especially when these episodes are happening to see if her BP is dropping to low or going to high. Pt states she is going to check her BP now and will call us back with the numbers to her BP.  Pt denied, SOB, Chest pains, Nausea.

## 2022-12-13 NOTE — Telephone Encounter (Signed)
Spoke with patient today. 

## 2022-12-13 NOTE — Telephone Encounter (Signed)
Pt has called back and stated her BP is 148/62.

## 2022-12-17 ENCOUNTER — Ambulatory Visit (HOSPITAL_COMMUNITY)
Admission: EM | Admit: 2022-12-17 | Discharge: 2022-12-17 | Disposition: A | Discharge status: Home / Self Care | Payer: BC Managed Care – PPO | Attending: Family Medicine | Admitting: Family Medicine

## 2022-12-17 ENCOUNTER — Encounter (HOSPITAL_COMMUNITY): Payer: Self-pay

## 2022-12-17 ENCOUNTER — Ambulatory Visit (INDEPENDENT_AMBULATORY_CARE_PROVIDER_SITE_OTHER): Payer: BC Managed Care – PPO

## 2022-12-17 DIAGNOSIS — M109 Gout, unspecified: Secondary | ICD-10-CM

## 2022-12-17 DIAGNOSIS — M25561 Pain in right knee: Secondary | ICD-10-CM | POA: Diagnosis not present

## 2022-12-17 MED ORDER — KETOROLAC TROMETHAMINE 30 MG/ML IJ SOLN
INTRAMUSCULAR | Status: AC
  Filled 2022-12-17: qty 1

## 2022-12-17 MED ORDER — HYDROCODONE-ACETAMINOPHEN 5-325 MG PO TABS: 1.0000 | ORAL_TABLET | Freq: Four times a day (QID) | ORAL | 0 refills | Status: AC | PRN

## 2022-12-17 MED ORDER — METHYLPREDNISOLONE 4 MG PO TBPK: ORAL_TABLET | ORAL | 0 refills | Status: DC

## 2022-12-17 MED ORDER — COLCHICINE 0.6 MG PO TABS: 0.6000 mg | ORAL_TABLET | Freq: Every day | ORAL | 0 refills | Status: DC

## 2022-12-17 MED ORDER — KETOROLAC TROMETHAMINE 30 MG/ML IJ SOLN
30.0000 mg | Freq: Once | INTRAMUSCULAR | Status: AC
  Administered 2022-12-17: 30 mg via INTRAMUSCULAR

## 2022-12-17 NOTE — ED Provider Notes (Signed)
[name]    CSN: [id number] Arrival date & time: 12/17/22  0849      History   Chief Complaint Chief Complaint  Patient presents with   Joint Swelling    HPI Marie Torres is a 62 y.o. female.   Patient is here for right knee pain/swelling.  Woke up 3 days ago with pain and swelling.  No known injury that she is aware of. The knee does not feel warm to the touch or hot  History of gout, usually in the left foot.  She has never had anything in her right leg.  She is cold, but no fever.  She took her gout mediation yesterday.  She just started taking indomethacin 165m daily  for the last several weeks.         Past Medical History:  Diagnosis Date   Arthritis    right hand   Chicken pox    Childhood   Diverticulosis of colon (without mention of hemorrhage)    GERD (gastroesophageal reflux disease)    Hiatal hernia    Hyperplastic colon polyp    Mass of finger of right hand 02/2013   right middle finger   Seasonal allergies    Sinus congestion 03/16/2013   cough, stuffy and runny nose   Urine incontinence    Vitamin D deficiency    Wears partial dentures    upper    Patient Active Problem List   Diagnosis Date Noted   Screen for colon cancer 12/12/2022   Idiopathic gout of multiple sites 11/05/2022   Effusion of right elbow 11/04/2022   OAB (overactive bladder) 11/22/2021   Stage 3a chronic kidney disease ([place] 11/02/2020   Hyperplastic colonic polyp 06/29/2020   Cervical cancer screening 06/28/2020   Vitamin D deficiency disease 04/29/2019   Essential hypertension 04/29/2019   Hyperlipidemia with target LDL less than 100 04/29/2019   Routine general medical examination at a health care facility 06/04/2016   Visit for screening mammogram 06/04/2016   Insomnia 08/17/2015   Rectal incontinence 08/17/2015   ESOPHAGITIS, REFLUX 08/14/2007    Past Surgical History:  Procedure Laterality Date   COLONOSCOPY  08/14/2007   562.10, 211.3    FOOT SURGERY Bilateral    exc. ingrown toenail great toe   TUBAL LIGATION  09/02/2002    OB History     Gravida  1   Para  1   Term  1   Preterm      AB      Living  1      SAB      IAB      Ectopic      Multiple      Live Births               Home Medications    Prior to Admission medications   Medication Sig Start Date End Date Taking? Authorizing Provider  allopurinol (ZYLOPRIM) 100 MG tablet Take 1 tablet (100 mg total) by mouth daily. 12/06/22  Yes BElba Barman DO  amLODipine (NORVASC) 5 MG tablet Take 1 tablet (5 mg total) by mouth daily. 12/13/22  Yes JJanith Lima MD  colchicine 0.6 MG tablet Take 1 tablet (0.6 mg total) by mouth 2 (two) times daily. 11/05/22  Yes JJanith Lima MD  rosuvastatin (CRESTOR) 5 MG tablet TAKE 1 TABLET (5 MG TOTAL) BY MOUTH DAILY. 08/05/22  Yes JJanith Lima MD  solifenacin (VESICARE) 10 MG tablet Take 1 tablet (10  mg total) by mouth daily. 12/10/22  Yes Janith Lima, MD  Cholecalciferol 1.25 MG (50000 UT) capsule TAKE 1 CAPSULE BY MOUTH ONE TIME PER WEEK    [provider]  diazepam (VALIUM) 5 MG tablet 1 po 30 mins prior to MRI    [provider]  fluticasone (FLONASE) 50 MCG/ACT nasal spray Place 1 spray into both nostrils daily. 06/24/22   Henson, Vickie L, NP-C  levocetirizine (XYZAL) 5 MG tablet Take 1 tablet (5 mg total) by mouth every evening. 06/24/22   Henson, Vickie L, NP-C  lidocaine (XYLOCAINE) 5 % ointment Apply 1 application topically as needed. 09/28/21   Felipa Furnace, DPM  methocarbamol (ROBAXIN) 500 MG tablet TAKE 1 TABLET BY MOUTH TWICE A DAY AS NEEDED FOR SPASMS    [provider]  oxyCODONE (OXY IR/ROXICODONE) 5 MG immediate release tablet Take 1 tablet (5 mg total) by mouth every 4 (four) hours as needed for severe pain. 11/05/22   Janith Lima, MD    Family History Family History  Problem Relation Age of Onset   Kidney disease Father    Heart disease Father    Stroke  Father    Hypertension Father    Heart disease Mother    Depression Mother    Cancer Sister        lung   Breast cancer Sister        both breast removed   Colon cancer Neg Hx    Esophageal cancer Neg Hx     Social History Social History   Tobacco Use   Smoking status: Former   Smokeless tobacco: Never   Tobacco comments:    quit smoking 20 years ago  Vaping Use   Vaping Use: Never used  Substance Use Topics   Alcohol use: Yes    Alcohol/week: 14.0 standard drinks of alcohol    Types: 14 Glasses of wine per week    Comment: occasionally   Drug use: No     Allergies   Shellfish allergy   Review of Systems Review of Systems  Constitutional: Negative.   HENT: Negative.    Respiratory: Negative.    Cardiovascular: Negative.   Gastrointestinal: Negative.   Musculoskeletal:  Positive for joint swelling.     Physical Exam Triage Vital Signs ED Triage Vitals  Enc Vitals Group     BP 12/17/22 1014 136/82     Pulse Rate 12/17/22 1014 68     Resp 12/17/22 1014 12     Temp 12/17/22 1014 98 F (36.7 C)     Temp Source 12/17/22 1014 Oral     SpO2 12/17/22 1014 97 %     Weight --      Height --      Head Circumference --      Peak Flow --      Pain Score 12/17/22 1009 9     Pain Loc --      Pain Edu? --      Excl. in [place]? --    No data found.  Updated Vital Signs BP 136/82 (BP Location: Right Arm)   Pulse 68   Temp 98 F (36.7 C) (Oral)   Resp 12   SpO2 97%   Visual Acuity Right Eye Distance:   Left Eye Distance:   Bilateral Distance:    Right Eye Near:   Left Eye Near:    Bilateral Near:     Physical Exam Constitutional:  Appearance: Normal appearance.  Cardiovascular:     Rate and Rhythm: Normal rate.  Pulmonary:     Effort: Pulmonary effort is normal.  Musculoskeletal:     Comments: The right knee is swollen;  it is warm to the touch;  she has radiating pain down the leg;  pain with any movement of the right knee;  difficulty bearing  weight due to pain  Skin:    General: Skin is warm.  Neurological:     General: No focal deficit present.     Mental Status: She is alert.  Psychiatric:        Mood and Affect: Mood normal.      UC Treatments / Results  Labs (all labs ordered are listed, but only abnormal results are displayed) Labs Reviewed - No data to display  EKG   Radiology DG Knee Complete 4 Views Right  Result Date: 12/17/2022 CLINICAL DATA:  RIGHT knee pain EXAM: RIGHT KNEE - COMPLETE 4+ VIEW COMPARISON:  None Available. FINDINGS: No fracture of the proximal tibia or distal femur. Patella is normal. No joint effusion. IMPRESSION: No acute osseous abnormality.  No arthropathy. Electronically Signed   By: Suzy Bouchard M.D.   On: 12/17/2022 12:00    Procedures Procedures (including critical care time)  Medications Ordered in UC Medications  ketorolac (TORADOL) 30 MG/ML injection 30 mg (30 mg Intramuscular Given 12/17/22 1202)    Initial Impression / Assessment and Plan / UC Course  I have reviewed the triage vital signs and the nursing notes.  Pertinent labs & imaging results that were available during my care of the patient were reviewed by me and considered in my medical decision making (see chart for details).    Final Clinical Impressions(s) / UC Diagnoses   Final diagnoses:  Acute pain of right knee  Acute gout of right knee, unspecified cause     Discharge Instructions      You were seen today for knee pain.  I think this is likely a gouty attack.  Your xray is normal.  I have sent out a medrol dose pack as well as colchicine for the gouty attack.  I have also sent out a few pills for the pain.  You  should continue the indomethacin as well.  Please follow up with the orthopedist as already scheduled.  Return if not improving or worsening.     ED Prescriptions     Medication Sig Dispense Auth. Provider   methylPREDNISolone (MEDROL DOSEPAK) 4 MG TBPK tablet Take as directed 1  each Doralene Glanz, MD   colchicine 0.6 MG tablet Take 1 tablet (0.6 mg total) by mouth daily. 10 tablet Carah Barrientes, MD   HYDROcodone-acetaminophen (NORCO/VICODIN) 5-325 MG tablet Take 1-2 tablets by mouth every 6 (six) hours as needed for up to 2 days. 8 tablet Rondel Oh, MD      PDMP not reviewed this encounter.   Rondel Oh, MD 12/17/22 1214

## 2022-12-17 NOTE — ED Triage Notes (Signed)
Pt is here for possible gout flare x 3days

## 2022-12-17 NOTE — Discharge Instructions (Addendum)
You were seen today for knee pain.  I think this is likely a gouty attack.  Your xray is normal.  I have sent out a medrol dose pack as well as colchicine for the gouty attack.  I have also sent out a few pills for the pain.  You  should continue the indomethacin as well.  Please follow up with the orthopedist as already scheduled.  Return if not improving or worsening.

## 2022-12-21 ENCOUNTER — Ambulatory Visit (HOSPITAL_COMMUNITY)
Admission: EM | Admit: 2022-12-21 | Discharge: 2022-12-21 | Disposition: A | Discharge status: Home / Self Care | Payer: BC Managed Care – PPO | Attending: Family Medicine | Admitting: Family Medicine

## 2022-12-21 ENCOUNTER — Encounter (HOSPITAL_COMMUNITY): Payer: Self-pay

## 2022-12-21 DIAGNOSIS — M25561 Pain in right knee: Secondary | ICD-10-CM

## 2022-12-21 DIAGNOSIS — I10 Essential (primary) hypertension: Secondary | ICD-10-CM

## 2022-12-21 MED ORDER — MELOXICAM 15 MG PO TABS: 15.0000 mg | ORAL_TABLET | Freq: Every day | ORAL | 0 refills | Status: DC

## 2022-12-21 NOTE — Discharge Instructions (Addendum)
Your blood pressure was noted to be elevated during your visit today. If you are currently taking medication for high blood pressure, please ensure you are taking this as directed. If you do not have a history of high blood pressure and your blood pressure remains persistently elevated, you may need to begin taking a medication at some point. You may return here within the next few days to recheck if unable to see your primary care provider or if you do not have a one.  BP (!) 160/77 (BP Location: Right Arm)   Pulse 76   Temp 98.2 F (36.8 C) (Oral)   Resp 12   SpO2 97%   BP Readings from Last 3 Encounters:  12/21/22 (!) 160/77  12/17/22 136/82  12/10/22 114/68

## 2022-12-21 NOTE — ED Triage Notes (Addendum)
Pt is here for follow up R knee pain . Pt states she still having pain and discomfort .

## 2022-12-24 ENCOUNTER — Ambulatory Visit (INDEPENDENT_AMBULATORY_CARE_PROVIDER_SITE_OTHER): Payer: BC Managed Care – PPO | Admitting: Sports Medicine

## 2022-12-24 ENCOUNTER — Ambulatory Visit: Payer: Self-pay

## 2022-12-24 ENCOUNTER — Encounter: Payer: Self-pay | Admitting: Sports Medicine

## 2022-12-24 DIAGNOSIS — M109 Gout, unspecified: Secondary | ICD-10-CM | POA: Diagnosis not present

## 2022-12-24 DIAGNOSIS — M1A09X0 Idiopathic chronic gout, multiple sites, without tophus (tophi): Secondary | ICD-10-CM | POA: Diagnosis not present

## 2022-12-24 DIAGNOSIS — M25561 Pain in right knee: Secondary | ICD-10-CM | POA: Diagnosis not present

## 2022-12-24 DIAGNOSIS — M25461 Effusion, right knee: Secondary | ICD-10-CM | POA: Diagnosis not present

## 2022-12-24 MED ORDER — LIDOCAINE HCL 1 % IJ SOLN
2.0000 mL | INTRAMUSCULAR | Status: AC | PRN
  Administered 2022-12-24: 2 mL

## 2022-12-24 MED ORDER — METHYLPREDNISOLONE ACETATE 40 MG/ML IJ SUSP
80.0000 mg | INTRAMUSCULAR | Status: AC | PRN
  Administered 2022-12-24: 80 mg via INTRA_ARTICULAR

## 2022-12-24 MED ORDER — COLCHICINE 0.6 MG PO TABS: 0.6000 mg | ORAL_TABLET | Freq: Two times a day (BID) | ORAL | 0 refills | Status: DC

## 2022-12-24 MED ORDER — BUPIVACAINE HCL 0.25 % IJ SOLN
2.0000 mL | INTRAMUSCULAR | Status: AC | PRN
  Administered 2022-12-24: 2 mL via INTRA_ARTICULAR

## 2022-12-24 NOTE — Progress Notes (Signed)
Marie Torres - 62 y.o. female MRN [id number]  Date of birth: 06/21/1961  Office Visit Note: Visit Date: 12/24/2022 PCP: Pcp, No Referred by: No ref. provider found  Subjective: Chief Complaint  Patient presents with   Right Knee - Pain   HPI: Marie Torres is a pleasant 62 y.o. female who presents today for acute swelling of right knee, with history of gout of multiple sites.   Patient has had acute swelling, warmth and pain to the right knee for 1 week.  She has a history of gout on multiple sites including the feet, her right elbow, but to her knowledge has never had an outbreak here in the knee.  Her pain is so bad she cannot ambulate, needing crutches.  She has been taking meloxicam and continues on her allopurinol 100 mg daily.  Per chart review, was seen at the [facility] urgent care on 12/17/2022.  She was given 30 mg of IM Toradol injection.  Was also prescribed colchicine.Marland Kitchen  She was given Norco, but unsure if she picked this up.  Pertinent ROS were reviewed with the patient and found to be negative unless otherwise specified above in HPI.   Assessment & Plan: Visit Diagnoses:  1. Pain and swelling of right knee   2. Acute gout of right knee, unspecified cause   3. Idiopathic chronic gout of multiple sites without tophus    Plan: Discussed with [name] she has had an acute flare of her underlying gout.  She previously had a flare of the right elbow which resolved with conservative measures.  Discussed today that she has a large joint effusion.  Discussed all treatment options, patient decision-making elected to proceed with ultrasound-guided aspiration and injection.  The synovial fluid aspirate was very indicative of gout given the bright yellow appearance of the fluid.  She will continue her allopurinol 100 mg daily.  Given the acuity of her recent flare, she will use colchicine 0.6 mg twice daily and to her follow-up appointment with me on 01/03/2023.  At this visit, we will draw  uric acid levels as she will be on the allopurinol medication for about 1 month at that point.  Uric acid level goal less than 6.  May need to increase to 200 mg, but will wait for laboratory draw.  May use ice multiple times a day to help with the pain and swelling.  Follow-up: Return for Has f/u appt on 01/03/23.   Meds & Orders:  Meds ordered this encounter  Medications   colchicine 0.6 MG tablet    Sig: Take 1 tablet (0.6 mg total) by mouth 2 (two) times daily for 21 days.    Dispense:  40 tablet    Refill:  0    Orders Placed This Encounter  Procedures   US Guided Needle Placement - No Linked Charges     Procedures: Large Joint Inj: R knee on 12/24/2022 11:48 AM Indications: pain and joint swelling Details: 18 G 1.5 in needle, ultrasound-guided superolateral approach Medications: 2 mL lidocaine 1 %; 2 mL bupivacaine 0.25 %; 80 mg methylPREDNISolone acetate 40 MG/ML Aspirate: 36 mL yellow Outcome: tolerated well, no immediate complications  US-guided Knee Aspiration and Injection, Rightc: After discussion on risks/benefits/indications was provided, informed verbal consent was obtained and a timeout was performed, patient was lying supine on exam table. The knee was prepped with chloraprep and alcohol swabs.  The overlying soft tissue was anesthetized with 4 cc of lidocaine 1% only. Utilizing ultrasound  guidance and a superolateral approach, an 18g needle on 60cc syringe was inserted using an in-plane approach into the knee joint, approximately 36 mL of a bright-yellow-colored fluid was aspirated from the knee. Utilizing the same portal under ultrasound guidance, the knee joint was then injected with 2:2 bupivicaine:depomedrol.  Patient tolerated procedure well without immediate complications. Band-aid was applied.  Procedure, treatment alternatives, risks and benefits explained, specific risks discussed. Consent was given by the patient. Immediately prior to procedure a time out was  called to verify the correct patient, procedure, equipment, support staff and site/side marked as required. Patient was prepped and draped in the usual sterile fashion.          Clinical History: No specialty comments available.  She reports that she has quit smoking. She has never used smokeless tobacco.  Recent Labs    11/04/22 1404  LABURIC 7.1*    Objective:    Physical Exam  Gen: Well-appearing, in no acute distress; non-toxic CV: Well-perfused. Warm.  Resp: Breathing unlabored on room air; no wheezing. Psych: Fluid speech in conversation; appropriate affect; normal thought process Neuro: Sensation intact throughout. No gross coordination deficits.   Ortho Exam - Right knee: The right knee has a moderate to large effusion with mild warmth noted to the joint.  No overlying redness or erythema.  Range of motion is limited.  Patient has an antalgic gait.  Strength testing not tested today given acuity of pain and swelling.  Imaging:  *Independent review and interpretation of the right knee (4-views) x-ray from 12/17/22 was reviewed by myself.  There is no significant joint space narrowing, although this is likely a nonweightbearing view.  No acute fracture or bony abnormalities noted.  There is a trace joint effusion noted.  DG Knee Complete 4 Views Right CLINICAL DATA:  RIGHT knee pain  EXAM: RIGHT KNEE - COMPLETE 4+ VIEW  COMPARISON:  None Available.  FINDINGS: No fracture of the proximal tibia or distal femur. Patella is normal. No joint effusion.  IMPRESSION: No acute osseous abnormality.  No arthropathy.  Electronically Signed   By: Suzy Bouchard M.D.   On: 12/17/2022 12:00    Past Medical/Family/Surgical/Social History: Medications & Allergies reviewed per EMR, new medications updated. Patient Active Problem List   Diagnosis Date Noted   Screen for colon cancer 12/12/2022   Idiopathic gout of multiple sites 11/05/2022   Effusion of right elbow  11/04/2022   OAB (overactive bladder) 11/22/2021   Stage 3a chronic kidney disease ([place]) 11/02/2020   Hyperplastic colonic polyp 06/29/2020   Cervical cancer screening 06/28/2020   Vitamin D deficiency disease 04/29/2019   Essential hypertension 04/29/2019   Hyperlipidemia with target LDL less than 100 04/29/2019   Routine general medical examination at a health care facility 06/04/2016   Visit for screening mammogram 06/04/2016   Insomnia 08/17/2015   Rectal incontinence 08/17/2015   ESOPHAGITIS, REFLUX 08/14/2007   Past Medical History:  Diagnosis Date   Arthritis    right hand   Chicken pox    Childhood   Diverticulosis of colon (without mention of hemorrhage)    GERD (gastroesophageal reflux disease)    Hiatal hernia    Hyperplastic colon polyp    Mass of finger of right hand 02/2013   right middle finger   Seasonal allergies    Sinus congestion 03/16/2013   cough, stuffy and runny nose   Urine incontinence    Vitamin D deficiency    Wears partial dentures    upper  Family History  Problem Relation Age of Onset   Kidney disease Father    Heart disease Father    Stroke Father    Hypertension Father    Heart disease Mother    Depression Mother    Cancer Sister        lung   Breast cancer Sister        both breast removed   Colon cancer Neg Hx    Esophageal cancer Neg Hx    Past Surgical History:  Procedure Laterality Date   COLONOSCOPY  08/14/2007   562.10, 211.3   FOOT SURGERY Bilateral    exc. ingrown toenail great toe   TUBAL LIGATION  09/02/2002   Social History   Occupational History    Employer: [employer]  Tobacco Use   Smoking status: Former   Smokeless tobacco: Never   Tobacco comments:    quit smoking 20 years ago  Vaping Use   Vaping Use: Never used  Substance and Sexual Activity   Alcohol use: Yes    Alcohol/week: 14.0 standard drinks of alcohol    Types: 14 Glasses of wine per week    Comment: occasionally   Drug use: No   Sexual  activity: Yes    Partners: Male    Birth control/protection: Post-menopausal    Comment: 1st intercourse- 23, partners- 4,

## 2022-12-24 NOTE — Progress Notes (Signed)
No injury to knee 1 week of pain/swelling  History of gout

## 2022-12-24 NOTE — Patient Instructions (Addendum)
Marie Torres,  This swelling in your knee is likely another gout flare. We did drain the fluid and inject medicine into the knee today.  He should start feeling better over the next 2 days.  Things for you to do:  -Continue allopurinol 100 mg once daily -You will begin colchicine (small brown pill) 0.'6mg'$  --> take 1 tablet twice a day until your follow-up with me -Ice the knee twice a day for pain control -Follow-up with me for your regular appointment on March 8th. We will draw your blood at this visit.   *Dr. Rolena Infante

## 2022-12-25 ENCOUNTER — Telehealth: Payer: Self-pay

## 2022-12-25 DIAGNOSIS — I10 Essential (primary) hypertension: Secondary | ICD-10-CM

## 2022-12-25 MED ORDER — AMLODIPINE BESYLATE 5 MG PO TABS: 5.0000 mg | ORAL_TABLET | Freq: Every day | ORAL | 0 refills | Status: DC

## 2022-12-25 NOTE — Telephone Encounter (Signed)
Patient called and said she lost her amLODipine (NORVASC) 5 MG tablet . She would like to know if she can get it refilled at [id number] [place] [address] [place].  Best callback number is [phone]

## 2022-12-25 NOTE — ED Provider Notes (Signed)
[place]   PI:[id number] 12/21/22 Arrival Time: 1005  ASSESSMENT & PLAN:  1. Acute pain of right knee   2. Elevated blood pressure reading with diagnosis of hypertension     Discharge Medication List as of 12/21/2022 11:29 AM     START taking these medications   Details  meloxicam (MOBIC) 15 MG tablet Take 1 tablet (15 mg total) by mouth daily., Starting Sat 12/21/2022, Normal       Work/school excuse note: provided. Recommend:  Follow-up Information     Schedule an appointment as soon as possible for a visit  with Elba Barman, DO.   Specialty: Sports Medicine Contact information: [address] [place] [address] [phone]                 Reviewed expectations re: course of current medical issues. Questions answered. Outlined signs and symptoms indicating need for more acute intervention. Patient verbalized understanding. After Visit Summary given.  SUBJECTIVE: History from: patient. Marie Torres is a 62 y.o. female who was seen here on 12/17/2022; note reviewed; still with R knee pain. Has denied trauma. Worse with prolonged standing. Reveiwed imaging from 12/17/2022 DG Knee Complete 4 Views Right  Result Date: 12/17/2022 CLINICAL DATA:  RIGHT knee pain EXAM: RIGHT KNEE - COMPLETE 4+ VIEW COMPARISON:  None Available. FINDINGS: No fracture of the proximal tibia or distal femur. Patella is normal. No joint effusion. IMPRESSION: No acute osseous abnormality.  No arthropathy. Electronically Signed   By: Suzy Bouchard M.D.   On: 12/17/2022 12:00    Was given Medrol Dosepak and Norco. Norco helped with sleep. "Just still hurts". Is ambulatory. No extremity sensation changes or weakness.   Past Surgical History:  Procedure Laterality Date   COLONOSCOPY  08/14/2007   562.10, 211.3   FOOT SURGERY Bilateral    exc. ingrown toenail great toe   TUBAL LIGATION  09/02/2002    Increased blood pressure noted today. Reports that she is treated  for HTN. Taking medications. Denies CP/SOB/LE edema.   OBJECTIVE:  Vitals:   12/21/22 1029  BP: (!) 160/77  Pulse: 76  Resp: 12  Temp: 98.2 F (36.8 C)  TempSrc: Oral  SpO2: 97%    General appearance: alert; no distress HEENT: [place]; AT Neck: supple with FROM Resp: unlabored respirations Extremities: RLE: warm with well perfused appearance; poorly localized moderate tenderness over right anterior knee mostly but I cannot locate point tenerness; very mild effusion; without gross deformities; swelling: none; bruising: none; knee ROM: normal CV: brisk extremity capillary refill of RLE; 2+ DP pulse of RLE. Skin: warm and dry; no visible rashes Neurologic: gait normal; normal sensation and strength of RLE Psychological: alert and cooperative; normal mood and affect  Allergies  Allergen Reactions   Shellfish Allergy Hives    Past Medical History:  Diagnosis Date   Arthritis    right hand   Chicken pox    Childhood   Diverticulosis of colon (without mention of hemorrhage)    GERD (gastroesophageal reflux disease)    Hiatal hernia    Hyperplastic colon polyp    Mass of finger of right hand 02/2013   right middle finger   Seasonal allergies    Sinus congestion 03/16/2013   cough, stuffy and runny nose   Urine incontinence    Vitamin D deficiency    Wears partial dentures    upper   Social History   Socioeconomic History   Marital status: Single    Spouse name:  Not on file   Number of children: 1   Years of education: Not on file   Highest education level: Not on file  Occupational History    Employer: [employer]  Tobacco Use   Smoking status: Former   Smokeless tobacco: Never   Tobacco comments:    quit smoking 20 years ago  Vaping Use   Vaping Use: Never used  Substance and Sexual Activity   Alcohol use: Yes    Alcohol/week: 14.0 standard drinks of alcohol    Types: 14 Glasses of wine per week    Comment: occasionally   Drug use: No   Sexual activity: Yes     Partners: Male    Birth control/protection: Post-menopausal    Comment: 1st intercourse- 17, partners- 4,   Other Topics Concern   Not on file  Social History Narrative   Not on file   Social Determinants of Health   Financial Resource Strain: Not on file  Food Insecurity: Not on file  Transportation Needs: Not on file  Physical Activity: Not on file  Stress: Not on file  Social Connections: Not on file   Family History  Problem Relation Age of Onset   Kidney disease Father    Heart disease Father    Stroke Father    Hypertension Father    Heart disease Mother    Depression Mother    Cancer Sister        lung   Breast cancer Sister        both breast removed   Colon cancer Neg Hx    Esophageal cancer Neg Hx    Past Surgical History:  Procedure Laterality Date   COLONOSCOPY  08/14/2007   562.10, 211.3   FOOT SURGERY Bilateral    exc. ingrown toenail great toe   TUBAL LIGATION  09/02/2002       Vanessa Kick, MD 12/25/22 [id number]

## 2022-12-25 NOTE — Telephone Encounter (Signed)
Patient called to get medication filled. She could not remember the name, so she'll call back later.

## 2023-01-03 ENCOUNTER — Encounter: Payer: Self-pay | Admitting: Sports Medicine

## 2023-01-03 ENCOUNTER — Ambulatory Visit (INDEPENDENT_AMBULATORY_CARE_PROVIDER_SITE_OTHER): Payer: BC Managed Care – PPO | Admitting: Sports Medicine

## 2023-01-03 DIAGNOSIS — M1A09X0 Idiopathic chronic gout, multiple sites, without tophus (tophi): Secondary | ICD-10-CM

## 2023-01-03 DIAGNOSIS — M109 Gout, unspecified: Secondary | ICD-10-CM | POA: Diagnosis not present

## 2023-01-03 MED ORDER — MELOXICAM 15 MG PO TABS: 15.0000 mg | ORAL_TABLET | Freq: Every day | ORAL | 0 refills | Status: DC

## 2023-01-03 NOTE — Progress Notes (Signed)
Marie Torres - 62 y.o. female MRN [id number]  Date of birth: 03-05-1961  Office Visit Note: Visit Date: 01/03/2023 PCP: Janith Lima, MD Referred by: Janith Lima, MD  Subjective: Chief Complaint  Patient presents with   Right Elbow - Follow-up   Right Knee - Follow-up   HPI: Marie Torres is a pleasant 62 y.o. female who presents today for follow-up of gout of multiple Sites.  Previously had gout of the right elbow, this is treated with oral prednisone, ice and anti-inflammatories.)  Initially seen by me on 12/24/2022 with a gout flare of the right knee with a large effusion that was aspirated and subsequently injected.  At this time, she states she has had complete resolution of her pain.  Her knee is feeling excellent and no longer has any swelling.  She continues allopurinol 100 mg daily.  She does have meloxicam to take as needed, but only has a few pills left.  Has not been requiring this recently.  Pertinent ROS were reviewed with the patient and found to be negative unless otherwise specified above in HPI.   Assessment & Plan: Visit Diagnoses:  1. Idiopathic chronic gout of multiple sites without tophus   2. Acute gout of right knee, unspecified cause    Plan: Discussed with [name] that she has had complete resolution of her gouty flare after her subsequent aspiration and injection.  We did discuss the nature of her chronic gout, she is managed on allopurinol 100 mg daily, she will continue this for now.  We are drawing a uric acid level today.  Our goal is to keep her uric acid below 6 mg/dL (last was 7.1).  We will adjust her allopurinol for this target.  I will call her with these results in the next few days.  Discussed foods to avoid that will worsen uric acid and gout.  Did refill meloxicam only if she needs it if she feels a flare is coming or having inflammation/joint pain.  Follow-up: Return for will call regarding uric acid levels.   Meds & Orders:  Meds  ordered this encounter  Medications   meloxicam (MOBIC) 15 MG tablet    Sig: Take 1 tablet (15 mg total) by mouth daily.    Dispense:  30 tablet    Refill:  0    Orders Placed This Encounter  Procedures   Uric acid     Procedures: No procedures performed      Clinical History: No specialty comments available.  She reports that she has quit smoking. She has never used smokeless tobacco.  Recent Labs    11/04/22 1404  LABURIC 7.1*    Objective:    Physical Exam  Gen: Well-appearing, in no acute distress; non-toxic CV:  Well-perfused. Warm.  Resp: Breathing unlabored on room air; no wheezing. Psych: Fluid speech in conversation; appropriate affect; normal thought process Neuro: Sensation intact throughout. No gross coordination deficits.   Ortho Exam - Right elbow: No redness, swelling or effusion.  Full range of motion about the elbow in flexion and extension.  - Right knee: No effusion or swelling or warmth to the knee joint.  Full range of motion.  5/5 strength.  Imaging: No results found.  Past Medical/Family/Surgical/Social History: Medications & Allergies reviewed per EMR, new medications updated. Patient Active Problem List   Diagnosis Date Noted   Screen for colon cancer 12/12/2022   Idiopathic gout of multiple sites 11/05/2022   Effusion of right elbow 11/04/2022  OAB (overactive bladder) 11/22/2021   Stage 3a chronic kidney disease ([place]) 11/02/2020   Hyperplastic colonic polyp 06/29/2020   Cervical cancer screening 06/28/2020   Vitamin D deficiency disease 04/29/2019   Essential hypertension 04/29/2019   Hyperlipidemia with target LDL less than 100 04/29/2019   Routine general medical examination at a health care facility 06/04/2016   Visit for screening mammogram 06/04/2016   Insomnia 08/17/2015   Rectal incontinence 08/17/2015   ESOPHAGITIS, REFLUX 08/14/2007   Past Medical History:  Diagnosis Date   Arthritis    right hand   Chicken pox     Childhood   Diverticulosis of colon (without mention of hemorrhage)    GERD (gastroesophageal reflux disease)    Hiatal hernia    Hyperplastic colon polyp    Mass of finger of right hand 02/2013   right middle finger   Seasonal allergies    Sinus congestion 03/16/2013   cough, stuffy and runny nose   Urine incontinence    Vitamin D deficiency    Wears partial dentures    upper   Family History  Problem Relation Age of Onset   Kidney disease Father    Heart disease Father    Stroke Father    Hypertension Father    Heart disease Mother    Depression Mother    Cancer Sister        lung   Breast cancer Sister        both breast removed   Colon cancer Neg Hx    Esophageal cancer Neg Hx    Past Surgical History:  Procedure Laterality Date   COLONOSCOPY  08/14/2007   562.10, 211.3   FOOT SURGERY Bilateral    exc. ingrown toenail great toe   TUBAL LIGATION  09/02/2002   Social History   Occupational History    Employer: [employer]  Tobacco Use   Smoking status: Former   Smokeless tobacco: Never   Tobacco comments:    quit smoking 20 years ago  Vaping Use   Vaping Use: Never used  Substance and Sexual Activity   Alcohol use: Yes    Alcohol/week: 14.0 standard drinks of alcohol    Types: 14 Glasses of wine per week    Comment: occasionally   Drug use: No   Sexual activity: Yes    Partners: Male    Birth control/protection: Post-menopausal    Comment: 1st intercourse- 12, partners- 4,

## 2023-01-03 NOTE — Progress Notes (Signed)
States she is doing great; both elbow and knee are good

## 2023-01-04 LAB — URIC ACID: Uric Acid, Serum: 6.2 mg/dL (ref 2.5–7.0)

## 2023-01-06 ENCOUNTER — Other Ambulatory Visit: Payer: Self-pay | Admitting: Sports Medicine

## 2023-01-06 DIAGNOSIS — M109 Gout, unspecified: Secondary | ICD-10-CM

## 2023-01-06 DIAGNOSIS — M1A09X0 Idiopathic chronic gout, multiple sites, without tophus (tophi): Secondary | ICD-10-CM

## 2023-01-06 MED ORDER — ALLOPURINOL 200 MG PO TABS: 200.0000 mg | ORAL_TABLET | Freq: Every day | ORAL | 1 refills | Status: DC

## 2023-01-06 NOTE — Progress Notes (Signed)
Called patient, left voicemail as she previously requested. Uric acid is improving, but is still 6.2.  Will increase her allopurinol from 100 mg to 200 mg daily.  She may take 2 tablets of her 100 mg until completion.  I did send a new prescription for allopurinol 200 mg for her to take once daily once this starts.  I would like her to follow-up in about 5 weeks after she starts taking this new dose of medication.  Marie Barman, DO Primary Care Sports Medicine Physician  [facility]

## 2023-01-21 ENCOUNTER — Encounter: Payer: Self-pay | Admitting: Sports Medicine

## 2023-01-21 ENCOUNTER — Other Ambulatory Visit: Payer: Self-pay

## 2023-01-21 ENCOUNTER — Ambulatory Visit (INDEPENDENT_AMBULATORY_CARE_PROVIDER_SITE_OTHER): Payer: BC Managed Care – PPO | Admitting: Sports Medicine

## 2023-01-21 ENCOUNTER — Telehealth (HOSPITAL_BASED_OUTPATIENT_CLINIC_OR_DEPARTMENT_OTHER): Payer: Self-pay

## 2023-01-21 DIAGNOSIS — M25561 Pain in right knee: Secondary | ICD-10-CM

## 2023-01-21 DIAGNOSIS — G8929 Other chronic pain: Secondary | ICD-10-CM | POA: Diagnosis not present

## 2023-01-21 DIAGNOSIS — M109 Gout, unspecified: Secondary | ICD-10-CM | POA: Diagnosis not present

## 2023-01-21 DIAGNOSIS — M1A09X0 Idiopathic chronic gout, multiple sites, without tophus (tophi): Secondary | ICD-10-CM | POA: Diagnosis not present

## 2023-01-21 DIAGNOSIS — M25461 Effusion, right knee: Secondary | ICD-10-CM | POA: Diagnosis not present

## 2023-01-21 MED ORDER — BUPIVACAINE HCL 0.25 % IJ SOLN
2.0000 mL | INTRAMUSCULAR | Status: AC | PRN
  Administered 2023-01-21: 2 mL via INTRA_ARTICULAR

## 2023-01-21 MED ORDER — ALLOPURINOL 200 MG PO TABS: 200.0000 mg | ORAL_TABLET | Freq: Every day | ORAL | 1 refills | Status: DC

## 2023-01-21 MED ORDER — METHYLPREDNISOLONE ACETATE 40 MG/ML IJ SUSP
40.0000 mg | INTRAMUSCULAR | Status: AC | PRN
  Administered 2023-01-21: 40 mg via INTRA_ARTICULAR

## 2023-01-21 MED ORDER — LIDOCAINE HCL 1 % IJ SOLN
4.0000 mL | INTRAMUSCULAR | Status: AC | PRN
  Administered 2023-01-21: 4 mL

## 2023-01-21 NOTE — Progress Notes (Signed)
Marie Torres - 62 y.o. female MRN [id number]  Date of birth: January 01, 1961  Office Visit Note: Visit Date: 01/21/2023 PCP: Janith Lima, MD Referred by: Janith Lima, MD  Subjective: Chief Complaint  Patient presents with   Right Knee - Pain   HPI: Marie Torres is a pleasant 62 y.o. female who presents today for acute on chronic right knee pain and swelling.  Acute on chronic gout - affected multiple sites. Knee, elbow, previously in foot.  Back on 11/15/2022 we did aspirate and inject the knee.  Excellent relief until just a few days ago feel like her knee pain started become exacerbated and she started getting swelling and some warmth back in the knee.  Denies any injury.  She did take meloxicam 15 mg once today.  She has not yet increased her allopurinol, still taking 100 mg daily.  He is making dietary changes, but is still eating seafood--she did have some this past weekend.  About a month ago did aspiration and inject the knee.  Pertinent ROS were reviewed with the patient and found to be negative unless otherwise specified above in HPI.   Assessment & Plan: Visit Diagnoses:  1. Pain and swelling of right knee   2. Idiopathic chronic gout of multiple sites without tophus   3. Acute gout of right knee, unspecified cause   4. Chronic pain of right knee    Plan: Discussed with [name] today treatment options for her recurrent knee effusion.  Discussed that she does have some mild patellofemoral arthritis, but I do think that this is an exacerbation of her acute on chronic gout.  She has had gouty flares of both the knee and elbow.  Xu decision-making, did proceed with ultrasound-guided aspiration and subsequent injection.  Patient tolerated well.  I do have a high suspicion that this is gout, but to confirm and rule out any other underlying pathology we will perform fluid studies and culture of the aspirate, this was sent off today.  I would like her to resume meloxicam 15 mg to  be taken once daily for the remainder of this week, then may transition to as needed.  She does have colchicine 0.6 mg to take as needed for flares.  We did need to control gout from the source, she will increase her allopurinol from 100 mg to 200 mg, she unfortunately did not check her messages to make this change on her own previously.  She will begin allopurinol 200 mg starting today, I would like her to come back in 1 month to repeat uric acid blood levels.  She is agreeable to this plan.  Discussed dietary modifications and reducing purines in her diet.  Follow-up: Return in about 4 weeks (around 02/18/2023) for f/u 4-5 weeks for gout f/u (draw uric acid).   Meds & Orders:  Meds ordered this encounter  Medications   Allopurinol 200 MG TABS    Sig: Take 200 mg by mouth daily.    Dispense:  60 tablet    Refill:  1    Orders Placed This Encounter  Procedures   Anaerobic and Aerobic Culture   [place] Extrem Low Right Ltd   Synovial Fluid Analysis, Complete     Procedures: Large Joint Inj: R knee on 01/21/2023 4:35 PM Indications: pain, joint swelling and diagnostic evaluation Details: 18 G 1.5 in needle, ultrasound-guided superolateral approach Medications: 4 mL lidocaine 1 %; 2 mL bupivacaine 0.25 %; 40 mg methylPREDNISolone acetate 40 MG/ML Aspirate:  15 mL yellow and cloudy; sent for lab analysis Outcome: tolerated well, no immediate complications Procedure, treatment alternatives, risks and benefits explained, specific risks discussed. Consent was given by the patient. Immediately prior to procedure a time out was called to verify the correct patient, procedure, equipment, support staff and site/side marked as required. Patient was prepped and draped in the usual sterile fashion.          Clinical History: No specialty comments available.  She reports that she has quit smoking. She has never used smokeless tobacco.  Recent Labs    11/04/22 1404 01/03/23 1530  LABURIC 7.1* 6.2     Objective:    Physical Exam  Gen: Well-appearing, in no acute distress; non-toxic CV: Regular Rate. Well-perfused. Warm.  Resp: Breathing unlabored on room air; no wheezing. Psych: Fluid speech in conversation; appropriate affect; normal thought process Neuro: Sensation intact throughout. No gross coordination deficits.   Ortho Exam - Right knee: Evaluation of the right knee demonstrates a small to moderate effusion with mild warmth.  There is no surrounding redness or erythema.  Range of motion is from 0-120 degrees with pain at endrange flexion.  Patient walks with a mildly antalgic gait.  Ligamentously intact.  Imaging:  *Independent review interpretation of the right knee x-ray, 4 view from 12/17/2022 was reviewed and interpreted myself.  X-rays demonstrate preserved medial and lateral joint spaces, there is some mild patellofemoral arthritic change.  No acute fracture or other bony abnormality noted.  CLINICAL DATA:  RIGHT knee pain   EXAM: RIGHT KNEE - COMPLETE 4+ VIEW   COMPARISON:  None Available.   FINDINGS: No fracture of the proximal tibia or distal femur. Patella is normal. No joint effusion.   IMPRESSION: No acute osseous abnormality.  No arthropathy.     Electronically Signed   By: Suzy Bouchard M.D.   On: 12/17/2022 12:00    Past Medical/Family/Surgical/Social History: Medications & Allergies reviewed per EMR, new medications updated. Patient Active Problem List   Diagnosis Date Noted   Screen for colon cancer 12/12/2022   Idiopathic gout of multiple sites 11/05/2022   Effusion of right elbow 11/04/2022   OAB (overactive bladder) 11/22/2021   Stage 3a chronic kidney disease ([place]) 11/02/2020   Hyperplastic colonic polyp 06/29/2020   Cervical cancer screening 06/28/2020   Vitamin D deficiency disease 04/29/2019   Essential hypertension 04/29/2019   Hyperlipidemia with target LDL less than 100 04/29/2019   Routine general medical examination at a  health care facility 06/04/2016   Visit for screening mammogram 06/04/2016   Insomnia 08/17/2015   Rectal incontinence 08/17/2015   ESOPHAGITIS, REFLUX 08/14/2007   Past Medical History:  Diagnosis Date   Arthritis    right hand   Chicken pox    Childhood   Diverticulosis of colon (without mention of hemorrhage)    GERD (gastroesophageal reflux disease)    Hiatal hernia    Hyperplastic colon polyp    Mass of finger of right hand 02/2013   right middle finger   Seasonal allergies    Sinus congestion 03/16/2013   cough, stuffy and runny nose   Urine incontinence    Vitamin D deficiency    Wears partial dentures    upper   Family History  Problem Relation Age of Onset   Kidney disease Father    Heart disease Father    Stroke Father    Hypertension Father    Heart disease Mother    Depression Mother  Cancer Sister        lung   Breast cancer Sister        both breast removed   Colon cancer Neg Hx    Esophageal cancer Neg Hx    Past Surgical History:  Procedure Laterality Date   COLONOSCOPY  08/14/2007   562.10, 211.3   FOOT SURGERY Bilateral    exc. ingrown toenail great toe   TUBAL LIGATION  09/02/2002   Social History   Occupational History    Employer: [employer]  Tobacco Use   Smoking status: Former   Smokeless tobacco: Never   Tobacco comments:    quit smoking 20 years ago  Vaping Use   Vaping Use: Never used  Substance and Sexual Activity   Alcohol use: Yes    Alcohol/week: 14.0 standard drinks of alcohol    Types: 14 Glasses of wine per week    Comment: occasionally   Drug use: No   Sexual activity: Yes    Partners: Male    Birth control/protection: Post-menopausal    Comment: 1st intercourse- 31, partners- 4,

## 2023-01-21 NOTE — Telephone Encounter (Signed)
Patient called triage stating she wanted to be seen today due to fluid on R knee and unable to walk. She did take a meloxicam this am to relieve some of the pain.  Please advise as I know yall are completely booked this week.

## 2023-01-27 LAB — SYNOVIAL FLUID ANALYSIS, COMPLETE
Basophils, %: 0 %
Eosinophils-Synovial: 0 % (ref 0–2)
Lymphocytes-Synovial Fld: 22 % (ref 0–74)
Monocyte/Macrophage: 24 % (ref 0–69)
Neutrophil, Synovial: 54 % — ABNORMAL HIGH (ref 0–24)
Synoviocytes, %: 0 % (ref 0–15)
WBC, Synovial: 167 {cells}/uL — ABNORMAL HIGH

## 2023-01-27 LAB — ANAEROBIC AND AEROBIC CULTURE
AER RESULT:: NO GROWTH
GRAM STAIN:: NONE SEEN
MICRO NUMBER:: 14744257
MICRO NUMBER:: 14744258
SPECIMEN QUALITY:: ADEQUATE
SPECIMEN QUALITY:: ADEQUATE

## 2023-02-09 ENCOUNTER — Other Ambulatory Visit: Payer: Self-pay | Admitting: Sports Medicine

## 2023-02-13 ENCOUNTER — Encounter: Payer: Self-pay | Admitting: Internal Medicine

## 2023-02-18 ENCOUNTER — Ambulatory Visit (INDEPENDENT_AMBULATORY_CARE_PROVIDER_SITE_OTHER): Payer: BC Managed Care – PPO | Admitting: Sports Medicine

## 2023-02-18 ENCOUNTER — Encounter: Payer: Self-pay | Admitting: Sports Medicine

## 2023-02-18 DIAGNOSIS — M1A09X0 Idiopathic chronic gout, multiple sites, without tophus (tophi): Secondary | ICD-10-CM

## 2023-02-18 DIAGNOSIS — G8929 Other chronic pain: Secondary | ICD-10-CM

## 2023-02-18 DIAGNOSIS — M25561 Pain in right knee: Secondary | ICD-10-CM

## 2023-02-18 LAB — URIC ACID: Uric Acid, Serum: 5.1 mg/dL (ref 2.5–7.0)

## 2023-02-18 NOTE — Progress Notes (Signed)
Gout management today; Doing good, has not had any flareups

## 2023-02-18 NOTE — Progress Notes (Signed)
Marie Torres - 62 y.o. female MRN [id number]  Date of birth: February 17, 1961  Office Visit Note: Visit Date: 02/18/2023 PCP: Etta Grandchild, MD Referred by: Etta Grandchild, MD  Subjective: Chief Complaint  Patient presents with   Right Knee - Follow-up   HPI: Marie Torres is a pleasant 62 y.o. female who presents today for follow-up of gout management.  She continues on allopurinol 200 mg once daily.  She started this change on 01/21/2023.  She did have a flareup of her right knee pain and effusion last month which we aspirated and injected.  She has been working on diet changes, cutting out all red meat, has not drinking any wine or alcohol.  Also has cut back largely on any fish or seafood.  She only takes meloxicam as needed.  She is doing well without any recurrence of her pain or swelling.  Pertinent ROS were reviewed with the patient and found to be negative unless otherwise specified above in HPI.   Assessment & Plan: Visit Diagnoses:  1. Idiopathic chronic gout of multiple sites without tophus   2. Chronic pain of right knee    Plan: Discussed with [name] that we both are glad she is doing well from her previous knee pain and swelling.  She does have a history of gout both in the elbow and the knee, although currently has no acute flare.  He has been taking her allopurinol 100 -->  daily.  Discussed she will continue 200 mg dosing to help keep her uric acid levels at days.  Goal is for uric acid to be less than 6 mg/dL.  We will repeat uric acid testing today, I will call her if any medication adjustment is needed.  She can continue meloxicam 15 mg only as needed.   Follow-up: Return if symptoms worsen or fail to improve.   Meds & Orders: No orders of the defined types were placed in this encounter.   Orders Placed This Encounter  Procedures   Uric acid     Procedures: No procedures performed      Clinical History: No specialty comments available.  She reports  that she has quit smoking. She has never used smokeless tobacco.  Recent Labs    11/04/22 1404 01/03/23 1530  LABURIC 7.1* 6.2      Component 4 wk ago  MICRO NUMBER: [id number]  SPECIMEN QUALITY: Adequate  Source: FLUID  STATUS: FINAL  GRAM STAIN: No organisms or white blood cells seen  ANA RESULT: No anaerobes isolated.  MICRO NUMBER: [id number]  SPECIMEN QUALITY: Adequate  SOURCE: FLUID  STATUS: FINAL  AER RESULT: No Growth  Resulting Agency [facility]     Objective:    Physical Exam  Gen: Well-appearing, in no acute distress; non-toxic CV: Well-perfused. Warm.  Resp: Breathing unlabored on room air; no wheezing. Psych: Fluid speech in conversation; appropriate affect; normal thought process Neuro: Sensation intact throughout. No gross coordination deficits.   Ortho Exam - Right knee: There is no redness or swelling.  No TTP over the medial or lateral joint line.  Full range of motion from 0-135 degrees.  Imaging: No results found.  Past Medical/Family/Surgical/Social History: Medications & Allergies reviewed per EMR, new medications updated. Patient Active Problem List   Diagnosis Date Noted   Screen for colon cancer 12/12/2022   Idiopathic gout of multiple sites 11/05/2022   Effusion of right elbow 11/04/2022   OAB (overactive bladder) 11/22/2021   Stage 3a chronic  kidney disease 11/02/2020   Hyperplastic colonic polyp 06/29/2020   Cervical cancer screening 06/28/2020   Vitamin D deficiency disease 04/29/2019   Essential hypertension 04/29/2019   Hyperlipidemia with target LDL less than 100 04/29/2019   Routine general medical examination at a health care facility 06/04/2016   Visit for screening mammogram 06/04/2016   Insomnia 08/17/2015   Rectal incontinence 08/17/2015   ESOPHAGITIS, REFLUX 08/14/2007   Past Medical History:  Diagnosis Date   Arthritis    right hand   Chicken pox    Childhood   Diverticulosis of colon (without  mention of hemorrhage)    GERD (gastroesophageal reflux disease)    Hiatal hernia    Hyperplastic colon polyp    Mass of finger of right hand 02/2013   right middle finger   Seasonal allergies    Sinus congestion 03/16/2013   cough, stuffy and runny nose   Urine incontinence    Vitamin D deficiency    Wears partial dentures    upper   Family History  Problem Relation Age of Onset   Kidney disease Father    Heart disease Father    Stroke Father    Hypertension Father    Heart disease Mother    Depression Mother    Cancer Sister        lung   Breast cancer Sister        both breast removed   Colon cancer Neg Hx    Esophageal cancer Neg Hx    Past Surgical History:  Procedure Laterality Date   COLONOSCOPY  08/14/2007   562.10, 211.3   FOOT SURGERY Bilateral    exc. ingrown toenail great toe   TUBAL LIGATION  09/02/2002   Social History   Occupational History    Employer: [employer]  Tobacco Use   Smoking status: Former   Smokeless tobacco: Never   Tobacco comments:    quit smoking 20 years ago  Vaping Use   Vaping Use: Never used  Substance and Sexual Activity   Alcohol use: Yes    Alcohol/week: 14.0 standard drinks of alcohol    Types: 14 Glasses of wine per week    Comment: occasionally   Drug use: No   Sexual activity: Yes    Partners: Male    Birth control/protection: Post-menopausal    Comment: 1st intercourse- 64, partners- 4,

## 2023-02-18 NOTE — Patient Instructions (Signed)
-   [name]:  -Continue allopurinol 200 mg every day -You may take your meloxicam 15 mg only as needed for any pain or swelling

## 2023-03-27 IMAGING — DX DG ELBOW COMPLETE 3+V*L*
4 series · 4 of 4 positions shown · non-contrast
Comparison: None available

CLINICAL DATA: Pain and swelling with no known injury

EXAM:
LEFT ELBOW - COMPLETE 3+ VIEW

[elbow ap]
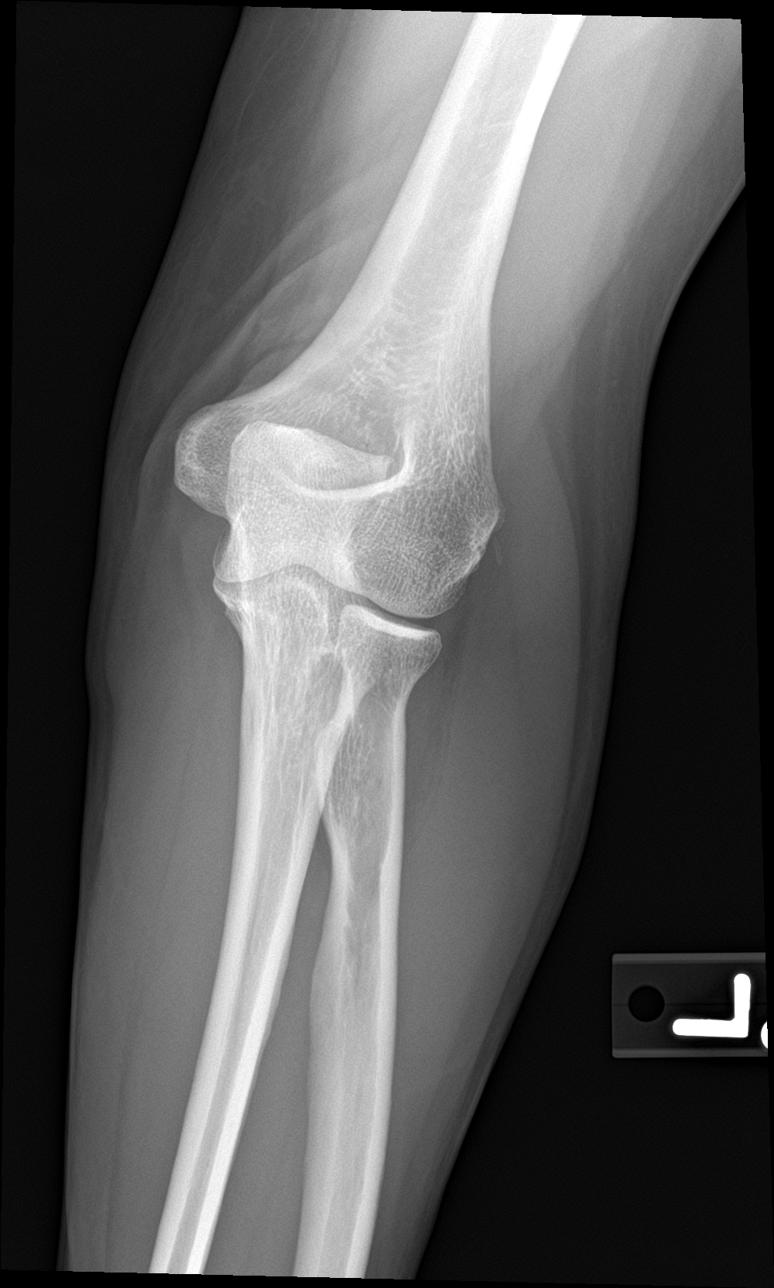

[elbow obl (1 of 2)]
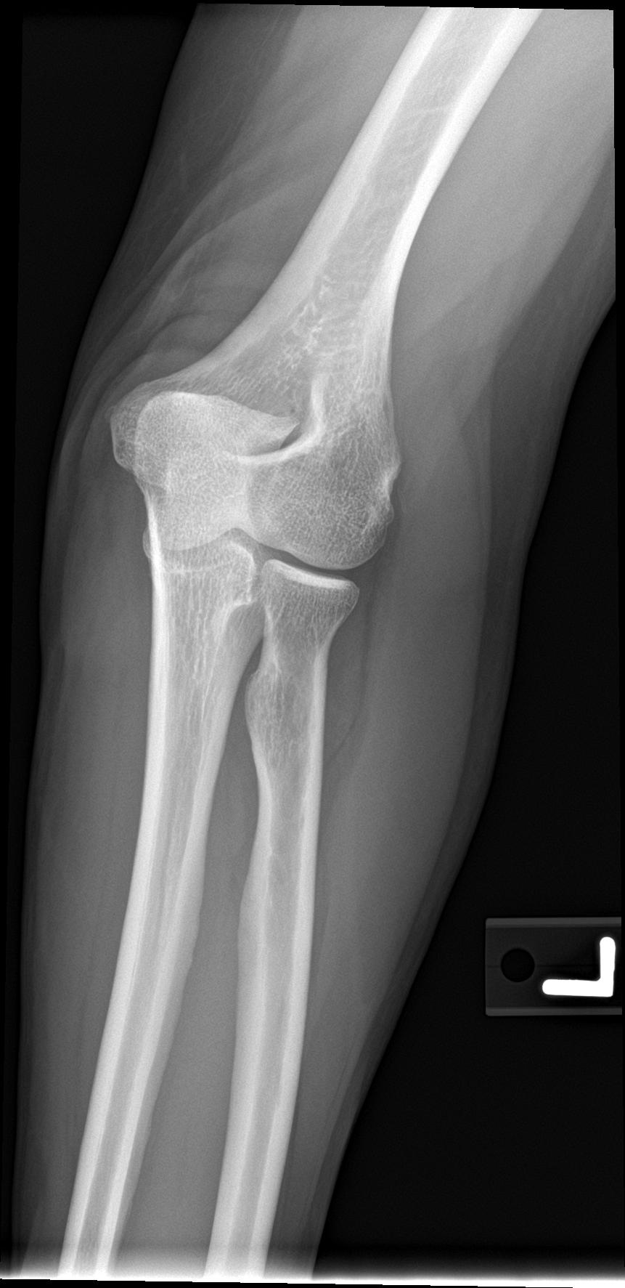

[elbow obl (2 of 2)]
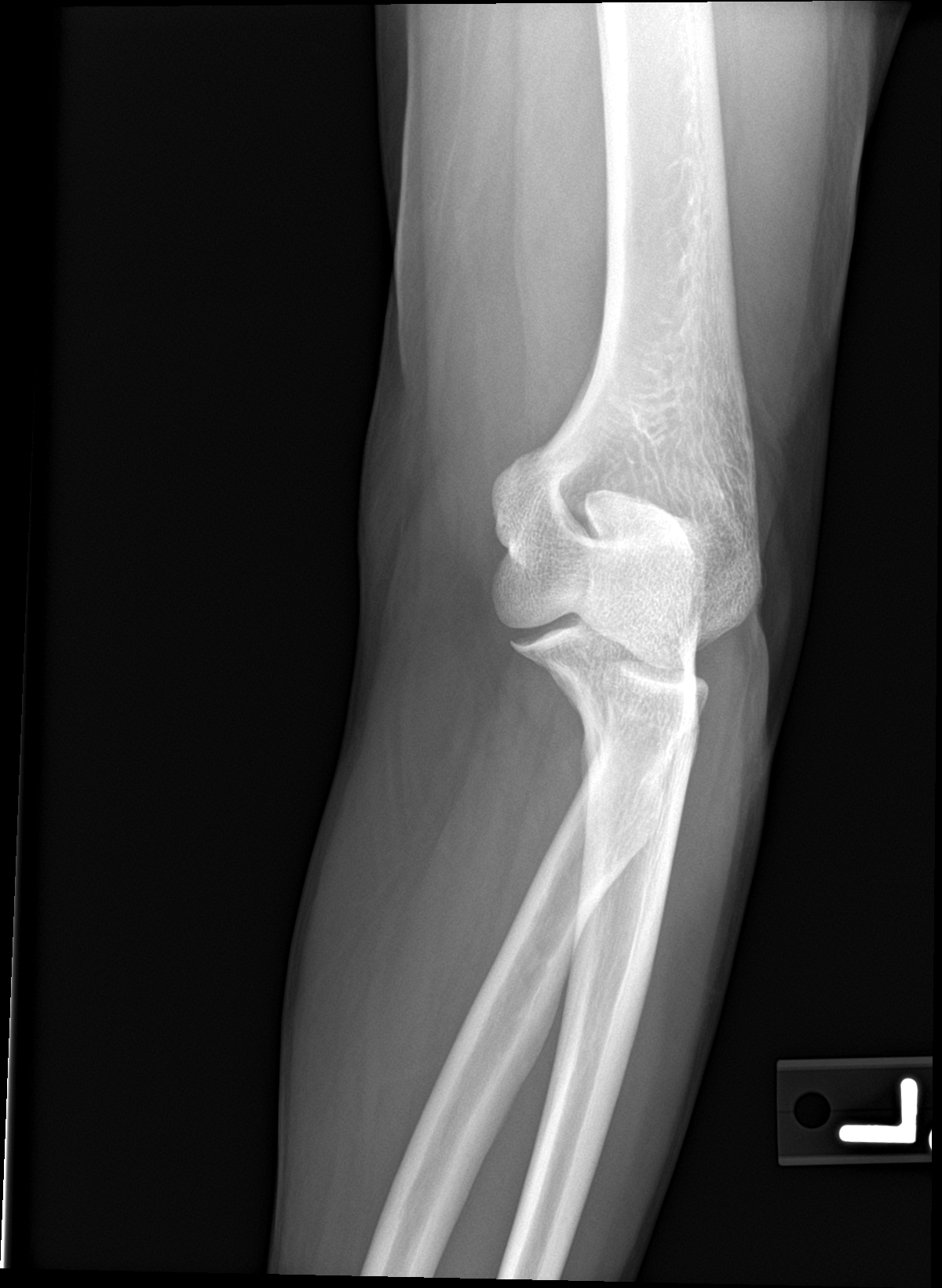

[elbow lat]
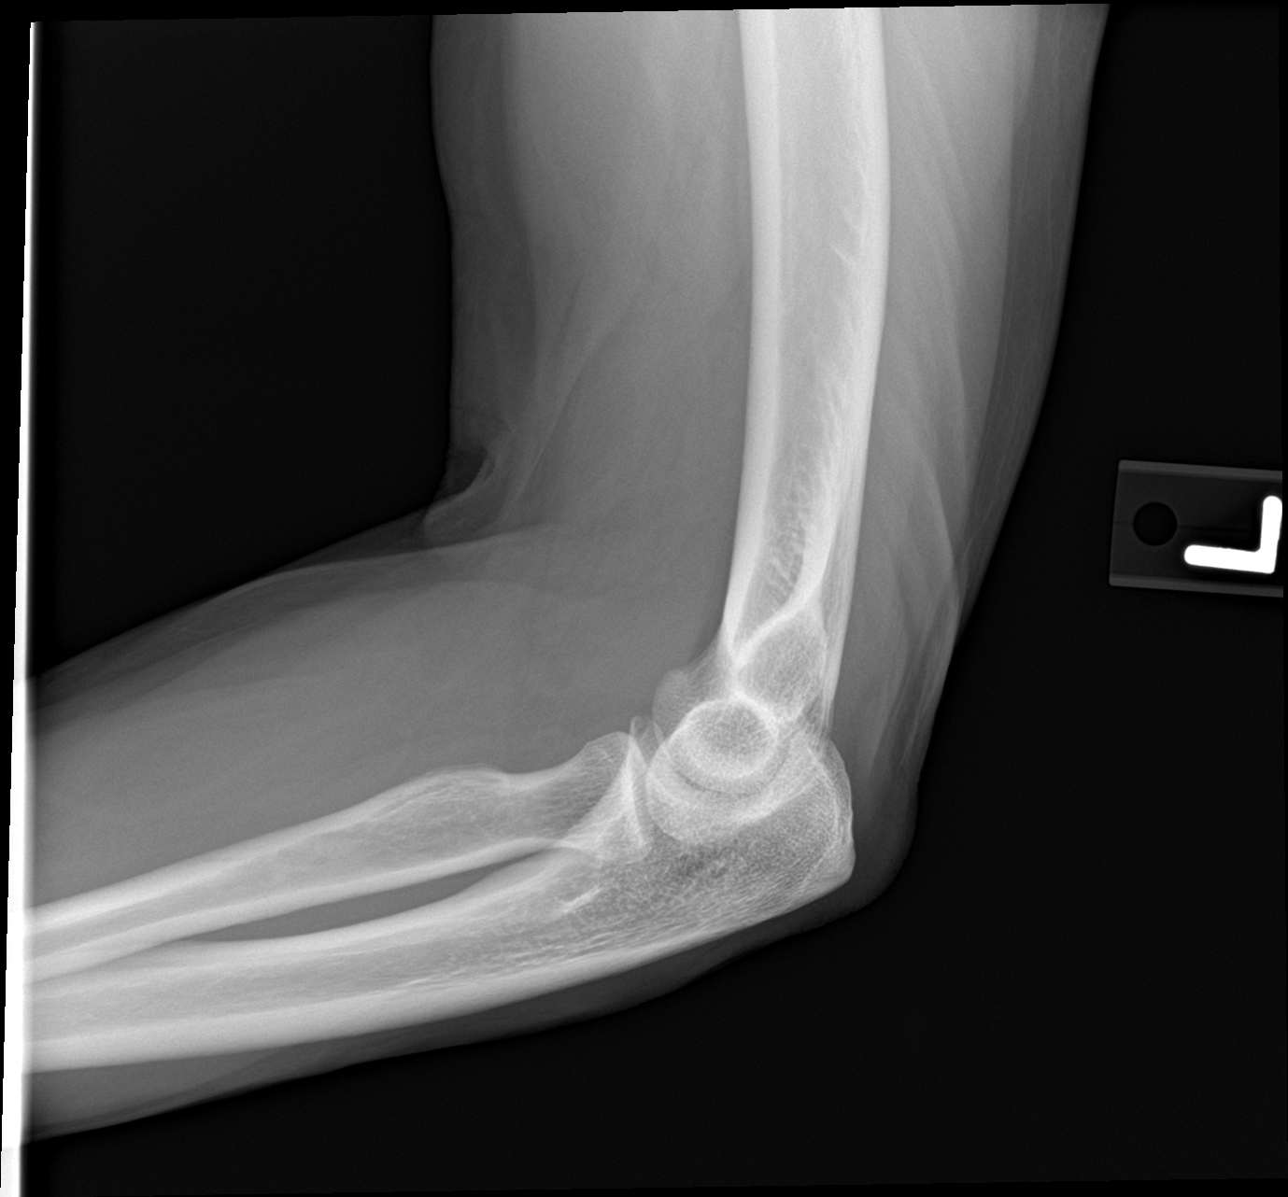

[4 of 4 positions shown; findings below may reference images not displayed]

FINDINGS: Slight irregularity of the medial and lateral humeral epicondyles
consistent with prior episodes of epicondylitis. No acute fracture
or dislocation. No significant joint effusion.
IMPRESSION: No acute abnormality of the left elbow.

## 2023-04-10 ENCOUNTER — Other Ambulatory Visit: Payer: Self-pay | Admitting: Internal Medicine

## 2023-04-10 DIAGNOSIS — I10 Essential (primary) hypertension: Secondary | ICD-10-CM

## 2023-06-04 ENCOUNTER — Ambulatory Visit (HOSPITAL_COMMUNITY)
Admission: EM | Admit: 2023-06-04 | Discharge: 2023-06-04 | Disposition: A | Discharge status: Home / Self Care | Payer: BC Managed Care – PPO | Attending: Emergency Medicine | Admitting: Emergency Medicine

## 2023-06-04 ENCOUNTER — Encounter (HOSPITAL_COMMUNITY): Payer: Self-pay

## 2023-06-04 DIAGNOSIS — J069 Acute upper respiratory infection, unspecified: Secondary | ICD-10-CM | POA: Insufficient documentation

## 2023-06-04 DIAGNOSIS — B9789 Other viral agents as the cause of diseases classified elsewhere: Secondary | ICD-10-CM | POA: Insufficient documentation

## 2023-06-04 DIAGNOSIS — J40 Bronchitis, not specified as acute or chronic: Secondary | ICD-10-CM | POA: Insufficient documentation

## 2023-06-04 DIAGNOSIS — R059 Cough, unspecified: Secondary | ICD-10-CM | POA: Insufficient documentation

## 2023-06-04 DIAGNOSIS — U071 COVID-19: Secondary | ICD-10-CM | POA: Diagnosis not present

## 2023-06-04 LAB — SARS CORONAVIRUS 2 (TAT 6-24 HRS): SARS Coronavirus 2: POSITIVE — AB

## 2023-06-04 MED ORDER — BENZONATATE 100 MG PO CAPS: 100.0000 mg | ORAL_CAPSULE | Freq: Three times a day (TID) | ORAL | 0 refills | Status: DC

## 2023-06-04 MED ORDER — PREDNISONE 20 MG PO TABS: 40.0000 mg | ORAL_TABLET | Freq: Every day | ORAL | 0 refills | Status: AC

## 2023-06-04 MED ORDER — ACETAMINOPHEN 500 MG PO TABS: 500.0000 mg | ORAL_TABLET | Freq: Four times a day (QID) | ORAL | 0 refills | Status: DC | PRN

## 2023-06-04 MED ORDER — IBUPROFEN 800 MG PO TABS: 800.0000 mg | ORAL_TABLET | Freq: Three times a day (TID) | ORAL | 0 refills | Status: DC

## 2023-06-04 NOTE — ED Triage Notes (Signed)
Patient here today with c/o cough, runny nose, chills, and congestion X 1 day. Patient has not taken anything for her symptoms. Coworkers has been sick. No recent travel.

## 2023-06-04 NOTE — ED Provider Notes (Signed)
[facility]    CSN: [id number] Arrival date & time: 06/04/23  0801      History   Chief Complaint Chief Complaint  Patient presents with   Cough    HPI BIBIANA Torres is a 62 y.o. female.   Patient presents to clinic for complaints of nonproductive cough, nasal congestion, hot and cold chills and feeling unwell since yesterday.  She has had recent exposure to sick coworkers.  She has not taken any medications for her symptoms since they really just started to get bad last night.  Her cough is nonproductive, she does not smoke, no history of any respiratory diseases like asthma or COPD.  She denies chest pain, shortness of breath or wheezing.  She did have 1 initial vaccine for COVID-19.  She did have some diarrhea on Sunday and she felt nauseated later that day, the symptoms have since resolved.    The history is provided by the patient and medical records.  Cough Associated symptoms: chills and rhinorrhea   Associated symptoms: no chest pain, no shortness of breath, no sore throat and no wheezing     Past Medical History:  Diagnosis Date   Arthritis    right hand   Chicken pox    Childhood   Diverticulosis of colon (without mention of hemorrhage)    GERD (gastroesophageal reflux disease)    Hiatal hernia    Hyperplastic colon polyp    Mass of finger of right hand 02/2013   right middle finger   Seasonal allergies    Sinus congestion 03/16/2013   cough, stuffy and runny nose   Urine incontinence    Vitamin D deficiency    Wears partial dentures    upper    Patient Active Problem List   Diagnosis Date Noted   Screen for colon cancer 12/12/2022   Idiopathic gout of multiple sites 11/05/2022   Effusion of right elbow 11/04/2022   OAB (overactive bladder) 11/22/2021   Stage 3a chronic kidney disease (HCC) 11/02/2020   Hyperplastic colonic polyp 06/29/2020   Cervical cancer screening 06/28/2020   Vitamin D deficiency disease 04/29/2019   Essential  hypertension 04/29/2019   Hyperlipidemia with target LDL less than 100 04/29/2019   Routine general medical examination at a health care facility 06/04/2016   Visit for screening mammogram 06/04/2016   Insomnia 08/17/2015   Rectal incontinence 08/17/2015   ESOPHAGITIS, REFLUX 08/14/2007    Past Surgical History:  Procedure Laterality Date   COLONOSCOPY  08/14/2007   562.10, 211.3   FOOT SURGERY Bilateral    exc. ingrown toenail great toe   TUBAL LIGATION  09/02/2002    OB History     Gravida  1   Para  1   Term  1   Preterm      AB      Living  1      SAB      IAB      Ectopic      Multiple      Live Births               Home Medications    Prior to Admission medications   Medication Sig Start Date End Date Taking? Authorizing Provider  acetaminophen (TYLENOL) 500 MG tablet Take 1 tablet (500 mg total) by mouth every 6 (six) hours as needed. 06/04/23  Yes Rinaldo Ratel, [place] N, FNP  amLODipine (NORVASC) 5 MG tablet TAKE 1 TABLET (5 MG TOTAL) BY MOUTH DAILY. 04/10/23  Yes Etta Grandchild, MD  benzonatate (TESSALON) 100 MG capsule Take 1 capsule (100 mg total) by mouth every 8 (eight) hours. 06/04/23  Yes Rinaldo Ratel, [place] N, FNP  ibuprofen (ADVIL) 800 MG tablet Take 1 tablet (800 mg total) by mouth 3 (three) times daily. 06/04/23  Yes Rinaldo Ratel, [place] N, FNP  predniSONE (DELTASONE) 20 MG tablet Take 2 tablets (40 mg total) by mouth daily for 5 days. 06/04/23 06/09/23 Yes Rinaldo Ratel, [place] N, FNP  Allopurinol 200 MG TABS Take 200 mg by mouth daily. 01/21/23 05/21/23  Madelyn Brunner, DO  colchicine 0.6 MG tablet Take 1 tablet (0.6 mg total) by mouth 2 (two) times daily for 21 days. 12/24/22 01/14/23  Madelyn Brunner, DO  fluticasone (FLONASE) 50 MCG/ACT nasal spray Place 1 spray into both nostrils daily. 06/24/22   Henson, Vickie L, NP-C  levocetirizine (XYZAL) 5 MG tablet Take 1 tablet (5 mg total) by mouth every evening. 06/24/22   Henson, Vickie L, NP-C  meloxicam (MOBIC) 15  MG tablet Take 1 tablet (15 mg total) by mouth daily. 01/03/23   Madelyn Brunner, DO  solifenacin (VESICARE) 10 MG tablet Take 1 tablet (10 mg total) by mouth daily. 12/10/22   Etta Grandchild, MD    Family History Family History  Problem Relation Age of Onset   Kidney disease Father    Heart disease Father    Stroke Father    Hypertension Father    Heart disease Mother    Depression Mother    Cancer Sister        lung   Breast cancer Sister        both breast removed   Colon cancer Neg Hx    Esophageal cancer Neg Hx     Social History Social History   Tobacco Use   Smoking status: Former   Smokeless tobacco: Never   Tobacco comments:    quit smoking 20 years ago  Vaping Use   Vaping status: Never Used  Substance Use Topics   Alcohol use: Yes    Alcohol/week: 14.0 standard drinks of alcohol    Types: 14 Glasses of wine per week    Comment: occasionally   Drug use: No     Allergies   Shellfish allergy   Review of Systems Review of Systems  Constitutional:  Positive for chills.  HENT:  Positive for congestion and rhinorrhea. Negative for sore throat.   Respiratory:  Positive for cough. Negative for shortness of breath and wheezing.   Cardiovascular:  Negative for chest pain.  Gastrointestinal:  Negative for abdominal pain, diarrhea, nausea and vomiting.     Physical Exam Triage Vital Signs ED Triage Vitals  Encounter Vitals Group     BP 06/04/23 0821 (!) 152/96     Systolic BP Percentile --      Diastolic BP Percentile --      Pulse Rate 06/04/23 0821 84     Resp 06/04/23 0821 16     Temp 06/04/23 0821 98.4 F (36.9 C)     Temp Source 06/04/23 0821 Oral     SpO2 06/04/23 0821 95 %     Weight --      Height 06/04/23 0822 5\' 1"  (1.549 m)     Head Circumference --      Peak Flow --      Pain Score 06/04/23 0824 0     Pain Loc --      Pain Education --      Exclude from  Growth Chart --    No data found.  Updated Vital Signs BP (!) 152/96 (BP  Location: Left Arm)   Pulse 84   Temp 98.4 F (36.9 C) (Oral)   Resp 16   Ht 5\' 1"  (1.549 m)   SpO2 95%   BMI 30.80 kg/m   Visual Acuity Right Eye Distance:   Left Eye Distance:   Bilateral Distance:    Right Eye Near:   Left Eye Near:    Bilateral Near:     Physical Exam Vitals and nursing note reviewed.  Constitutional:      Appearance: Normal appearance.  HENT:     Head: Normocephalic and atraumatic.     Right Ear: External ear normal.     Left Ear: External ear normal.     Nose: Congestion present.     Mouth/Throat:     Mouth: Mucous membranes are moist.  Eyes:     Conjunctiva/sclera: Conjunctivae normal.  Cardiovascular:     Rate and Rhythm: Normal rate and regular rhythm.     Heart sounds: Normal heart sounds. No murmur heard. Pulmonary:     Effort: Pulmonary effort is normal.     Breath sounds: Examination of the right-upper field reveals wheezing. Examination of the left-upper field reveals wheezing. Wheezing present.     Comments: Mild expiratory wheezing in upper lobes. Musculoskeletal:        General: Normal range of motion.     Cervical back: Normal range of motion.  Skin:    General: Skin is warm and dry.  Neurological:     General: No focal deficit present.     Mental Status: She is alert and oriented to person, place, and time.  Psychiatric:        Mood and Affect: Mood normal.        Behavior: Behavior normal.      UC Treatments / Results  Labs (all labs ordered are listed, but only abnormal results are displayed) Labs Reviewed  SARS CORONAVIRUS 2 (TAT 6-24 HRS)    EKG   Radiology No results found.  Procedures Procedures (including critical care time)  Medications Ordered in UC Medications - No data to display  Initial Impression / Assessment and Plan / UC Course  I have reviewed the triage vital signs and the nursing notes.  Pertinent labs & imaging results that were available during my care of the patient were reviewed by  me and considered in my medical decision making (see chart for details).  Vitals and triage reviewed, patient is hemodynamically stable.  Mild expiratory wheezing in upper lobes on auscultation.  Heart with RRR.  Suspect viral illness due to sudden onset and recent sick exposures, COVID-19 swab obtained.  Symptomatic management discussed, will give steroid burst for wheezing.  Plan of care, follow-up care and return precautions given, no questions at this time.  Work note provided.     Final Clinical Impressions(s) / UC Diagnoses   Final diagnoses:  Viral URI with cough  Bronchitis     Discharge Instructions      We have tested you for COVID-19 and our staff will contact you if positive.  For body aches, fever, chills you can alternate between 500 mg of Tylenol and 800 mg of ibuprofen every 4-6 hours.  Please start the steroids this morning, take these daily with breakfast to help with your wheezing.  Ensure you are getting plenty of rest, drinking at least 64 ounces of fluids, and take the cough  medicine as needed.  You can consider 1200 mg of Mucinex daily to help with your nasal congestion.  Please return to clinic if you develop wheezing, shortness of breath, high fever despite medication, or no improvement in your symptoms over the next 7 to 10 days.      ED Prescriptions     Medication Sig Dispense Auth. Provider   benzonatate (TESSALON) 100 MG capsule Take 1 capsule (100 mg total) by mouth every 8 (eight) hours. 21 capsule Rinaldo Ratel, [place] N, [place]   predniSONE (DELTASONE) 20 MG tablet Take 2 tablets (40 mg total) by mouth daily for 5 days. 10 tablet Rinaldo Ratel, [place] N, [place]   acetaminophen (TYLENOL) 500 MG tablet Take 1 tablet (500 mg total) by mouth every 6 (six) hours as needed. 30 tablet Rinaldo Ratel, [place] N, [place]   ibuprofen (ADVIL) 800 MG tablet Take 1 tablet (800 mg total) by mouth 3 (three) times daily. 21 tablet , [place] N, [place]      PDMP not reviewed this  encounter.   Rinaldo Ratel [place] N, [place] 06/04/23 [phone]

## 2023-06-04 NOTE — Discharge Instructions (Addendum)
We have tested you for COVID-19 and our staff will contact you if positive.  For body aches, fever, chills you can alternate between 500 mg of Tylenol and 800 mg of ibuprofen every 4-6 hours.  Please start the steroids this morning, take these daily with breakfast to help with your wheezing.  Ensure you are getting plenty of rest, drinking at least 64 ounces of fluids, and take the cough medicine as needed.  You can consider 1200 mg of Mucinex daily to help with your nasal congestion.  Please return to clinic if you develop wheezing, shortness of breath, high fever despite medication, or no improvement in your symptoms over the next 7 to 10 days.

## 2023-06-06 ENCOUNTER — Telehealth (HOSPITAL_COMMUNITY): Payer: Self-pay

## 2023-07-16 ENCOUNTER — Ambulatory Visit (INDEPENDENT_AMBULATORY_CARE_PROVIDER_SITE_OTHER): Payer: BC Managed Care – PPO | Admitting: Physician Assistant

## 2023-07-16 ENCOUNTER — Encounter: Payer: Self-pay | Admitting: Physician Assistant

## 2023-07-16 ENCOUNTER — Other Ambulatory Visit (INDEPENDENT_AMBULATORY_CARE_PROVIDER_SITE_OTHER): Payer: BC Managed Care – PPO

## 2023-07-16 DIAGNOSIS — M1009 Idiopathic gout, multiple sites: Secondary | ICD-10-CM

## 2023-07-16 DIAGNOSIS — M25561 Pain in right knee: Secondary | ICD-10-CM

## 2023-07-16 DIAGNOSIS — G8929 Other chronic pain: Secondary | ICD-10-CM

## 2023-07-16 NOTE — Progress Notes (Signed)
Office Visit Note   Patient: Marie Torres           Date of Birth: [birth year]           MRN: [id number] Visit Date: 07/16/2023              Requested by: Etta Grandchild, MD [address] [place],  [place] [address] PCP: Etta Grandchild, MD  No chief complaint on file.     HPI: Marie Torres is a pleasant 62 year old woman who is a patient of Dr. Shon Baton.  She has a history of gout.  She was last seen by Dr. Shon Baton earlier this year at which time she had an elevated uric acid.  She was placed on allopurinol.  She admits that she has not been taking the allopurinol.  She denies any fever chills or recent injury.  She did recover from COVID a few weeks ago.  She is of the pain began in her right knee after she went to a cookout over the weekend.  Knee is very painful to touch.  Denies any calf pain.  Assessment & Plan: Visit Diagnoses: Right knee pain  Plan: She does have some swelling in the right knee no significant effusion.  She has global tenderness in the knee though seems to be more tender laterally than medially.  Given she has had gout in this knee before certainly where I would for start.  She has not had any systemic symptoms but knows if she does have these to call me right away.  She does have colchicine at home and I have told her to start taking this not the allopurinol.  Will call her with the results of the uric acid  Follow-Up Instructions: No follow-ups on file.   Ortho Exam  Patient is alert, oriented, no adenopathy, well-dressed, normal affect, normal respiratory effort. Examination of her right knee she is neurovascular intact she does have swelling and mild erythema over the knee.  She is globally tender more laterally than medially.  Compartments are soft and nontender of the calf.  Negative Homans' sign.  Imaging: No results found. No images are attached to the encounter.  Labs: Lab Results  Component Value Date   HGBA1C 5.7 04/29/2019   ESRSEDRATE 61 (H)  10/21/2019   ESRSEDRATE 108 (H) 02/01/2019   ESRSEDRATE 58 (H) 12/18/2017   CRP 3.6 11/04/2022   LABURIC 5.1 02/18/2023   LABURIC 6.2 01/03/2023   LABURIC 7.1 (H) 11/04/2022   REPTSTATUS 04/25/2022 FINAL 04/23/2022   GRAMSTAIN Rare 03/18/2016   GRAMSTAIN WBC present-predominately PMN 03/18/2016   GRAMSTAIN No Squamous Epithelial Cells Seen 03/18/2016   GRAMSTAIN No Organisms Seen 03/18/2016   CULT >=100,000 COLONIES/mL ESCHERICHIA COLI (A) 04/23/2022   LABORGA ESCHERICHIA COLI (A) 04/23/2022     Lab Results  Component Value Date   ALBUMIN 4.0 11/21/2021   ALBUMIN 3.8 12/12/2020   ALBUMIN 4.2 11/01/2020    No results found for: "MG" Lab Results  Component Value Date   VD25OH 26.11 (L) 10/21/2019   VD25OH 19.37 (L) 04/29/2019   VD25OH 10 (L) 02/27/2017    No results found for: "PREALBUMIN"    Latest Ref Rng & Units 11/04/2022    2:04 PM 01/22/2022    3:09 PM 11/21/2021    2:32 PM  CBC EXTENDED  WBC 4.0 - 10.5 K/uL 9.5  7.4  8.3   RBC 3.87 - 5.11 Mil/uL 4.59  4.38  4.67   Hemoglobin 12.0 - 15.0 g/dL  13.6  12.8  13.5   HCT 36.0 - 46.0 % 41.0  37.4  40.4   Platelets 150.0 - 400.0 K/uL 317.0  309.0  288.0   NEUT# 1.4 - 7.7 K/uL 6.4  4.4  5.1   Lymph# 0.7 - 4.0 K/uL 2.0  2.2  2.0      There is no height or weight on file to calculate BMI.  Orders:  No orders of the defined types were placed in this encounter.  No orders of the defined types were placed in this encounter.    Procedures: No procedures performed  Clinical Data: No additional findings.  ROS:  All other systems negative, except as noted in the HPI. Review of Systems  All other systems reviewed and are negative.   Objective: Vital Signs: There were no vitals taken for this visit.  Specialty Comments:  No specialty comments available.  PMFS History: Patient Active Problem List   Diagnosis Date Noted   Screen for colon cancer 12/12/2022   Idiopathic gout of multiple sites 11/05/2022    Effusion of right elbow 11/04/2022   OAB (overactive bladder) 11/22/2021   Stage 3a chronic kidney disease (HCC) 11/02/2020   Hyperplastic colonic polyp 06/29/2020   Cervical cancer screening 06/28/2020   Vitamin D deficiency disease 04/29/2019   Essential hypertension 04/29/2019   Hyperlipidemia with target LDL less than 100 04/29/2019   Routine general medical examination at a health care facility 06/04/2016   Visit for screening mammogram 06/04/2016   Insomnia 08/17/2015   Rectal incontinence 08/17/2015   ESOPHAGITIS, REFLUX 08/14/2007   Past Medical History:  Diagnosis Date   Arthritis    right hand   Chicken pox    Childhood   Diverticulosis of colon (without mention of hemorrhage)    GERD (gastroesophageal reflux disease)    Hiatal hernia    Hyperplastic colon polyp    Mass of finger of right hand 02/2013   right middle finger   Seasonal allergies    Sinus congestion 03/16/2013   cough, stuffy and runny nose   Urine incontinence    Vitamin D deficiency    Wears partial dentures    upper    Family History  Problem Relation Age of Onset   Kidney disease Father    Heart disease Father    Stroke Father    Hypertension Father    Heart disease Mother    Depression Mother    Cancer Sister        lung   Breast cancer Sister        both breast removed   Colon cancer Neg Hx    Esophageal cancer Neg Hx     Past Surgical History:  Procedure Laterality Date   COLONOSCOPY  08/14/2007   562.10, 211.3   FOOT SURGERY Bilateral    exc. ingrown toenail great toe   TUBAL LIGATION  09/02/2002   Social History   Occupational History    Employer: [employer]  Tobacco Use   Smoking status: Former   Smokeless tobacco: Never   Tobacco comments:    quit smoking 20 years ago  Vaping Use   Vaping status: Never Used  Substance and Sexual Activity   Alcohol use: Yes    Alcohol/week: 14.0 standard drinks of alcohol    Types: 14 Glasses of wine per week    Comment: occasionally    Drug use: No   Sexual activity: Yes    Partners: Male    Birth control/protection: Post-menopausal  Comment: 1st intercourse- 61, partners- 4,

## 2023-07-17 LAB — URIC ACID: Uric Acid, Serum: 6.7 mg/dL (ref 2.5–7.0)

## 2023-07-17 LAB — EXTRA LAV TOP TUBE

## 2023-07-30 ENCOUNTER — Ambulatory Visit: Payer: BC Managed Care – PPO | Admitting: Sports Medicine

## 2023-07-30 ENCOUNTER — Encounter: Payer: Self-pay | Admitting: Sports Medicine

## 2023-07-30 DIAGNOSIS — M1A09X0 Idiopathic chronic gout, multiple sites, without tophus (tophi): Secondary | ICD-10-CM

## 2023-07-30 DIAGNOSIS — G8929 Other chronic pain: Secondary | ICD-10-CM | POA: Diagnosis not present

## 2023-07-30 DIAGNOSIS — M25561 Pain in right knee: Secondary | ICD-10-CM

## 2023-07-30 DIAGNOSIS — M25461 Effusion, right knee: Secondary | ICD-10-CM

## 2023-07-30 MED ORDER — ALLOPURINOL 100 MG PO TABS: 200.0000 mg | ORAL_TABLET | Freq: Every day | ORAL | 1 refills | Status: DC

## 2023-07-30 NOTE — Progress Notes (Signed)
Marie Torres - 62 y.o. female MRN [id number]  Date of birth: Sep 04, 1961  Office Visit Note: Visit Date: 07/30/2023 PCP: Etta Grandchild, MD Referred by: Etta Grandchild, MD  Subjective: Chief Complaint  Patient presents with   Right Knee - Follow-up, Pain   HPI: Marie Torres is a pleasant 62 y.o. female who presents today for follow-up of right knee.  Marie Torres has a history of gout, I presented mostly with effusions in the knee but also in the elbow.  2 weeks ago she was seen in our office for a flareup of knee pain and swelling.  She did begin back on either colchicine or meloxicam.  Had a uric acid level drawn which was 6.7, up from 5.1.  She does admit she has not been taking allopurinol.  Pertinent ROS were reviewed with the patient and found to be negative unless otherwise specified above in HPI.   Assessment & Plan: Visit Diagnoses:  1. Chronic pain of right knee   2. Pain and swelling of right knee   3. Idiopathic chronic gout of multiple sites without tophus    Plan: Discussed with Marie Torres the nature of her acute on chronic right knee pain, she did have more pain over the patella and may have had a degree of tendinitis, although I think her pain reoccurs because of her uncontrolled gout and elevated uric acid levels.  She had been doing well but stopped taking her allopurinol and review of her last uric acid shows an elevation of 6.7, from 5.1 previously.  She will restart her allopurinol 200 mg once daily, discussed with her she needs to take this daily.  Discussed lifestyle and dietary interventions, handout was provided see AVS.  She may use her meloxicam 15 mg once daily as this is feeling better, she may discontinue this and only use this or colchicine as needed.  Would not take both at the same time.  We will follow-up in about 3 months and repeat uric acid levels at that visit, she may call or return sooner if any issues arise.  Did provide information for knee compression sleeve  to help with support and reduce swelling.  Follow-up: Return in about 3 months (around 10/30/2023) for for R-knee, gout.   Meds & Orders:  Meds ordered this encounter  Medications   allopurinol (ZYLOPRIM) 100 MG tablet    Sig: Take 2 tablets (200 mg total) by mouth daily.    Dispense:  120 tablet    Refill:  1   No orders of the defined types were placed in this encounter.    Procedures: No procedures performed      Clinical History: No specialty comments available.  She reports that she has quit smoking. She has never used smokeless tobacco.  Recent Labs    01/03/23 1530 02/18/23 1541 07/16/23 1619  LABURIC 6.2 5.1 6.7    Objective:    Physical Exam  Gen: Well-appearing, in no acute distress; non-toxic CV:  Well-perfused. Warm.  Resp: Breathing unlabored on room air; no wheezing. Psych: Fluid speech in conversation; appropriate affect; normal thought process Neuro: Sensation intact throughout. No gross coordination deficits.   Ortho Exam - Right knee: There is no significant redness swelling or effusion in the knee.  Range of motion is preserved from 0-135 degrees.  No ligamental instability.  Imaging:  *Independent review and interpretation of 2 view right knee x-ray from 07/16/2023 was performed by myself today.  X-ray showed well-maintained joint spaces  without significant arthropathy.  There is a small effusion noted.  No acute fracture or otherwise bony abnormality noted.  Past Medical/Family/Surgical/Social History: Medications & Allergies reviewed per EMR, new medications updated. Patient Active Problem List   Diagnosis Date Noted   Screen for colon cancer 12/12/2022   Idiopathic gout of multiple sites 11/05/2022   Effusion of right elbow 11/04/2022   OAB (overactive bladder) 11/22/2021   Stage 3a chronic kidney disease (HCC) 11/02/2020   Hyperplastic colonic polyp 06/29/2020   Cervical cancer screening 06/28/2020   Vitamin D deficiency disease 04/29/2019    Essential hypertension 04/29/2019   Hyperlipidemia with target LDL less than 100 04/29/2019   Routine general medical examination at a health care facility 06/04/2016   Visit for screening mammogram 06/04/2016   Insomnia 08/17/2015   Rectal incontinence 08/17/2015   ESOPHAGITIS, REFLUX 08/14/2007   Past Medical History:  Diagnosis Date   Arthritis    right hand   Chicken pox    Childhood   Diverticulosis of colon (without mention of hemorrhage)    GERD (gastroesophageal reflux disease)    Hiatal hernia    Hyperplastic colon polyp    Mass of finger of right hand 02/2013   right middle finger   Seasonal allergies    Sinus congestion 03/16/2013   cough, stuffy and runny nose   Urine incontinence    Vitamin D deficiency    Wears partial dentures    upper   Family History  Problem Relation Age of Onset   Kidney disease Father    Heart disease Father    Stroke Father    Hypertension Father    Heart disease Mother    Depression Mother    Cancer Sister        lung   Breast cancer Sister        both breast removed   Colon cancer Neg Hx    Esophageal cancer Neg Hx    Past Surgical History:  Procedure Laterality Date   COLONOSCOPY  08/14/2007   562.10, 211.3   FOOT SURGERY Bilateral    exc. ingrown toenail great toe   TUBAL LIGATION  09/02/2002   Social History   Occupational History    Employer: [employer]  Tobacco Use   Smoking status: Former   Smokeless tobacco: Never   Tobacco comments:    quit smoking 20 years ago  Vaping Use   Vaping status: Never Used  Substance and Sexual Activity   Alcohol use: Yes    Alcohol/week: 14.0 standard drinks of alcohol    Types: 14 Glasses of wine per week    Comment: occasionally   Drug use: No   Sexual activity: Yes    Partners: Male    Birth control/protection: Post-menopausal    Comment: 1st intercourse- 51, partners- 4,

## 2023-07-30 NOTE — Progress Notes (Signed)
Patient states that her knee has calmed down some in the last couple of weeks and is not painful anymore. She says that it will throb at times. Patient has not taken any medication in the last two weeks.

## 2023-09-11 ENCOUNTER — Other Ambulatory Visit: Payer: Self-pay | Admitting: Internal Medicine

## 2023-09-11 DIAGNOSIS — I10 Essential (primary) hypertension: Secondary | ICD-10-CM

## 2023-10-30 ENCOUNTER — Ambulatory Visit: Payer: BC Managed Care – PPO | Admitting: Sports Medicine

## 2023-10-30 ENCOUNTER — Encounter: Payer: Self-pay | Admitting: Sports Medicine

## 2023-10-30 DIAGNOSIS — G8929 Other chronic pain: Secondary | ICD-10-CM

## 2023-10-30 DIAGNOSIS — M25561 Pain in right knee: Secondary | ICD-10-CM

## 2023-10-30 DIAGNOSIS — M25572 Pain in left ankle and joints of left foot: Secondary | ICD-10-CM

## 2023-10-30 DIAGNOSIS — M1A09X0 Idiopathic chronic gout, multiple sites, without tophus (tophi): Secondary | ICD-10-CM | POA: Diagnosis not present

## 2023-10-30 MED ORDER — ALLOPURINOL 100 MG PO TABS: 200.0000 mg | ORAL_TABLET | Freq: Every day | ORAL | 1 refills | Status: DC

## 2023-10-30 MED ORDER — IBUPROFEN 800 MG PO TABS: 800.0000 mg | ORAL_TABLET | Freq: Three times a day (TID) | ORAL | 0 refills | Status: DC

## 2023-10-30 NOTE — Progress Notes (Signed)
 Patient says that her right knee has been good and has not had any flares or swelling since her last visit. She says that last week her right knee and left ankle were aching and seemed like a flare might happen, but then both went away. She says she took Meloxicam  two times when the aching started and both feel fine today.

## 2023-10-30 NOTE — Progress Notes (Signed)
 KESLEY Torres - 63 y.o. female MRN [id number]  Date of birth: Dec 30, 1960  Office Visit Note: Visit Date: 10/30/2023 PCP: Joshua Debby CROME, MD Referred by: Joshua Debby CROME, MD  Subjective: Chief Complaint  Patient presents with   Right Knee - Follow-up   HPI: Marie Torres is a pleasant 63 y.o. female who presents today for follow-up of right knee with history of gout.  Chronic right knee pain -this is doing well, she had an incident a few weeks ago when the weather was cooler and her knee flared up for a day or 2 but did not have any gross effusion in this and it went way on its own.  She did have some anterior left ankle pain around this time that self resolved in a day or 2 as well.  She has taken meloxicam  15 mg in the past but also done well with ibuprofen  800 mg just as needed.   Gout - multiple sites, most recent in the right knee back in the late summer time with flare and swelling.  We did restart her on allopurinol  200 mg once daily back on 07/30/2023.  She has been taking this for a while but does need a refill as she has been out of this for a period of time, unsure exactly how long.  She has not had any flares here recently since our last visit.  Pertinent ROS were reviewed with the patient and found to be negative unless otherwise specified above in HPI.   Assessment & Plan: Visit Diagnoses:  1. Chronic pain of right knee   2. Idiopathic chronic gout of multiple sites without tophus   3. Pain in left ankle and joints of left foot    Plan: Impression is chronic right knee pain with history of recurrent gout in multiple sites most recently the knee, but has had flares in the elbow and ankle in the past.  She had been tolerating allopurinol  200 mg, but does need a refill of this.  Discussed the importance of daily adherence to this medication for prophylactic purposes.  Her last uric acid was 6.7 back in September, we did draw a uric acid level for reevaluation today.   Discussed with pain her goal is to have a uric acid less than 6.  Discussed appropriate dietary modifications.  If she is not having any flares and her uric acid is controlled, she will follow-up with me in about 6 months.  She may use either meloxicam  15 mg for the ibuprofen  only as needed for pain or swelling.  She will call or return sooner if any issues arise.  I will notify/call her of her uric acid level once returns.  Follow-up: Return in about 6 months (around 04/28/2024) for R-knee pain and Gout f/u .   Meds & Orders:  Meds ordered this encounter  Medications   allopurinol  (ZYLOPRIM ) 100 MG tablet    Sig: Take 2 tablets (200 mg total) by mouth daily.    Dispense:  120 tablet    Refill:  1   ibuprofen  (ADVIL ) 800 MG tablet    Sig: Take 1 tablet (800 mg total) by mouth 3 (three) times daily.    Dispense:  21 tablet    Refill:  0    Orders Placed This Encounter  Procedures   Uric acid     Procedures: No procedures performed      Clinical History: No specialty comments available.  She reports that she has quit  smoking. She has never used smokeless tobacco.  Recent Labs    01/03/23 1530 02/18/23 1541 07/16/23 1619  LABURIC 6.2 5.1 6.7   *Reviewed last 3 uric acid levels with patient in the room today, see above  Objective:    Physical Exam  Gen: Well-appearing, in no acute distress; non-toxic CV: Well-perfused. Warm.  Resp: Breathing unlabored on room air; no wheezing. Psych: Fluid speech in conversation; appropriate affect; normal thought process  Ortho Exam - Right knee: There is no redness swelling or effusion of the knee.  There is good range of motion from 0-130 degrees compared to 0-135 degrees of the contralateral knee.  No varus or valgus instability.  Nonantalgic gait.  - Left ankle: No redness swelling or effusion.  Full range of motion.  No bony tenderness.  Imaging: No results found.  Past Medical/Family/Surgical/Social History: Medications &  Allergies reviewed per EMR, new medications updated. Patient Active Problem List   Diagnosis Date Noted   Screen for colon cancer 12/12/2022   Idiopathic gout of multiple sites 11/05/2022   Effusion of right elbow 11/04/2022   OAB (overactive bladder) 11/22/2021   Stage 3a chronic kidney disease (HCC) 11/02/2020   Hyperplastic colonic polyp 06/29/2020   Cervical cancer screening 06/28/2020   Vitamin D  deficiency disease 04/29/2019   Essential hypertension 04/29/2019   Hyperlipidemia with target LDL less than 100 04/29/2019   Routine general medical examination at a health care facility 06/04/2016   Visit for screening mammogram 06/04/2016   Insomnia 08/17/2015   Rectal incontinence 08/17/2015   ESOPHAGITIS, REFLUX 08/14/2007   Past Medical History:  Diagnosis Date   Arthritis    right hand   Chicken pox    Childhood   Diverticulosis of colon (without mention of hemorrhage)    GERD (gastroesophageal reflux disease)    Hiatal hernia    Hyperplastic colon polyp    Mass of finger of right hand 02/2013   right middle finger   Seasonal allergies    Sinus congestion 03/16/2013   cough, stuffy and runny nose   Urine incontinence    Vitamin D  deficiency    Wears partial dentures    upper   Family History  Problem Relation Age of Onset   Kidney disease Father    Heart disease Father    Stroke Father    Hypertension Father    Heart disease Mother    Depression Mother    Cancer Sister        lung   Breast cancer Sister        both breast removed   Colon cancer Neg Hx    Esophageal cancer Neg Hx    Past Surgical History:  Procedure Laterality Date   COLONOSCOPY  08/14/2007   562.10, 211.3   FOOT SURGERY Bilateral    exc. ingrown toenail great toe   TUBAL LIGATION  09/02/2002   Social History   Occupational History    Employer: [employer]  Tobacco Use   Smoking status: Former   Smokeless tobacco: Never   Tobacco comments:    quit smoking 20 years ago  Vaping Use    Vaping status: Never Used  Substance and Sexual Activity   Alcohol use: Yes    Alcohol/week: 14.0 standard drinks of alcohol    Types: 14 Glasses of wine per week    Comment: occasionally   Drug use: No   Sexual activity: Yes    Partners: Male    Birth control/protection: Post-menopausal  Comment: 1st intercourse- 9, partners- 4,

## 2023-10-31 LAB — URIC ACID: Uric Acid, Serum: 8.6 mg/dL — ABNORMAL HIGH (ref 2.5–7.0)

## 2023-10-31 LAB — EXTRA LAV TOP TUBE

## 2023-12-20 ENCOUNTER — Encounter (HOSPITAL_COMMUNITY): Payer: Self-pay

## 2023-12-20 ENCOUNTER — Ambulatory Visit (HOSPITAL_COMMUNITY)
Admission: EM | Admit: 2023-12-20 | Discharge: 2023-12-20 | Disposition: A | Discharge status: Home / Self Care | Payer: BC Managed Care – PPO | Attending: Internal Medicine | Admitting: Internal Medicine

## 2023-12-20 DIAGNOSIS — J101 Influenza due to other identified influenza virus with other respiratory manifestations: Secondary | ICD-10-CM | POA: Diagnosis not present

## 2023-12-20 HISTORY — DX: Essential (primary) hypertension: I10

## 2023-12-20 LAB — POC COVID19/FLU A&B COMBO
Covid Antigen, POC: NEGATIVE
Influenza A Antigen, POC: POSITIVE — AB
Influenza B Antigen, POC: NEGATIVE

## 2023-12-20 MED ORDER — PROMETHAZINE-DM 6.25-15 MG/5ML PO SYRP: 5.0000 mL | ORAL_SOLUTION | Freq: Three times a day (TID) | ORAL | 0 refills | Status: DC | PRN

## 2023-12-20 MED ORDER — PREDNISONE 20 MG PO TABS: 40.0000 mg | ORAL_TABLET | Freq: Every day | ORAL | 0 refills | Status: AC

## 2023-12-20 MED ORDER — ALBUTEROL SULFATE HFA 108 (90 BASE) MCG/ACT IN AERS: 1.0000 | INHALATION_SPRAY | Freq: Four times a day (QID) | RESPIRATORY_TRACT | 0 refills | Status: DC | PRN

## 2023-12-20 MED ORDER — OSELTAMIVIR PHOSPHATE 75 MG PO CAPS: 75.0000 mg | ORAL_CAPSULE | Freq: Two times a day (BID) | ORAL | 0 refills | Status: DC

## 2023-12-20 NOTE — ED Provider Notes (Signed)
 [facility]    CSN: [id number] Arrival date & time: 12/20/23  1001      History   Chief Complaint Chief Complaint  Patient presents with   Generalized Body Aches   Headache   Chills   Cough   Diarrhea   Nasal Congestion    HPI SHERICA PATERNOSTRO is a 63 y.o. female.   63 year old female who presents urgent care with complaints of fevers, chills, cough, runny nose, congestion, diarrhea and bodyaches with headaches.  The symptoms started on Thursday but got much worse yesterday.  She has been using Flonase which has helped the congestion but not the other symptoms.  She is trying to stay hydrated but has no appetite.  She denies sore throat, ear pain, vomiting, abdominal pain.  She has not been around anyone with flu or COVID but knows that several coworkers have been sick recently.   Headache Associated symptoms: congestion, cough, diarrhea, fatigue and fever   Associated symptoms: no abdominal pain, no back pain, no ear pain, no eye pain, no seizures, no sore throat and no vomiting   Cough Associated symptoms: chills, fever, headaches and rhinorrhea   Associated symptoms: no chest pain, no ear pain, no rash, no shortness of breath and no sore throat   Diarrhea Associated symptoms: chills, fever and headaches   Associated symptoms: no abdominal pain, no arthralgias and no vomiting     Past Medical History:  Diagnosis Date   Arthritis    right hand   Chicken pox    Childhood   Diverticulosis of colon (without mention of hemorrhage)    GERD (gastroesophageal reflux disease)    Hiatal hernia    Hyperplastic colon polyp    Mass of finger of right hand 02/2013   right middle finger   Seasonal allergies    Sinus congestion 03/16/2013   cough, stuffy and runny nose   Urine incontinence    Vitamin D deficiency    Wears partial dentures    upper    Patient Active Problem List   Diagnosis Date Noted   Screen for colon cancer 12/12/2022   Idiopathic gout of  multiple sites 11/05/2022   Effusion of right elbow 11/04/2022   OAB (overactive bladder) 11/22/2021   Stage 3a chronic kidney disease (HCC) 11/02/2020   Hyperplastic colonic polyp 06/29/2020   Cervical cancer screening 06/28/2020   Vitamin D deficiency disease 04/29/2019   Essential hypertension 04/29/2019   Hyperlipidemia with target LDL less than 100 04/29/2019   Routine general medical examination at a health care facility 06/04/2016   Visit for screening mammogram 06/04/2016   Insomnia 08/17/2015   Rectal incontinence 08/17/2015   ESOPHAGITIS, REFLUX 08/14/2007    Past Surgical History:  Procedure Laterality Date   COLONOSCOPY  08/14/2007   562.10, 211.3   FOOT SURGERY Bilateral    exc. ingrown toenail great toe   TUBAL LIGATION  09/02/2002    OB History     Gravida  1   Para  1   Term  1   Preterm      AB      Living  1      SAB      IAB      Ectopic      Multiple      Live Births               Home Medications    Prior to Admission medications   Medication Sig Start Date End  Date Taking? Authorizing Provider  acetaminophen (TYLENOL) 500 MG tablet Take 1 tablet (500 mg total) by mouth every 6 (six) hours as needed. 06/04/23   Garrison, [place] N, FNP  allopurinol (ZYLOPRIM) 100 MG tablet Take 2 tablets (200 mg total) by mouth daily. 10/30/23 02/27/24  Madelyn Brunner, DO  amLODipine (NORVASC) 5 MG tablet TAKE 1 TABLET (5 MG TOTAL) BY MOUTH DAILY. 04/10/23   Etta Grandchild, MD  benzonatate (TESSALON) 100 MG capsule Take 1 capsule (100 mg total) by mouth every 8 (eight) hours. 06/04/23   Garrison, [place] N, FNP  colchicine 0.6 MG tablet Take 1 tablet (0.6 mg total) by mouth 2 (two) times daily for 21 days. 12/24/22 01/14/23  Madelyn Brunner, DO  fluticasone (FLONASE) 50 MCG/ACT nasal spray Place 1 spray into both nostrils daily. 06/24/22   Henson, Vickie L, NP-C  ibuprofen (ADVIL) 800 MG tablet Take 1 tablet (800 mg total) by mouth 3 (three) times daily. 10/30/23    Madelyn Brunner, DO  levocetirizine (XYZAL) 5 MG tablet Take 1 tablet (5 mg total) by mouth every evening. 06/24/22   Henson, Vickie L, NP-C  solifenacin (VESICARE) 10 MG tablet Take 1 tablet (10 mg total) by mouth daily. 12/10/22   Etta Grandchild, MD    Family History Family History  Problem Relation Age of Onset   Kidney disease Father    Heart disease Father    Stroke Father    Hypertension Father    Heart disease Mother    Depression Mother    Cancer Sister        lung   Breast cancer Sister        both breast removed   Colon cancer Neg Hx    Esophageal cancer Neg Hx     Social History Social History   Tobacco Use   Smoking status: Former   Smokeless tobacco: Never   Tobacco comments:    quit smoking 20 years ago  Vaping Use   Vaping status: Never Used  Substance Use Topics   Alcohol use: Yes    Alcohol/week: 14.0 standard drinks of alcohol    Types: 14 Glasses of wine per week    Comment: occasionally   Drug use: No     Allergies   Shellfish allergy   Review of Systems Review of Systems  Constitutional:  Positive for appetite change, chills, fatigue and fever.  HENT:  Positive for congestion and rhinorrhea. Negative for ear pain and sore throat.   Eyes:  Negative for pain and visual disturbance.  Respiratory:  Positive for cough. Negative for shortness of breath.   Cardiovascular:  Negative for chest pain and palpitations.  Gastrointestinal:  Positive for diarrhea. Negative for abdominal pain and vomiting.  Genitourinary:  Negative for dysuria and hematuria.  Musculoskeletal:  Negative for arthralgias and back pain.       Generalized body aches  Skin:  Negative for color change and rash.  Neurological:  Positive for headaches. Negative for seizures and syncope.  All other systems reviewed and are negative.    Physical Exam Triage Vital Signs ED Triage Vitals [12/20/23 1016]  Encounter Vitals Group     BP 136/82     Systolic BP Percentile       Diastolic BP Percentile      Pulse Rate (!) 103     Resp 16     Temp 98.4 F (36.9 C)     Temp Source Oral     SpO2 95 %  Weight      Height      Head Circumference      Peak Flow      Pain Score 8     Pain Loc      Pain Education      Exclude from Growth Chart    No data found.  Updated Vital Signs BP 136/82 (BP Location: Right Arm)   Pulse (!) 103   Temp 98.4 F (36.9 C) (Oral)   Resp 16   SpO2 95%   Visual Acuity Right Eye Distance:   Left Eye Distance:   Bilateral Distance:    Right Eye Near:   Left Eye Near:    Bilateral Near:     Physical Exam Vitals and nursing note reviewed.  Constitutional:      General: She is not in acute distress.    Appearance: She is well-developed.     Comments: Appears to not feel well  HENT:     Head: Normocephalic and atraumatic.     Right Ear: Tympanic membrane normal.     Left Ear: Tympanic membrane normal.     Nose: Congestion present.     Mouth/Throat:     Mouth: Mucous membranes are moist.  Eyes:     Conjunctiva/sclera: Conjunctivae normal.  Cardiovascular:     Rate and Rhythm: Normal rate and regular rhythm.     Heart sounds: No murmur heard. Pulmonary:     Effort: Pulmonary effort is normal. No respiratory distress.     Breath sounds: Normal breath sounds.  Abdominal:     Palpations: Abdomen is soft.     Tenderness: There is no abdominal tenderness.  Musculoskeletal:        General: No swelling.     Cervical back: Neck supple.  Skin:    General: Skin is warm and dry.     Capillary Refill: Capillary refill takes less than 2 seconds.  Neurological:     General: No focal deficit present.     Mental Status: She is alert and oriented to person, place, and time.  Psychiatric:        Mood and Affect: Mood normal.      UC Treatments / Results  Labs (all labs ordered are listed, but only abnormal results are displayed) Labs Reviewed  POC COVID19/FLU A&B COMBO    EKG   Radiology No results  found.  Procedures Procedures (including critical care time)  Medications Ordered in UC Medications - No data to display  Initial Impression / Assessment and Plan / UC Course  I have reviewed the triage vital signs and the nursing notes.  Pertinent labs & imaging results that were available during my care of the patient were reviewed by me and considered in my medical decision making (see chart for details).     No diagnosis found.   Flu A, flu B and COVID testing done today.  Flu a is positive.  This is a viral infection.  This does not require antibiotic treatment.  Reassuringly the patient's vital signs are good and her lung exam was clear bilateral.  We focus treatment on improving the symptoms.  We will treat with the following:  Tamiflu 75 mg twice daily for 5 days.  This medication may help reduce the number of days that you have flu symptoms.  Stop this medication if you feel that you are developing side effects such as worsening headache, nausea, vomiting. Promethazine DM 5 mL every 8 hours as needed for cough.  Use caution as this medication can cause drowsiness. Prednisone 40 mg (2 tablets) once daily for 5 days. Take this in the morning.  This is a steroid to help with inflammation and pain. Albuterol inhaler 1-2 puffs every 6 hours as needed for wheezing/shortness of breath. Rest and stay hydrated.  Drink plenty of fluids even if you do not feel like taking in solid foods Avoid returning to work until you are 24 hours without a fever without fever reducing medications. Return to urgent care or PCP if symptoms worsen or fail to resolve.    Final Clinical Impressions(s) / UC Diagnoses   Final diagnoses:  None   Discharge Instructions   None    ED Prescriptions   None    PDMP not reviewed this encounter.   Landis Martins, [place] 12/20/23 1053

## 2023-12-20 NOTE — Discharge Instructions (Addendum)
 Flu A, flu B and COVID testing done today.  Flu a is positive.  This is a viral infection.  This does not require antibiotic treatment.  Reassuring signs are that your oxygen levels are within normal limits and your lung exam was clear.  We focus treatment on improving the symptoms.  We will treat with the following:  Tamiflu 75 mg twice daily for 5 days.  This medication may help reduce the number of days that you have flu symptoms.  Stop this medication if you feel that you are developing side effects such as worsening headache, nausea, vomiting. Promethazine DM 5 mL every 8 hours as needed for cough.  Use caution as this medication can cause drowsiness. Prednisone 40 mg (2 tablets) once daily for 5 days. Take this in the morning.  This is a steroid to help with inflammation and pain. Albuterol inhaler 1-2 puffs every 6 hours as needed for wheezing/shortness of breath. Rest and stay hydrated.  Drink plenty of fluids even if you do not feel like taking in solid foods Avoid returning to work until you are 24 hours without a fever without fever reducing medications. Return to urgent care or PCP if symptoms worsen or fail to resolve.

## 2023-12-20 NOTE — ED Triage Notes (Signed)
 Patient c/o a chills, cough, body aches, Nasal congestion, and diarrhea x 2 days.  Patent states she has been using flonase

## 2024-01-29 ENCOUNTER — Ambulatory Visit (INDEPENDENT_AMBULATORY_CARE_PROVIDER_SITE_OTHER): Payer: BC Managed Care – PPO | Admitting: Sports Medicine

## 2024-01-29 ENCOUNTER — Encounter: Payer: Self-pay | Admitting: Sports Medicine

## 2024-01-29 DIAGNOSIS — G8929 Other chronic pain: Secondary | ICD-10-CM | POA: Diagnosis not present

## 2024-01-29 DIAGNOSIS — M1A09X0 Idiopathic chronic gout, multiple sites, without tophus (tophi): Secondary | ICD-10-CM

## 2024-01-29 DIAGNOSIS — M1712 Unilateral primary osteoarthritis, left knee: Secondary | ICD-10-CM

## 2024-01-29 DIAGNOSIS — M25561 Pain in right knee: Secondary | ICD-10-CM | POA: Diagnosis not present

## 2024-01-29 NOTE — Progress Notes (Signed)
 Marie Torres - 63 y.o. female MRN [id number]  Date of birth: 1961-04-11  Office Visit Note: Visit Date: 01/29/2024 PCP: Etta Grandchild, MD Referred by: Etta Grandchild, MD  Subjective: Chief Complaint  Patient presents with   Right Knee - Follow-up   HPI: Marie Torres is a pleasant 63 y.o. female who presents today for follow-up of chronic right knee pain with OA, f/u chronic gout.  [name] reports that she is doing well.  The knee continues to feel well and she has not had a reoccurrence of swelling or redness.  She does states she has been consistent with taking her allopurinol 200 mg daily.  She does have colchicine but she has not needed or required this.  She had a brief episode a few weeks ago where she took some over-the-counter anti-inflammatories and Voltaren gel which is helpful for her.  She is working on avoiding red meats and other foods that may exacerbate her gout.  Pertinent ROS were reviewed with the patient and found to be negative unless otherwise specified above in HPI.   Assessment & Plan: Visit Diagnoses:  1. Chronic pain of right knee   2. Unilateral primary osteoarthritis, left knee   3. Idiopathic chronic gout of multiple sites without tophus    Plan: Impression is chronic right knee pain with mild osteoarthritis and patellofemoral changes that is doing well.  She also has chronic gout and has had multiple flares both in the knees as well as the elbow, that she has been consistent with her allopurinol 200 mg daily she has had many months without any reoccurrence.  She has been adherent to allopurinol, she will continue this 200 mg daily for prevention.  She does have colchicine 0.6 mg which she may take for early onset of any flares, otherwise only as needed.  She will continue remaining active, walking and maintaining healthy weight.  She will see me back in about 3 months, we will repeat uric acid level at that time, consider BMP if not already performed around  that time with PCP.  Follow-up: Return in about 3 months (around 04/29/2024).   Meds & Orders: No orders of the defined types were placed in this encounter.  No orders of the defined types were placed in this encounter.   Procedures: No procedures performed      Clinical History: No specialty comments available.  She reports that she has quit smoking. She has never used smokeless tobacco.  Recent Labs    02/18/23 1541 07/16/23 1619 10/30/23 0817  LABURIC 5.1 6.7 8.6*    Objective:    Physical Exam  Gen: Well-appearing, in no acute distress; non-toxic CV: Well-perfused. Warm.  Resp: Breathing unlabored on room air; no wheezing. Psych: Fluid speech in conversation; appropriate affect; normal thought process  Ortho Exam - Right knee: There is no redness swelling or effusion about the knee.  Range of motion is relatively well-preserved from 0-130 degrees, compared to 0-135 degrees of the contralateral knee.  There is a mild degree of patellar crepitus, no varus or valgus instability.  Imaging: - n/a  Past Medical/Family/Surgical/Social History: Medications & Allergies reviewed per EMR, new medications updated. Patient Active Problem List   Diagnosis Date Noted   Screen for colon cancer 12/12/2022   Idiopathic gout of multiple sites 11/05/2022   Effusion of right elbow 11/04/2022   OAB (overactive bladder) 11/22/2021   Stage 3a chronic kidney disease (HCC) 11/02/2020   Hyperplastic colonic polyp 06/29/2020  Cervical cancer screening 06/28/2020   Vitamin D deficiency disease 04/29/2019   Essential hypertension 04/29/2019   Hyperlipidemia with target LDL less than 100 04/29/2019   Routine general medical examination at a health care facility 06/04/2016   Visit for screening mammogram 06/04/2016   Insomnia 08/17/2015   Rectal incontinence 08/17/2015   ESOPHAGITIS, REFLUX 08/14/2007   Past Medical History:  Diagnosis Date   Arthritis    right hand   Chicken pox     Childhood   Diverticulosis of colon (without mention of hemorrhage)    GERD (gastroesophageal reflux disease)    Hiatal hernia    Hyperplastic colon polyp    Hypertension    Mass of finger of right hand 02/2013   right middle finger   Seasonal allergies    Sinus congestion 03/16/2013   cough, stuffy and runny nose   Urine incontinence    Vitamin D deficiency    Wears partial dentures    upper   Family History  Problem Relation Age of Onset   Kidney disease Father    Heart disease Father    Stroke Father    Hypertension Father    Heart disease Mother    Depression Mother    Cancer Sister        lung   Breast cancer Sister        both breast removed   Colon cancer Neg Hx    Esophageal cancer Neg Hx    Past Surgical History:  Procedure Laterality Date   COLONOSCOPY  08/14/2007   562.10, 211.3   FOOT SURGERY Bilateral    exc. ingrown toenail great toe   TUBAL LIGATION  09/02/2002   Social History   Occupational History    Employer: [employer]  Tobacco Use   Smoking status: Former   Smokeless tobacco: Never   Tobacco comments:    quit smoking 20 years ago  Vaping Use   Vaping status: Never Used  Substance and Sexual Activity   Alcohol use: Yes    Alcohol/week: 14.0 standard drinks of alcohol    Types: 14 Glasses of wine per week    Comment: occasionally   Drug use: No   Sexual activity: Yes    Partners: Male    Birth control/protection: Post-menopausal    Comment: 1st intercourse- 43, partners- 4,

## 2024-01-29 NOTE — Progress Notes (Signed)
 Patient says that she is doing well. She has not had any gout flares. She says that she takes her medicine regularly, and has not had to take anything else, or use ice/heat, for the knee. She has had some aching in the knee recently, but says that only lasted about 1 day and she got good relief by using Voltaren gel.

## 2024-01-30 ENCOUNTER — Ambulatory Visit (HOSPITAL_COMMUNITY)
Admission: EM | Admit: 2024-01-30 | Discharge: 2024-01-30 | Disposition: A | Discharge status: Home / Self Care | Attending: Physician Assistant | Admitting: Physician Assistant

## 2024-01-30 ENCOUNTER — Ambulatory Visit (INDEPENDENT_AMBULATORY_CARE_PROVIDER_SITE_OTHER)

## 2024-01-30 ENCOUNTER — Other Ambulatory Visit: Payer: Self-pay

## 2024-01-30 ENCOUNTER — Encounter (HOSPITAL_COMMUNITY): Payer: Self-pay | Admitting: *Deleted

## 2024-01-30 DIAGNOSIS — W19XXXA Unspecified fall, initial encounter: Secondary | ICD-10-CM

## 2024-01-30 DIAGNOSIS — M545 Low back pain, unspecified: Secondary | ICD-10-CM | POA: Diagnosis not present

## 2024-01-30 NOTE — ED Triage Notes (Signed)
 Pt has Rt sided back pain. Pt reports falling one week ago.

## 2024-01-30 NOTE — Discharge Instructions (Addendum)
 Your x-ray did not show any signs of an acute fracture or bony abnormality in your low back.  At this time I suspect that your back pain may be caused by a muscular injury such as a strain.  Based on your symptoms and physical exam I believe the following is the cause of your concern today Back pain likely secondary to a strain of your back muscles  I recommend the following at this time to help relieve that discomfort:  Rest Warm compresses to the area (20 minutes on, minimum of 30 minutes off) You can alternate Tylenol and Ibuprofen for pain management but Ibuprofen is typically preferred to reduce inflammation.  Tylenol is preferred due to your kidney function Gentle stretches and exercises that I have included in your paperwork Try to reduce excess strain to the area and rest as much as possible  Wear supportive shoes and, if you must lift anything, use proper lifting techniques that spare your back.   If these measures do not lead to improvement in your symptoms over the next 2-4 weeks please let us know

## 2024-01-30 NOTE — ED Provider Notes (Signed)
 [facility]    CSN: [id number] Arrival date & time: 01/30/24  1324      History   Chief Complaint Chief Complaint  Patient presents with   Back Pain    HPI Marie Torres is a 63 y.o. female.   HPI  She reports her back has been hurting for about a week She states she first noticed it yesterday after it seemed to get worse   She states she fell but denies heavy lifting, or twisting injury  She reports she was trying to sit down and the chair moved so she came down hard on her buttocks and lower back - She reports she fell about 2-3 weeks ago  She reports her back hurts in the lower right area  Pain level and character: 6/10 achy in nature  Aggravating: walking makes it worse  Alleviating: none to her knowledge Interventions: nothing    Past Medical History:  Diagnosis Date   Arthritis    right hand   Chicken pox    Childhood   Diverticulosis of colon (without mention of hemorrhage)    GERD (gastroesophageal reflux disease)    Hiatal hernia    Hyperplastic colon polyp    Hypertension    Mass of finger of right hand 02/2013   right middle finger   Seasonal allergies    Sinus congestion 03/16/2013   cough, stuffy and runny nose   Urine incontinence    Vitamin D deficiency    Wears partial dentures    upper    Patient Active Problem List   Diagnosis Date Noted   Screen for colon cancer 12/12/2022   Idiopathic gout of multiple sites 11/05/2022   Effusion of right elbow 11/04/2022   OAB (overactive bladder) 11/22/2021   Stage 3a chronic kidney disease (HCC) 11/02/2020   Hyperplastic colonic polyp 06/29/2020   Cervical cancer screening 06/28/2020   Vitamin D deficiency disease 04/29/2019   Essential hypertension 04/29/2019   Hyperlipidemia with target LDL less than 100 04/29/2019   Routine general medical examination at a health care facility 06/04/2016   Visit for screening mammogram 06/04/2016   Insomnia 08/17/2015   Rectal incontinence  08/17/2015   ESOPHAGITIS, REFLUX 08/14/2007    Past Surgical History:  Procedure Laterality Date   COLONOSCOPY  08/14/2007   562.10, 211.3   FOOT SURGERY Bilateral    exc. ingrown toenail great toe   TUBAL LIGATION  09/02/2002    OB History     Gravida  1   Para  1   Term  1   Preterm      AB      Living  1      SAB      IAB      Ectopic      Multiple      Live Births               Home Medications    Prior to Admission medications   Medication Sig Start Date End Date Taking? Authorizing Provider  allopurinol (ZYLOPRIM) 100 MG tablet Take 2 tablets (200 mg total) by mouth daily. 10/30/23 02/27/24 Yes Madelyn Brunner, DO  acetaminophen (TYLENOL) 500 MG tablet Take 1 tablet (500 mg total) by mouth every 6 (six) hours as needed. 06/04/23   Garrison, [place] N, FNP  albuterol (VENTOLIN HFA) 108 (90 Base) MCG/ACT inhaler Inhale 1-2 puffs into the lungs every 6 (six) hours as needed for wheezing or shortness of breath. 12/20/23  White, Elizabeth A, PA-C  amLODipine (NORVASC) 5 MG tablet TAKE 1 TABLET (5 MG TOTAL) BY MOUTH DAILY. 04/10/23   Etta Grandchild, MD  benzonatate (TESSALON) 100 MG capsule Take 1 capsule (100 mg total) by mouth every 8 (eight) hours. 06/04/23   Garrison, [place] N, FNP  colchicine 0.6 MG tablet Take 1 tablet (0.6 mg total) by mouth 2 (two) times daily for 21 days. 12/24/22 01/14/23  Madelyn Brunner, DO  fluticasone (FLONASE) 50 MCG/ACT nasal spray Place 1 spray into both nostrils daily. 06/24/22   Henson, Vickie L, NP-C  ibuprofen (ADVIL) 800 MG tablet Take 1 tablet (800 mg total) by mouth 3 (three) times daily. 10/30/23   Madelyn Brunner, DO  levocetirizine (XYZAL) 5 MG tablet Take 1 tablet (5 mg total) by mouth every evening. 06/24/22   Henson, Vickie L, NP-C  oseltamivir (TAMIFLU) 75 MG capsule Take 1 capsule (75 mg total) by mouth every 12 (twelve) hours. 12/20/23   Landis Martins, PA-C  promethazine-dextromethorphan (PROMETHAZINE-DM) 6.25-15 MG/5ML syrup  Take 5 mLs by mouth every 8 (eight) hours as needed for cough. 12/20/23   Doristine Mango A, PA-C  solifenacin (VESICARE) 10 MG tablet Take 1 tablet (10 mg total) by mouth daily. 12/10/22   Etta Grandchild, MD    Family History Family History  Problem Relation Age of Onset   Kidney disease Father    Heart disease Father    Stroke Father    Hypertension Father    Heart disease Mother    Depression Mother    Cancer Sister        lung   Breast cancer Sister        both breast removed   Colon cancer Neg Hx    Esophageal cancer Neg Hx     Social History Social History   Tobacco Use   Smoking status: Former   Smokeless tobacco: Never   Tobacco comments:    quit smoking 20 years ago  Vaping Use   Vaping status: Never Used  Substance Use Topics   Alcohol use: Yes    Alcohol/week: 14.0 standard drinks of alcohol    Types: 14 Glasses of wine per week    Comment: occasionally   Drug use: No     Allergies   Shellfish allergy   Review of Systems Review of Systems  Genitourinary:  Negative for dysuria.  Musculoskeletal:  Positive for back pain and myalgias.     Physical Exam Triage Vital Signs ED Triage Vitals  Encounter Vitals Group     BP 01/30/24 0821 127/79     Systolic BP Percentile --      Diastolic BP Percentile --      Pulse Rate 01/30/24 0821 79     Resp 01/30/24 0821 20     Temp 01/30/24 0821 (!) 97.5 F (36.4 C)     Temp src --      SpO2 01/30/24 0821 97 %     Weight --      Height --      Head Circumference --      Peak Flow --      Pain Score 01/30/24 0818 6     Pain Loc --      Pain Education --      Exclude from Growth Chart --    No data found.  Updated Vital Signs BP 127/79   Pulse 79   Temp (!) 97.5 F (36.4 C)   Resp 20  SpO2 97%   Visual Acuity Right Eye Distance:   Left Eye Distance:   Bilateral Distance:    Right Eye Near:   Left Eye Near:    Bilateral Near:     Physical Exam Vitals reviewed.  Constitutional:       General: She is awake.     Appearance: Normal appearance. She is well-developed and well-groomed.  HENT:     Head: Normocephalic and atraumatic.  Pulmonary:     Effort: Pulmonary effort is normal.  Musculoskeletal:     Comments: ROM findings Thoracic: Lateral flexion and lateral rotation are intact and symmetrical. She has some pain with lateral flexion to the right side  Lumbar: Extension, flexion are intact. She has pain with flexion but is able to bend  Hips: Extension, flexion, abduction, adduction are symmetrical and intact. She voices pain with hip flexion and extension.  She has positive straight leg raise on the right side    Neurological:     General: No focal deficit present.     Mental Status: She is alert and oriented to person, place, and time.     GCS: GCS eye subscore is 4. GCS verbal subscore is 5. GCS motor subscore is 6.  Psychiatric:        Mood and Affect: Mood normal.        Behavior: Behavior normal. Behavior is cooperative.        Thought Content: Thought content normal.        Judgment: Judgment normal.      UC Treatments / Results  Labs (all labs ordered are listed, but only abnormal results are displayed) Labs Reviewed - No data to display  EKG   Radiology DG Lumbar Spine Complete Result Date: 01/30/2024 CLINICAL DATA:  Right-sided back pain. EXAM: LUMBAR SPINE - COMPLETE 4+ VIEW COMPARISON:  None Available. FINDINGS: No evidence for an acute fracture. Mild loss of disc height noted L4-5. No worrisome lytic or sclerotic osseous abnormality. SI joints are unremarkable. IMPRESSION: Mild loss of disc height at L4-5. No acute bony findings. Electronically Signed   By: Kennith Center M.D.   On: 01/30/2024 09:32    Procedures Procedures (including critical care time)  Medications Ordered in UC Medications - No data to display  Initial Impression / Assessment and Plan / UC Course  I have reviewed the triage vital signs and the nursing  notes.  Pertinent labs & imaging results that were available during my care of the patient were reviewed by me and considered in my medical decision making (see chart for details).      Final Clinical Impressions(s) / UC Diagnoses   Final diagnoses:  Fall, initial encounter  Acute right-sided low back pain without sciatica   Patient presents today with concerns for acute right sided back pain that started earlier this week.  She reports that she did fall about 2 weeks ago but denies hitting her head or loss of consciousness.  Physical exam is notable for mildly decreased range of motion and tenderness with certain motions.  X-ray did not show any signs of acute fracture, dislocation.  There was mild disc height loss at L4-5.  At this time recommend conservative measures to assist with pain management, comprised of rest, warm compresses, alternating Tylenol and ibuprofen as needed for pain management.  Also recommend gentle stretches and massage as tolerated.  Follow-up as needed for progressing or persistent symptoms    Discharge Instructions      Your x-ray did  not show any signs of an acute fracture or bony abnormality in your low back.  At this time I suspect that your back pain may be caused by a muscular injury such as a strain.  Based on your symptoms and physical exam I believe the following is the cause of your concern today Back pain likely secondary to a strain of your back muscles  I recommend the following at this time to help relieve that discomfort:  Rest Warm compresses to the area (20 minutes on, minimum of 30 minutes off) You can alternate Tylenol and Ibuprofen for pain management but Ibuprofen is typically preferred to reduce inflammation.  Tylenol is preferred due to your kidney function Gentle stretches and exercises that I have included in your paperwork Try to reduce excess strain to the area and rest as much as possible  Wear supportive shoes and, if you must  lift anything, use proper lifting techniques that spare your back.   If these measures do not lead to improvement in your symptoms over the next 2-4 weeks please let us know        ED Prescriptions   None    PDMP not reviewed this encounter.   Providence Crosby, PA-C 01/30/24 2057

## 2024-03-10 ENCOUNTER — Ambulatory Visit: Payer: Self-pay

## 2024-03-10 ENCOUNTER — Ambulatory Visit (HOSPITAL_COMMUNITY)
Admission: EM | Admit: 2024-03-10 | Discharge: 2024-03-10 | Disposition: A | Discharge status: Home / Self Care | Attending: Family Medicine | Admitting: Family Medicine

## 2024-03-10 ENCOUNTER — Other Ambulatory Visit: Payer: Self-pay

## 2024-03-10 ENCOUNTER — Encounter (HOSPITAL_COMMUNITY): Payer: Self-pay | Admitting: Emergency Medicine

## 2024-03-10 ENCOUNTER — Ambulatory Visit (INDEPENDENT_AMBULATORY_CARE_PROVIDER_SITE_OTHER)

## 2024-03-10 DIAGNOSIS — I10 Essential (primary) hypertension: Secondary | ICD-10-CM | POA: Diagnosis not present

## 2024-03-10 DIAGNOSIS — R079 Chest pain, unspecified: Secondary | ICD-10-CM

## 2024-03-10 DIAGNOSIS — M549 Dorsalgia, unspecified: Secondary | ICD-10-CM | POA: Diagnosis not present

## 2024-03-10 MED ORDER — KETOROLAC TROMETHAMINE 30 MG/ML IJ SOLN
INTRAMUSCULAR | Status: AC
  Filled 2024-03-10: qty 1

## 2024-03-10 MED ORDER — TIZANIDINE HCL 4 MG PO TABS: 4.0000 mg | ORAL_TABLET | Freq: Four times a day (QID) | ORAL | 0 refills | Status: DC | PRN

## 2024-03-10 MED ORDER — KETOROLAC TROMETHAMINE 30 MG/ML IJ SOLN
15.0000 mg | Freq: Once | INTRAMUSCULAR | Status: AC
  Administered 2024-03-10: 15 mg via INTRAMUSCULAR

## 2024-03-10 NOTE — Discharge Instructions (Addendum)
 Meds ordered this encounter  Medications   ketorolac  (TORADOL ) 30 MG/ML injection 15 mg   tiZANidine (ZANAFLEX) 4 MG tablet    Sig: Take 1 tablet (4 mg total) by mouth every 6 (six) hours as needed for muscle spasms.    Dispense:  30 tablet    Refill:  0   HOME CARE INSTRUCTIONS: For many people, back pain returns. Since your type of back pain is rarely dangerous, it is often a condition that people can learn to manage on their own. Please remain active. It is stressful on the back to sit or stand in one place. Do not sit, drive, or stand in one place for more than 30 minutes at a time. Take short walks on level surfaces as soon as pain allows. Try to increase the length of time you walk each day. Do not stay in bed. Resting more than 1 or 2 days can delay your recovery. Do not avoid exercise or work. Your body is made to move. It is not dangerous to be active, even though your back may hurt. Your back will likely heal faster if you return to being active before your pain is gone. Over-the-counter medicines to reduce pain and inflammation are often the most helpful.  SEEK MEDICAL CARE IF: You have pain that is not relieved with rest or medicine. You have pain that does not improve in 1 week. You have new symptoms. You are generally not feeling well.  SEEK IMMEDIATE MEDICAL CARE IF: You have pain that radiates from your back into your legs. You develop new bowel or bladder control problems. You have unusual weakness or numbness in your arms or legs. You develop nausea or vomiting. You develop abdominal pain. You feel faint.  Your blood pressure was noted to be elevated during your visit today. If you are currently taking medication for high blood pressure, please ensure you are taking this as directed. If you do not have a history of high blood pressure and your blood pressure remains persistently elevated, you may need to begin taking a medication at some point. You may return here within the  next few days to recheck if unable to see your primary care provider or if you do not have a one.  BP (!) 176/86 (BP Location: Right Arm) Comment: has not had blood pressure medicine  Pulse 87   Temp 98.2 F (36.8 C) (Oral)   Resp 18   SpO2 96%   BP Readings from Last 3 Encounters:  03/10/24 (!) 176/86  01/30/24 127/79  12/20/23 136/82

## 2024-03-10 NOTE — ED Triage Notes (Signed)
 Left chest soreness , increases with movement and radiates into left arm  Sunday was not feeling well, patient reports belching frequently.    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  Complains of back pain.  Denies sob

## 2024-03-10 NOTE — Telephone Encounter (Signed)
  Chief Complaint: back Pain, shoulder pain Symptoms: Pain to right lower back, left shoulder Frequency: started Sunday Pertinent Negatives: Patient denies CP, SOB Disposition: [] ED /[] Urgent Care (no appt availability in office) / [x] Appointment(In office/virtual)/ []  [facility]/ [] Home Care/ [] Refused Recommended Disposition /[] [facility]/ []  Follow-up with PCP Additional Notes: patient calling with left shoulder pain and right lower back pain that started on Sunday. Patient endorses pain level of 7 out of 10. Patient is unsure where the pain originated from. Per protocol, patient is recommended to be seen within three days. Patient scheduled to see another provider in office on 03/12/2024 at 1:40 PM. All questions answered and patient verbalized understanding.    Copied from CRM [id number]. Topic: Clinical - Red Word Triage >> Mar 10, 2024 12:03 PM Juleen Oakland F wrote: Red Word that prompted transfer to Nurse Triage: Patient having shoulder and back pain since Sunday Reason for Disposition  [1] MODERATE pain (e.g., interferes with normal activities) AND [2] present > 3 days  [1] MODERATE back pain (e.g., interferes with normal activities) AND [2] present > 3 days  Answer Assessment - Initial Assessment Questions 1. ONSET: "When did the pain begin?"      Started Sunday 2. LOCATION: "Where does it hurt?" (upper, mid or lower back)     Right lower back pain 3. SEVERITY: "How bad is the pain?"  (e.g., Scale 1-10; mild, moderate, or severe)   - MILD (1-3): Doesn't interfere with normal activities.    - MODERATE (4-7): Interferes with normal activities or awakens from sleep.    - SEVERE (8-10): Excruciating pain, unable to do any normal activities.      7 4. PATTERN: "Is the pain constant?" (e.g., yes, no; constant, intermittent)      intermittent 5. RADIATION: "Does the pain shoot into your legs or somewhere else?"     no 6. CAUSE:  "What do you think is causing the back  pain?"      unsure 7. BACK OVERUSE:  "Any recent lifting of heavy objects, strenuous work or exercise?"     no 8. MEDICINES: "What have you taken so far for the pain?" (e.g., nothing, acetaminophen , NSAIDS)     ibuprofen  9. NEUROLOGIC SYMPTOMS: "Do you have any weakness, numbness, or problems with bowel/bladder control?"     no 10. OTHER SYMPTOMS: "Do you have any other symptoms?" (e.g., fever, abdomen pain, burning with urination, blood in urine)       no  Answer Assessment - Initial Assessment Questions 1. ONSET: "When did the pain start?"     Sunday 2. LOCATION: "Where is the pain located?"     Left shoulder 3. PAIN: "How bad is the pain?" (Scale 1-10; or mild, moderate, severe)   - MILD (1-3): doesn't interfere with normal activities   - MODERATE (4-7): interferes with normal activities (e.g., work or school) or awakens from sleep   - SEVERE (8-10): excruciating pain, unable to do any normal activities, unable to move arm at all due to pain     moderate 4. WORK OR EXERCISE: "Has there been any recent work or exercise that involved this part of the body?"     no 5. CAUSE: "What do you think is causing the shoulder pain?"     unsure 6. OTHER SYMPTOMS: "Do you have any other symptoms?" (e.g., neck pain, swelling, rash, fever, numbness, weakness)     no  Protocols used: Back Pain-A-AH, Shoulder Pain-A-AH

## 2024-03-11 NOTE — Progress Notes (Signed)
 Acute Office Visit  Subjective:     Patient ID: Marie Torres, female    DOB: May 25, 1961, 63 y.o.   MRN: [id number]  Chief Complaint  Patient presents with   Acute Visit    L shoulder pain, present since mothers day. Back pain, has a history of sciatica. Went to [facility] on 05/14    HPI Patient is in today for evaluation of left chest, left back, left neck and left shoulder pain. Reports that she works 2 jobs and overworked herself on Sunday last week. Was seen in urgent care with negative chest x-ray.  Was prescribed tizanidine , states she did not take it after reading side effects. Has been using heat to the area with some relief. Reports that overall she seems to be improving, would like x-ray of the shoulder to be sure that nothing is dislocated and continuing to cause her pain. Denies erythema, bruising, obvious deformity, known injury.  ROS Per HPI      Objective:    BP 130/80 (BP Location: Left Arm, Patient Position: Sitting)   Pulse 76   Temp 98.7 F (37.1 C) (Temporal)   Ht 5\' 1"  (1.549 m)   Wt 165 lb 12.8 oz (75.2 kg)   SpO2 94%   BMI 31.33 kg/m    Physical Exam Vitals and nursing note reviewed.  Constitutional:      General: She is not in acute distress.    Appearance: Normal appearance. She is obese.  HENT:     Head: Normocephalic and atraumatic.     Right Ear: External ear normal.     Left Ear: External ear normal.     Nose: Nose normal.  Eyes:     Extraocular Movements: Extraocular movements intact.  Cardiovascular:     Rate and Rhythm: Normal rate and regular rhythm.     Pulses: Normal pulses.     Heart sounds: Normal heart sounds.  Pulmonary:     Effort: Pulmonary effort is normal. No respiratory distress.     Breath sounds: Normal breath sounds. No wheezing, rhonchi or rales.  Musculoskeletal:        General: Swelling and tenderness present.       Arms:     Cervical back: Normal range of motion.     Right lower leg: No edema.     Left  lower leg: No edema.     Comments: Areas of mild swelling, tenderness, muscles and spasm.  No erythema, no heat to the area, limited ROM noted, no bruising, no obvious deformity  Lymphadenopathy:     Cervical: No cervical adenopathy.  Neurological:     General: No focal deficit present.     Mental Status: She is alert and oriented to person, place, and time.  Psychiatric:        Mood and Affect: Mood normal.        Thought Content: Thought content normal.    No results found for any visits on 03/12/24.      Assessment & Plan:   Primary osteoarthritis of left shoulder Assessment & Plan: Meloxicam  15 mg once daily to pharmacy Will consider PT if not helpful   Acute pain of left shoulder Assessment & Plan: Plain films ordered today Meloxicam  to pharmacy Shoulder stretches given Will consider sports med to PT if this is not helpful   Orders: -     DG Shoulder Left; Future -     Meloxicam ; Take 1 tablet (15 mg total) by mouth daily.  Dispense: 30 tablet; Refill: 0     Meds ordered this encounter  Medications   meloxicam  (MOBIC ) 15 MG tablet    Sig: Take 1 tablet (15 mg total) by mouth daily.    Dispense:  30 tablet    Refill:  0    Return if symptoms worsen or fail to improve.  Wellington Half, FNP

## 2024-03-12 ENCOUNTER — Ambulatory Visit: Admitting: Family Medicine

## 2024-03-12 ENCOUNTER — Ambulatory Visit

## 2024-03-12 ENCOUNTER — Encounter: Payer: Self-pay | Admitting: Family Medicine

## 2024-03-12 VITALS — BP 130/80 | HR 76 | Temp 98.7°F | Ht 61.0 in | Wt 165.8 lb

## 2024-03-12 DIAGNOSIS — M25512 Pain in left shoulder: Secondary | ICD-10-CM

## 2024-03-12 DIAGNOSIS — M19012 Primary osteoarthritis, left shoulder: Secondary | ICD-10-CM

## 2024-03-12 MED ORDER — MELOXICAM 15 MG PO TABS: 15.0000 mg | ORAL_TABLET | Freq: Every day | ORAL | 0 refills | Status: DC

## 2024-03-12 NOTE — Patient Instructions (Addendum)
 We are getting an xray today. We will be in contact with any abnormal results that require further attention.  May use heat or ice to the area for relief as needed.  May use topical rubs or gels to the area as needed for relief.  Attached subtle stretching exercises to help relieve pain and strengthen muscles.   Follow-up with me for new or worsening symptoms.

## 2024-03-13 ENCOUNTER — Encounter: Payer: Self-pay | Admitting: Family Medicine

## 2024-03-13 ENCOUNTER — Ambulatory Visit: Payer: Self-pay | Admitting: Family Medicine

## 2024-03-13 DIAGNOSIS — M19012 Primary osteoarthritis, left shoulder: Secondary | ICD-10-CM | POA: Insufficient documentation

## 2024-03-13 DIAGNOSIS — M25512 Pain in left shoulder: Secondary | ICD-10-CM | POA: Insufficient documentation

## 2024-03-13 NOTE — Assessment & Plan Note (Signed)
 Meloxicam  15 mg once daily to pharmacy Will consider PT if not helpful

## 2024-03-13 NOTE — Assessment & Plan Note (Signed)
 Plain films ordered today Meloxicam  to pharmacy Shoulder stretches given Will consider sports med to PT if this is not helpful

## 2024-03-13 NOTE — ED Provider Notes (Signed)
 [facility]   [id number] 03/10/24 Arrival Time: 1739  ASSESSMENT & PLAN:  1. Chest pain, unspecified type   2. Upper back pain on left side   3. Elevated blood pressure reading in office with diagnosis of hypertension    Patient history and exam consistent with non-cardiac cause of chest pain.  ECG: Performed today and interpreted by me: normal EKG, normal sinus rhythm. No STEMI.  I have personally viewed the imaging studies ordered this visit. CXR: no acute changes; no pneumothorax.  Likely MSK. Discussed. Trial of: Meds ordered this encounter  Medications   ketorolac  (TORADOL ) 30 MG/ML injection 15 mg   tiZANidine  (ZANAFLEX ) 4 MG tablet    Sig: Take 1 tablet (4 mg total) by mouth every 6 (six) hours as needed for muscle spasms.    Dispense:  30 tablet    Refill:  0     Discharge Instructions       Meds ordered this encounter  Medications   ketorolac  (TORADOL ) 30 MG/ML injection 15 mg   tiZANidine  (ZANAFLEX ) 4 MG tablet    Sig: Take 1 tablet (4 mg total) by mouth every 6 (six) hours as needed for muscle spasms.    Dispense:  30 tablet    Refill:  0   HOME CARE INSTRUCTIONS: For many people, back pain returns. Since your type of back pain is rarely dangerous, it is often a condition that people can learn to manage on their own. Please remain active. It is stressful on the back to sit or stand in one place. Do not sit, drive, or stand in one place for more than 30 minutes at a time. Take short walks on level surfaces as soon as pain allows. Try to increase the length of time you walk each day. Do not stay in bed. Resting more than 1 or 2 days can delay your recovery. Do not avoid exercise or work. Your body is made to move. It is not dangerous to be active, even though your back may hurt. Your back will likely heal faster if you return to being active before your pain is gone. Over-the-counter medicines to reduce pain and inflammation are often the most  helpful.  SEEK MEDICAL CARE IF: You have pain that is not relieved with rest or medicine. You have pain that does not improve in 1 week. You have new symptoms. You are generally not feeling well.  SEEK IMMEDIATE MEDICAL CARE IF: You have pain that radiates from your back into your legs. You develop new bowel or bladder control problems. You have unusual weakness or numbness in your arms or legs. You develop nausea or vomiting. You develop abdominal pain. You feel faint.  Your blood pressure was noted to be elevated during your visit today. If you are currently taking medication for high blood pressure, please ensure you are taking this as directed. If you do not have a history of high blood pressure and your blood pressure remains persistently elevated, you may need to begin taking a medication at some point. You may return here within the next few days to recheck if unable to see your primary care provider or if you do not have a one.  BP (!) 176/86 (BP Location: Right Arm) Comment: has not had blood pressure medicine  Pulse 87   Temp 98.2 F (36.8 C) (Oral)   Resp 18   SpO2 96%   BP Readings from Last 3 Encounters:  03/10/24 (!) 176/86  01/30/24 127/79  12/20/23  136/82       Chest pain precautions given. Reviewed expectations re: course of current medical issues. Questions answered. Outlined signs and symptoms indicating need for more acute intervention. Patient verbalized understanding. After Visit Summary given.   SUBJECTIVE:  History from: patient. Marie Torres is a 63 y.o. female who presents with complaint of Left chest soreness , increases with movement and radiates into left arm  Sunday was not feeling well, patient reports belching frequently.    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  Complains of back pain.  Denies sob  Increased blood pressure noted today. Reports that she has been treated for hypertension  in the past.   Social History   Tobacco Use  Smoking Status Former  Smokeless Tobacco Never  Tobacco Comments   quit smoking 20 years ago   Social History   Substance and Sexual Activity  Alcohol Use Yes   Alcohol/week: 14.0 standard drinks of alcohol   Types: 14 Glasses of wine per week   Comment: occasionally     OBJECTIVE:  Vitals:   03/10/24 1747  BP: (!) 176/86  Pulse: 87  Resp: 18  Temp: 98.2 F (36.8 C)  TempSrc: Oral  SpO2: 96%    General appearance: alert, oriented, no acute distress Eyes: PERRLA; EOMI; conjunctivae normal HENT: normocephalic; atraumatic Neck: supple with FROM Lungs: without labored respirations; speaks full sentences without difficulty; CTAB Heart: regular rate and rhythm without murmer Chest Wall: with tenderness to palpation over LSB Abdomen: soft, non-tender; no guarding or rebound tenderness Extremities: without edema; without calf swelling or tenderness; symmetrical without gross deformities Skin: warm and dry; without rash or lesions Neuro: normal gait Psychological: alert and cooperative; normal mood and affect  Labs:  Labs Reviewed - No data to display  Imaging: No results found.   Allergies  Allergen Reactions   Shellfish Allergy Hives    Past Medical History:  Diagnosis Date   Arthritis    right hand   Chicken pox    Childhood   Diverticulosis of colon (without mention of hemorrhage)    GERD (gastroesophageal reflux disease)    Hiatal hernia    Hyperplastic colon polyp    Hypertension    Mass of finger of right hand 02/2013   right middle finger   Seasonal allergies    Sinus congestion 03/16/2013   cough, stuffy and runny nose   Urine incontinence    Vitamin D  deficiency    Wears partial dentures    upper   Social History   Socioeconomic History   Marital status: Single    Spouse name: Not on file   Number of children: 1   Years of education: Not on file   Highest education level: Not on file   Occupational History    Employer: [employer]  Tobacco Use   Smoking status: Former   Smokeless tobacco: Never   Tobacco comments:    quit smoking 20 years ago  Vaping Use   Vaping status: Never Used  Substance and Sexual Activity   Alcohol use: Yes    Alcohol/week: 14.0 standard drinks of alcohol    Types: 14 Glasses of wine per week    Comment: occasionally   Drug use: No   Sexual activity: Yes    Partners: Male    Birth control/protection: Post-menopausal    Comment: 1st intercourse- 17, partners- 4,   Other Topics Concern   Not on file  Social History Narrative  Not on file   Social Drivers of Health   Financial Resource Strain: Not on file  Food Insecurity: Not on file  Transportation Needs: Not on file  Physical Activity: Not on file  Stress: Not on file  Social Connections: Unknown (03/10/2022)   Received from [facility]   Social Network    Social Network: Not on file  Intimate Partner Violence: Unknown (01/30/2022)   Received from [facility], [facility]   HITS    Physically Hurt: Not on file    Insult or Talk Down To: Not on file    Threaten Physical Harm: Not on file    Scream or Curse: Not on file   Family History  Problem Relation Age of Onset   Kidney disease Father    Heart disease Father    Stroke Father    Hypertension Father    Heart disease Mother    Depression Mother    Cancer Sister        lung   Breast cancer Sister        both breast removed   Colon cancer Neg Hx    Esophageal cancer Neg Hx    Past Surgical History:  Procedure Laterality Date   COLONOSCOPY  08/14/2007   562.10, 211.3   FOOT SURGERY Bilateral    exc. ingrown toenail great toe   TUBAL LIGATION  09/02/2002      Afton Albright, MD 03/13/24 1806

## 2024-04-28 ENCOUNTER — Ambulatory Visit (INDEPENDENT_AMBULATORY_CARE_PROVIDER_SITE_OTHER): Payer: BC Managed Care – PPO | Admitting: Sports Medicine

## 2024-04-28 VITALS — Wt 161.6 lb

## 2024-04-28 DIAGNOSIS — M25561 Pain in right knee: Secondary | ICD-10-CM | POA: Diagnosis not present

## 2024-04-28 DIAGNOSIS — R634 Abnormal weight loss: Secondary | ICD-10-CM | POA: Diagnosis not present

## 2024-04-28 DIAGNOSIS — M1A09X0 Idiopathic chronic gout, multiple sites, without tophus (tophi): Secondary | ICD-10-CM | POA: Diagnosis not present

## 2024-04-28 NOTE — Progress Notes (Signed)
 Marie Torres - 63 y.o. female MRN [id number]  Date of birth: Dec 31, 1960  Office Visit Note: Visit Date: 04/28/2024 PCP: Joshua Debby CROME, MD Referred by: Joshua Debby CROME, MD  Subjective: Chief Complaint  Patient presents with   Right Knee - Follow-up, Pain    Patient returns for follow up right knee pain. She states that she is doing well. She has continued to take the allopurinol  as prescribed and denies needing the colchicine  yet. She is not taking anything for knee pain. She will take meloxicam  if having a really bad day. She notices the weather makes the pain a little worse.    HPI: Marie Torres is a pleasant 63 y.o. female who presents today for chronic right knee pain, also with gout follow-up.   Right knee -has some pain occasionally when it rains or when the weather flares but overall doing well.  She has patellofemoral arthritic change that is not advanced.  She has meloxicam  15 mg which she takes only a few times monthly, but not consistently.  Gout -since she has been managed on allopurinol  200 mg daily she has not had any gout flares.  She has been doing really well and has not had any gout flares since the end of 2024.  She has not needed colchicine .  Lab Results  Component Value Date   LABURIC 8.6 (H) 10/30/2023   Weight loss -Marie Torres does note that she has been losing weight unintentionally.  She is not changing her diet or increasing physical activity more than normal.  She was 165 pounds back on 03/12/2024, currently 161 pounds today.  She is unsure how much weight she has lost in the last year but does notice a difference.  She also has some stomach issues with reported diminished appetite.  Pertinent ROS were reviewed with the patient and found to be negative unless otherwise specified above in HPI.   Assessment & Plan: Visit Diagnoses:  1. Idiopathic chronic gout of multiple sites without tophus   2. Patellofemoral arthralgia of right knee   3. Unintentional weight  loss    Plan: Impression is chronic gout of multiple sites which has been well-controlled and she has not had a flare for the last 9+ months since she has been managed on allopurinol  200 mg daily.  She will continue this dose currently.  We will repeat uric acid as well as obtain a BMP to evaluate for kidney function.  Uric acid goal < 6.  Continue low purine based foods.  In terms of her patellofemoral arthritis, this is well-controlled.  She will continue her walking and regular activity as well as using her meloxicam  15 mg daily as needed.  She has noticed some unintentional weight loss, she has lost about 4-5 pounds over the last 6 weeks.  Also reporting diminished appetite and some stomach issues.  Recommended she follow-up with her primary care physician regarding this.  Follow-up: Return in about 6 months (around 10/29/2024) for R-knee and Gout f/u .   Meds & Orders: No orders of the defined types were placed in this encounter.   Orders Placed This Encounter  Procedures   Uric acid   Basic Metabolic Panel (BMET)     Procedures: No procedures performed      Clinical History: No specialty comments available.  She reports that she has quit smoking. She has never used smokeless tobacco.  Recent Labs    07/16/23 1619 10/30/23 0817  LABURIC 6.7 8.6*  Objective:   Vital Signs: Wt 161 lb 9.6 oz (73.3 kg)   BMI 30.53 kg/m   Physical Exam  Gen: Well-appearing, in no acute distress; non-toxic CV: Well-perfused. Warm.  Resp: Breathing unlabored on room air; no wheezing. Psych: Fluid speech in conversation; appropriate affect; normal thought process  Ortho Exam - Bilateral knee: There is no redness swelling or effusion of either knee.  There is patellar crepitus right > left knee.  Preserve range of motion from 0-135 degrees.  Full strength with knee flexion and extension.  Imaging: No results found.  Past Medical/Family/Surgical/Social History: Medications & Allergies  reviewed per EMR, new medications updated. Patient Active Problem List   Diagnosis Date Noted   Primary osteoarthritis of left shoulder 03/13/2024   Acute pain of left shoulder 03/13/2024   Screen for colon cancer 12/12/2022   Idiopathic gout of multiple sites 11/05/2022   Effusion of right elbow 11/04/2022   OAB (overactive bladder) 11/22/2021   Stage 3a chronic kidney disease (HCC) 11/02/2020   Hyperplastic colonic polyp 06/29/2020   Cervical cancer screening 06/28/2020   Vitamin D  deficiency disease 04/29/2019   Essential hypertension 04/29/2019   Hyperlipidemia with target LDL less than 100 04/29/2019   Routine general medical examination at a health care facility 06/04/2016   Visit for screening mammogram 06/04/2016   Insomnia 08/17/2015   Rectal incontinence 08/17/2015   ESOPHAGITIS, REFLUX 08/14/2007   Past Medical History:  Diagnosis Date   Arthritis    right hand   Chicken pox    Childhood   Diverticulosis of colon (without mention of hemorrhage)    GERD (gastroesophageal reflux disease)    Hiatal hernia    Hyperplastic colon polyp    Hypertension    Mass of finger of right hand 02/2013   right middle finger   Seasonal allergies    Sinus congestion 03/16/2013   cough, stuffy and runny nose   Urine incontinence    Vitamin D  deficiency    Wears partial dentures    upper   Family History  Problem Relation Age of Onset   Kidney disease Father    Heart disease Father    Stroke Father    Hypertension Father    Heart disease Mother    Depression Mother    Cancer Sister        lung   Breast cancer Sister        both breast removed   Colon cancer Neg Hx    Esophageal cancer Neg Hx    Past Surgical History:  Procedure Laterality Date   COLONOSCOPY  08/14/2007   562.10, 211.3   FOOT SURGERY Bilateral    exc. ingrown toenail great toe   TUBAL LIGATION  09/02/2002   Social History   Occupational History    Employer: [employer]  Tobacco Use   Smoking  status: Former   Smokeless tobacco: Never   Tobacco comments:    quit smoking 20 years ago  Vaping Use   Vaping status: Never Used  Substance and Sexual Activity   Alcohol use: Yes    Alcohol/week: 14.0 standard drinks of alcohol    Types: 14 Glasses of wine per week    Comment: occasionally   Drug use: No   Sexual activity: Yes    Partners: Male    Birth control/protection: Post-menopausal    Comment: 1st intercourse- 74, partners- 4,

## 2024-04-29 ENCOUNTER — Ambulatory Visit: Payer: Self-pay | Admitting: Sports Medicine

## 2024-04-29 LAB — BASIC METABOLIC PANEL WITH GFR
BUN: 9 mg/dL (ref 7–25)
CO2: 24 mmol/L (ref 20–32)
Calcium: 9.2 mg/dL (ref 8.6–10.4)
Chloride: 105 mmol/L (ref 98–110)
Creat: 0.94 mg/dL (ref 0.50–1.05)
Glucose, Bld: 71 mg/dL (ref 65–99)
Potassium: 4.5 mmol/L (ref 3.5–5.3)
Sodium: 140 mmol/L (ref 135–146)
eGFR: 67 mL/min/{1.73_m2} (ref >=60)

## 2024-04-29 LAB — URIC ACID: Uric Acid, Serum: 9.1 mg/dL — ABNORMAL HIGH (ref 2.5–7.0)

## 2024-04-29 LAB — EXTRA LAV TOP TUBE

## 2024-05-03 ENCOUNTER — Other Ambulatory Visit: Payer: Self-pay | Admitting: Sports Medicine

## 2024-05-03 DIAGNOSIS — M1A09X0 Idiopathic chronic gout, multiple sites, without tophus (tophi): Secondary | ICD-10-CM

## 2024-05-03 DIAGNOSIS — G8929 Other chronic pain: Secondary | ICD-10-CM

## 2024-05-03 MED ORDER — ALLOPURINOL 100 MG PO TABS: 200.0000 mg | ORAL_TABLET | Freq: Every day | ORAL | 1 refills | Status: DC

## 2024-05-30 ENCOUNTER — Ambulatory Visit (HOSPITAL_COMMUNITY)
Admission: EM | Admit: 2024-05-30 | Discharge: 2024-05-30 | Disposition: A | Discharge status: Home / Self Care | Attending: Nurse Practitioner | Admitting: Nurse Practitioner

## 2024-05-30 ENCOUNTER — Encounter (HOSPITAL_COMMUNITY): Payer: Self-pay | Admitting: Emergency Medicine

## 2024-05-30 DIAGNOSIS — K0500 Acute gingivitis, plaque induced: Secondary | ICD-10-CM

## 2024-05-30 DIAGNOSIS — R030 Elevated blood-pressure reading, without diagnosis of hypertension: Secondary | ICD-10-CM | POA: Diagnosis not present

## 2024-05-30 MED ORDER — KETOROLAC TROMETHAMINE 60 MG/2ML IM SOLN
INTRAMUSCULAR | Status: AC
Start: 2024-05-30 — End: 2024-05-30
  Filled 2024-05-30: qty 2

## 2024-05-30 MED ORDER — CHLORHEXIDINE GLUCONATE 0.12 % MT SOLN: 15.0000 mL | OROMUCOSAL | 0 refills | Status: AC

## 2024-05-30 MED ORDER — TRAMADOL HCL 50 MG PO TABS: 50.0000 mg | ORAL_TABLET | Freq: Four times a day (QID) | ORAL | 0 refills | Status: DC | PRN

## 2024-05-30 MED ORDER — NAPROXEN 500 MG PO TABS: 500.0000 mg | ORAL_TABLET | Freq: Two times a day (BID) | ORAL | 0 refills | Status: DC

## 2024-05-30 MED ORDER — AMOXICILLIN 875 MG PO TABS
875.0000 mg | ORAL_TABLET | Freq: Two times a day (BID) | ORAL | 0 refills | Status: AC
Start: 2024-05-30 — End: 2024-06-06

## 2024-05-30 MED ORDER — KETOROLAC TROMETHAMINE 60 MG/2ML IM SOLN
60.0000 mg | Freq: Once | INTRAMUSCULAR | Status: AC
  Administered 2024-05-30: 60 mg via INTRAMUSCULAR

## 2024-05-30 NOTE — ED Provider Notes (Signed)
 [facility]    CSN: [id number] Arrival date & time: 05/30/24  1042      History   Chief Complaint Chief Complaint  Patient presents with   Dental Pain    HPI Marie Torres is a 63 y.o. female.   Discussed the use of AI scribe software for clinical note transcription with the patient, who gave verbal consent to proceed.   Patient presents with complaints right upper side gum pain that began upon waking up Friday morning. The patient reports difficulty eating on the affected side and denies any history of similar episodes. The pain is localized to the right upper side of the mouth and is exacerbated by chewing on that side. She has not experienced any fevers or headaches associated with the pain. She has not attempted any remedies for the discomfort prior to this visit. The patient wears upper partial dentures and reports that she did not take them out Thursday night, as she normally does. She does not smoke and reports proper care of her partial dentures, including regular cleaning with denture tablets and removal at night.  The following portions of the patient's history were reviewed and updated as appropriate: allergies, current medications, past family history, past medical history, past social history, past surgical history, and problem list.     Past Medical History:  Diagnosis Date   Arthritis    right hand   Chicken pox    Childhood   Diverticulosis of colon (without mention of hemorrhage)    GERD (gastroesophageal reflux disease)    Hiatal hernia    Hyperplastic colon polyp    Hypertension    Mass of finger of right hand 02/2013   right middle finger   Seasonal allergies    Sinus congestion 03/16/2013   cough, stuffy and runny nose   Urine incontinence    Vitamin D  deficiency    Wears partial dentures    upper    Patient Active Problem List   Diagnosis Date Noted   Primary osteoarthritis of left shoulder 03/13/2024   Acute pain of left shoulder  03/13/2024   Screen for colon cancer 12/12/2022   Idiopathic gout of multiple sites 11/05/2022   Effusion of right elbow 11/04/2022   OAB (overactive bladder) 11/22/2021   Stage 3a chronic kidney disease (HCC) 11/02/2020   Hyperplastic colonic polyp 06/29/2020   Cervical cancer screening 06/28/2020   Vitamin D  deficiency disease 04/29/2019   Essential hypertension 04/29/2019   Hyperlipidemia with target LDL less than 100 04/29/2019   Routine general medical examination at a health care facility 06/04/2016   Visit for screening mammogram 06/04/2016   Insomnia 08/17/2015   Rectal incontinence 08/17/2015   ESOPHAGITIS, REFLUX 08/14/2007    Past Surgical History:  Procedure Laterality Date   COLONOSCOPY  08/14/2007   562.10, 211.3   FOOT SURGERY Bilateral    exc. ingrown toenail great toe   TUBAL LIGATION  09/02/2002    OB History     Gravida  1   Para  1   Term  1   Preterm      AB      Living  1      SAB      IAB      Ectopic      Multiple      Live Births               Home Medications    Prior to Admission medications   Medication Sig  Start Date End Date Taking? Authorizing Provider  amoxicillin  (AMOXIL ) 875 MG tablet Take 1 tablet (875 mg total) by mouth 2 (two) times daily for 7 days. 05/30/24 06/06/24 Yes Iola Lukes, FNP  chlorhexidine  (PERIDEX ) 0.12 % solution Use as directed 15 mLs in the mouth or throat 2 (two) times daily at 8 am and 6 pm for 7 days. Brush teeth then swish in mouth for 2 minutes then spit out. Do not rinse mouth after use. Avoid eating, drinking, smoking and vaping for at least 30 minutes after use 05/30/24 06/06/24 Yes Brent Noto, FNP  naproxen  (NAPROSYN ) 500 MG tablet Take 1 tablet (500 mg total) by mouth 2 (two) times daily with a meal. Take with food to avoid stomach upset. Do not take any additional NSAIDs while on this. You may take tylenol  in addition to this if needed for extra pain relief. 05/30/24  Yes Dia Jefferys,  Lucrecia Mcphearson, FNP  traMADol  (ULTRAM ) 50 MG tablet Take 1 tablet (50 mg total) by mouth every 6 (six) hours as needed for severe pain (pain score 7-10). 05/30/24  Yes Iola Lukes, FNP  acetaminophen  (TYLENOL ) 500 MG tablet Take 1 tablet (500 mg total) by mouth every 6 (six) hours as needed. 06/04/23   Dreama, [place]  N, FNP  allopurinol  (ZYLOPRIM ) 100 MG tablet Take 2 tablets (200 mg total) by mouth daily. 05/03/24 08/31/24  Brooks, Dana, DO  colchicine  0.6 MG tablet Take 1 tablet (0.6 mg total) by mouth 2 (two) times daily for 21 days. 12/24/22 03/12/24  Burnetta Brunet, DO    Family History Family History  Problem Relation Age of Onset   Kidney disease Father    Heart disease Father    Stroke Father    Hypertension Father    Heart disease Mother    Depression Mother    Cancer Sister        lung   Breast cancer Sister        both breast removed   Colon cancer Neg Hx    Esophageal cancer Neg Hx     Social History Social History   Tobacco Use   Smoking status: Former   Smokeless tobacco: Never   Tobacco comments:    quit smoking 20 years ago  Vaping Use   Vaping status: Never Used  Substance Use Topics   Alcohol use: Yes    Alcohol/week: 14.0 standard drinks of alcohol    Types: 14 Glasses of wine per week    Comment: occasionally   Drug use: No     Allergies   Shellfish allergy   Review of Systems Review of Systems  Constitutional:  Negative for fever.  HENT:  Positive for dental problem and facial swelling. Negative for trouble swallowing.   Neurological:  Negative for headaches.  All other systems reviewed and are negative.    Physical Exam Triage Vital Signs ED Triage Vitals  Encounter Vitals Group     BP 05/30/24 1117 (!) 185/76     Girls Systolic BP Percentile --      Girls Diastolic BP Percentile --      Boys Systolic BP Percentile --      Boys Diastolic BP Percentile --      Pulse Rate 05/30/24 1117 69     Resp 05/30/24 1117 18     Temp 05/30/24 1117  98.5 F (36.9 C)     Temp Source 05/30/24 1117 Oral     SpO2 05/30/24 1117 96 %     Weight --  Height --      Head Circumference --      Peak Flow --      Pain Score 05/30/24 1116 8     Pain Loc --      Pain Education --      Exclude from Growth Chart --    No data found.  Updated Vital Signs BP (!) 161/84   Pulse 69   Temp 98.5 F (36.9 C) (Oral)   Resp 18   SpO2 96%   Visual Acuity Right Eye Distance:   Left Eye Distance:   Bilateral Distance:    Right Eye Near:   Left Eye Near:    Bilateral Near:     Physical Exam Vitals reviewed.  Constitutional:      General: She is awake. She is not in acute distress.    Appearance: Normal appearance. She is well-developed and well-groomed. She is not ill-appearing, toxic-appearing or diaphoretic.     Comments: Patient obviously in discomfort due to mouth pain. Holding face with frequent grimaces   HENT:     Head: Normocephalic.     Jaw: There is normal jaw occlusion.      Comments: Obvious right upper facial swelling. No erythema or increased warmth noted.     Right Ear: Hearing normal.     Left Ear: Hearing normal.     Nose: Nose normal.     Mouth/Throat:     Lips: No lesions.     Mouth: Mucous membranes are moist. No oral lesions.     Tongue: No lesions.     Palate: No lesions.     Pharynx: Oropharynx is clear. Uvula midline.     Comments: Patient removed upper partial dentures prior to the oral examination. No natural upper teeth were present. Lower dentition appeared normal bilaterally. Significant gingival swelling was observed in the right upper gums; however, there was no significant erythema, drainage, or evidence of abscess.  Eyes:     General: Vision grossly intact.     Conjunctiva/sclera: Conjunctivae normal.  Cardiovascular:     Rate and Rhythm: Normal rate and regular rhythm.     Heart sounds: Normal heart sounds.  Pulmonary:     Effort: Pulmonary effort is normal.     Breath sounds: Normal breath  sounds and air entry.  Musculoskeletal:        General: Normal range of motion.     Cervical back: Full passive range of motion without pain, normal range of motion and neck supple.  Lymphadenopathy:     Cervical: No cervical adenopathy.  Skin:    General: Skin is warm and dry.  Neurological:     General: No focal deficit present.     Mental Status: She is alert and oriented to person, place, and time.  Psychiatric:        Speech: Speech normal.        Behavior: Behavior is cooperative.      UC Treatments / Results  Labs (all labs ordered are listed, but only abnormal results are displayed) Labs Reviewed - No data to display  EKG   Radiology No results found.  Procedures Procedures (including critical care time)  Medications Ordered in UC Medications  ketorolac  (TORADOL ) injection 60 mg (60 mg Intramuscular Given 05/30/24 1139)    Initial Impression / Assessment and Plan / UC Course  I have reviewed the triage vital signs and the nursing notes.  Pertinent labs & imaging results that were available during my care  of the patient were reviewed by me and considered in my medical decision making (see chart for details).    Patient presents with right upper dental pain that began upon waking on Friday, associated with not removing her upper partial denture the night prior as she normally does. Exam revealed gingival swelling in the right upper gums without erythema, drainage, or signs of abscess. No hygiene concerns were noted. Symptoms are consistent with localized gingival inflammation likely due to denture-related irritation. Blood pressure was elevated on arrival, likely secondary to pain, with mild improvement on recheck. Treatment includes a 7-day course of amoxicillin , prescription Peridex  mouth rinse, and naproxen  twice daily with instructions to avoid additional NSAIDs. Acetaminophen  may be used for breakthrough pain, and tramadol  has been prescribed for severe pain not  relieved by Tylenol . A Toradol  injection was administered in office for immediate relief. Supportive measures include warm saltwater rinses, soft and warm food intake, cool compresses, and avoidance of the partial denture until inflammation resolves. Patient was advised to follow up with a dentist as soon as possible and provided with referral resources. She was instructed to monitor her blood pressure and seek emergency care for severe headaches, visual changes, chest pain, or shortness of breath.   Today's evaluation has revealed no signs of a dangerous process. Discussed diagnosis with patient and/or guardian. Patient and/or guardian aware of their diagnosis, possible red flag symptoms to watch out for and need for close follow up. Patient and/or guardian understands verbal and written discharge instructions. Patient and/or guardian comfortable with plan and disposition.  Patient and/or guardian has a clear mental status at this time, good insight into illness (after discussion and teaching) and has clear judgment to make decisions regarding their care  Documentation was completed with the aid of voice recognition software. Transcription may contain typographical errors. Final Clinical Impressions(s) / UC Diagnoses   Final diagnoses:  Acute gingival inflammation  Elevated blood pressure reading without diagnosis of hypertension     Discharge Instructions      You are being treated for gum inflammation likely caused by irritation from your upper partial denture. You have been prescribed amoxicillin  to treat any underlying infection, Peridex  (chlorhexidine ) mouth rinse to reduce bacteria in the mouth, naproxen  for inflammation and pain, and tramadol  for severe pain not relieved by Tylenol . A Toradol  injection was given today to help with immediate pain relief. Take all medications exactly as prescribed. When using Peridex , do not rinse your mouth afterward and avoid eating or drinking for at least  30 minutes.  Do not use your partial denture until the inflammation improves. Rinse your mouth with warm salt water several times a day and after meals. Stick to soft, warm foods and avoid anything crunchy or spicy. Drink fluids at room temperature and avoid using straws. Apply a cool compress to the affected side of your face for 10-15 minutes at a time several times a day, but do not place ice directly on the skin.  Your blood pressure was elevated today, likely related to pain, and improved slightly after treatment. Continue monitoring your blood pressure. If you experience severe headache, dizziness, blurred vision, chest pain, or shortness of breath, go to the emergency room.  Follow up with a dentist as soon as possible. You were given resources to assist with scheduling a dental appointment and should call by tomorrow to arrange care. If symptoms worsen or do not improve after completing the antibiotic course, contact your primary care provider.  ED Prescriptions     Medication Sig Dispense Auth. Provider   amoxicillin  (AMOXIL ) 875 MG tablet Take 1 tablet (875 mg total) by mouth 2 (two) times daily for 7 days. 14 tablet Zerick Prevette, [place], FNP   chlorhexidine  (PERIDEX ) 0.12 % solution Use as directed 15 mLs in the mouth or throat 2 (two) times daily at 8 am and 6 pm for 7 days. Brush teeth then swish in mouth for 2 minutes then spit out. Do not rinse mouth after use. Avoid eating, drinking, smoking and vaping for at least 30 minutes after use 210 mL Lily Velasquez, FNP   naproxen  (NAPROSYN ) 500 MG tablet Take 1 tablet (500 mg total) by mouth 2 (two) times daily with a meal. Take with food to avoid stomach upset. Do not take any additional NSAIDs while on this. You may take tylenol  in addition to this if needed for extra pain relief. 20 tablet Iola Lukes, FNP   traMADol  (ULTRAM ) 50 MG tablet Take 1 tablet (50 mg total) by mouth every 6 (six) hours as needed for severe pain (pain  score 7-10). 12 tablet Iola Lukes, FNP      I have reviewed the PDMP during this encounter.   Iola Lukes, [place] 05/30/24 1154

## 2024-05-30 NOTE — Discharge Instructions (Addendum)
 You are being treated for gum inflammation likely caused by irritation from your upper partial denture. You have been prescribed amoxicillin  to treat any underlying infection, Peridex  (chlorhexidine ) mouth rinse to reduce bacteria in the mouth, naproxen  for inflammation and pain, and tramadol  for severe pain not relieved by Tylenol . A Toradol  injection was given today to help with immediate pain relief. Take all medications exactly as prescribed. When using Peridex , do not rinse your mouth afterward and avoid eating or drinking for at least 30 minutes.  Do not use your partial denture until the inflammation improves. Rinse your mouth with warm salt water several times a day and after meals. Stick to soft, warm foods and avoid anything crunchy or spicy. Drink fluids at room temperature and avoid using straws. Apply a cool compress to the affected side of your face for 10-15 minutes at a time several times a day, but do not place ice directly on the skin.  Your blood pressure was elevated today, likely related to pain, and improved slightly after treatment. Continue monitoring your blood pressure. If you experience severe headache, dizziness, blurred vision, chest pain, or shortness of breath, go to the emergency room.  Follow up with a dentist as soon as possible. You were given resources to assist with scheduling a dental appointment and should call by tomorrow to arrange care. If symptoms worsen or do not improve after completing the antibiotic course, contact your primary care provider.

## 2024-05-30 NOTE — ED Triage Notes (Signed)
 Pt reports right side dental pain that started Friday. Reports pain and swelling got worse yesterday. Thought was her partial on the top but it isnt. Hasn't tried anything to help with pain.  Pt adds that she isnt able to eat.

## 2024-06-22 ENCOUNTER — Encounter: Payer: Self-pay | Admitting: Sports Medicine

## 2024-06-22 ENCOUNTER — Other Ambulatory Visit: Payer: Self-pay

## 2024-06-22 ENCOUNTER — Other Ambulatory Visit (INDEPENDENT_AMBULATORY_CARE_PROVIDER_SITE_OTHER): Payer: Self-pay

## 2024-06-22 ENCOUNTER — Ambulatory Visit (INDEPENDENT_AMBULATORY_CARE_PROVIDER_SITE_OTHER): Admitting: Sports Medicine

## 2024-06-22 DIAGNOSIS — M222X1 Patellofemoral disorders, right knee: Secondary | ICD-10-CM | POA: Diagnosis not present

## 2024-06-22 DIAGNOSIS — M25562 Pain in left knee: Secondary | ICD-10-CM

## 2024-06-22 DIAGNOSIS — G8929 Other chronic pain: Secondary | ICD-10-CM

## 2024-06-22 DIAGNOSIS — M1A09X0 Idiopathic chronic gout, multiple sites, without tophus (tophi): Secondary | ICD-10-CM | POA: Diagnosis not present

## 2024-06-22 DIAGNOSIS — M222X2 Patellofemoral disorders, left knee: Secondary | ICD-10-CM

## 2024-06-22 DIAGNOSIS — M109 Gout, unspecified: Secondary | ICD-10-CM

## 2024-06-22 MED ORDER — MELOXICAM 15 MG PO TABS: 15.0000 mg | ORAL_TABLET | Freq: Every day | ORAL | 0 refills | Status: DC

## 2024-06-22 MED ORDER — PREDNISONE 10 MG PO TABS: ORAL_TABLET | ORAL | 0 refills | Status: DC

## 2024-06-22 NOTE — Progress Notes (Signed)
 Marie Torres - 63 y.o. female MRN [id number]  Date of birth: November 26, 1960  Office Visit Note: Visit Date: 06/22/2024 PCP: Joshua Debby CROME, MD Referred by: Joshua Debby CROME, MD  Subjective: Chief Complaint  Patient presents with   Left Knee - Pain   HPI: Marie Torres is a pleasant 63 y.o. female who presents today for acute left knee pain and swelling, history of gout.  Marie Torres had insidious onset of sharp and severe left knee pain and swelling about 2 weeks ago.  No inciting event.  She has been taking meloxicam  15 mg once daily for the last few days which has helped somewhat but she still has notable pain as well as restriction in motion.  She does have gout, she has not been taking her allopurinol  (prescribed allopurinol  200 mg daily) for at least the last 3 to 4 months because she thought this may make her drowsy.  She has had multiple gouty flares including the right elbow and both knees in the past.   Pertinent ROS were reviewed with the patient and found to be negative unless otherwise specified above in HPI.   Assessment & Plan: Visit Diagnoses:  1. Acute gout of left knee, unspecified cause   2. Chronic pain of left knee   3. Idiopathic chronic gout of multiple sites without tophus   4. Patellofemoral arthralgia of both knees    Plan: Impression is acute on chronic gout flare with active exacerbation in the left knee.  This is secondary to medication adherence as she has not been taking her allopurinol  for at least 3 to 4 months.  She does have bilateral patellofemoral arthralgia but the right knee is actually doing fairly well at this time.  We did perform ultrasound of the left knee today which shows evidence of gouty flare with a small effusion, but I do not think aspiration is warranted at this time.  We discussed injection therapy but given her pain she would like to avoid this.  I will allow her to continue meloxicam  15 mg once daily as she is finding this helpful.  We will  place her on a 12-day prednisone  taper, see medication regimen as below.  Recommended ice and compression for the knee in the short-term.  I did provide her a note to excuse her from work for today and tomorrow.  I would like to see her back in the next 2-3 weeks to reevaluate and ensure that she is significantly improving.  Could consider injection at that time only if needed.  I would like her to hold from resuming allopurinol  at this time, but once her acute flare subsides we will   Follow-up: Return in about 15 days (around 07/07/2024) for f/u in 2-3 weeks for L-knee.   Meds & Orders:  Meds ordered this encounter  Medications   meloxicam  (MOBIC ) 15 MG tablet    Sig: Take 1 tablet (15 mg total) by mouth daily.    Dispense:  30 tablet    Refill:  0   predniSONE  (DELTASONE ) 10 MG tablet    Sig: Take 4 tablets (40mg  total) with breakfast for 3 days, then take Take 3 tablets (30mg  total) with breakfast for 3 days, then Take 2 tablets (20mg  total) with breakfast for 3 days, followed by 1 tablets (10mg  total) with breakfast for 3 days. Take until completion. This prescription should last 12 days total.    Dispense:  30 tablet    Refill:  0  Orders Placed This Encounter  Procedures   XR Knee Complete 4 Views Left   US  Extrem Low Left Ltd     Procedures: No procedures performed      Clinical History: No specialty comments available.  She reports that she has quit smoking. She has never used smokeless tobacco.  Recent Labs    07/16/23 1619 10/30/23 0817 04/28/24 0845  LABURIC 6.7 8.6* 9.1*    Objective:    Physical Exam  Gen: Well-appearing, in no acute distress; non-toxic CV: Well-perfused. Warm.  Resp: Breathing unlabored on room air; no wheezing. Psych: Fluid speech in conversation; appropriate affect; normal thought process  Ortho Exam - Left knee: The left knee has a small to moderate effusion with soft tissue swelling with warmth to the knee but no cellulitic change or  skin redness.  There is limited range of motion in all planes.  She is markedly tender to touch in all compartments of the knee.  Antalgic gait noted.  Imaging: US  Extrem Low Left Ltd Result Date: 06/22/2024 Limited musculoskeletal ultrasound of the left knee was performed today.  Ultrasound demonstrates a small to small to moderate knee effusion.  There is hyperemia and associated cobblestoning in the soft tissue, indicative of inflammatory process.  Small patellar spur off the superior aspect but no cortical regularity.  Overlying quadricep tendon was visualized without any evidence of tearing.  Lateral joint line has a small effusion with hyperemia.  XR Knee Complete 4 Views Left Result Date: 06/22/2024 4 views of the left knee including AP standing, Rosenberg, lateral and sunrise views were ordered and reviewed by myself today.  X-rays demonstrate minimal tibiofemoral joint space narrowing.  There is superior spurring off the patella with mild patellofemoral arthrosis.  Likely small joint effusion noted on lateral view.  Otherwise no acute bony abnormality noted.  R-knee 07/16/23: Radiographs of her right knee demonstrate well-maintained alignment no  evidence of degenerative changes no evidence of osteonecrosis or lytic  lesions.  No evidence of fracture.   Past Medical/Family/Surgical/Social History: Medications & Allergies reviewed per EMR, new medications updated. Patient Active Problem List   Diagnosis Date Noted   Primary osteoarthritis of left shoulder 03/13/2024   Acute pain of left shoulder 03/13/2024   Screen for colon cancer 12/12/2022   Idiopathic gout of multiple sites 11/05/2022   Effusion of right elbow 11/04/2022   OAB (overactive bladder) 11/22/2021   Stage 3a chronic kidney disease (HCC) 11/02/2020   Hyperplastic colonic polyp 06/29/2020   Cervical cancer screening 06/28/2020   Vitamin D  deficiency disease 04/29/2019   Essential hypertension 04/29/2019    Hyperlipidemia with target LDL less than 100 04/29/2019   Routine general medical examination at a health care facility 06/04/2016   Visit for screening mammogram 06/04/2016   Insomnia 08/17/2015   Rectal incontinence 08/17/2015   ESOPHAGITIS, REFLUX 08/14/2007   Past Medical History:  Diagnosis Date   Arthritis    right hand   Chicken pox    Childhood   Diverticulosis of colon (without mention of hemorrhage)    GERD (gastroesophageal reflux disease)    Hiatal hernia    Hyperplastic colon polyp    Hypertension    Mass of finger of right hand 02/2013   right middle finger   Seasonal allergies    Sinus congestion 03/16/2013   cough, stuffy and runny nose   Urine incontinence    Vitamin D  deficiency    Wears partial dentures    upper   Family History  Problem Relation Age of Onset   Kidney disease Father    Heart disease Father    Stroke Father    Hypertension Father    Heart disease Mother    Depression Mother    Cancer Sister        lung   Breast cancer Sister        both breast removed   Colon cancer Neg Hx    Esophageal cancer Neg Hx    Past Surgical History:  Procedure Laterality Date   COLONOSCOPY  08/14/2007   562.10, 211.3   FOOT SURGERY Bilateral    exc. ingrown toenail great toe   TUBAL LIGATION  09/02/2002   Social History   Occupational History    Employer: [employer]  Tobacco Use   Smoking status: Former   Smokeless tobacco: Never   Tobacco comments:    quit smoking 20 years ago  Vaping Use   Vaping status: Never Used  Substance and Sexual Activity   Alcohol use: Yes    Alcohol/week: 14.0 standard drinks of alcohol    Types: 14 Glasses of wine per week    Comment: occasionally   Drug use: No   Sexual activity: Yes    Partners: Male    Birth control/protection: Post-menopausal    Comment: 1st intercourse- 23, partners- 4,

## 2024-06-22 NOTE — Progress Notes (Signed)
 Patient says that she began having left knee pain, without known injury, about 2 weeks ago. She says it is severe and she is unable to bend the knee without pain. She says that her pain is worse than the right knee was previously. She has taken Meloxicam  which has been helpful for her. She says that she has not been taking her Allopurinol , and it has been awhile since she did take it. She points to the medial and lateral borders of the patella when describing her location of pain.

## 2024-07-09 ENCOUNTER — Encounter: Payer: Self-pay | Admitting: Sports Medicine

## 2024-07-09 ENCOUNTER — Ambulatory Visit: Admitting: Sports Medicine

## 2024-07-09 DIAGNOSIS — M222X2 Patellofemoral disorders, left knee: Secondary | ICD-10-CM

## 2024-07-09 DIAGNOSIS — M25562 Pain in left knee: Secondary | ICD-10-CM

## 2024-07-09 DIAGNOSIS — M1A09X0 Idiopathic chronic gout, multiple sites, without tophus (tophi): Secondary | ICD-10-CM | POA: Diagnosis not present

## 2024-07-09 DIAGNOSIS — M222X1 Patellofemoral disorders, right knee: Secondary | ICD-10-CM | POA: Diagnosis not present

## 2024-07-09 DIAGNOSIS — Z91148 Patient's other noncompliance with medication regimen for other reason: Secondary | ICD-10-CM

## 2024-07-09 DIAGNOSIS — G8929 Other chronic pain: Secondary | ICD-10-CM

## 2024-07-09 NOTE — Progress Notes (Signed)
 Marie Torres - 63 y.o. female MRN [id number]  Date of birth: 1960-11-28  Office Visit Note: Visit Date: 07/09/2024 PCP: Joshua Debby CROME, MD Referred by: Joshua Debby CROME, MD  Subjective: Chief Complaint  Patient presents with   Left Knee - Follow-up   HPI: Marie Torres is a pleasant 63 y.o. female who presents today for follow-up of left > right patellofemoral arthralgia with follow-up of acute gout with left knee effusion.  The left knee is feeling much better.  She reports having resolution of her pain and her swelling from the gout infection.  She did complete her 12-day prednisone  taper.  Her pain has not returned since stopping this.  She is not taking her allopurinol  medication as we discussed at our last visit, given her current flare.  The right knee bothers her occasionally at times but it is mild and usually subsides, she does not feel the need to take any consistent medication for this.  Meloxicam  10 mg daily as needed has worked in the past.  Pertinent ROS were reviewed with the patient and found to be negative unless otherwise specified above in HPI.   Assessment & Plan: Visit Diagnoses:  1. Patellofemoral arthralgia of both knees   2. Chronic pain of left knee   3. Idiopathic chronic gout of multiple sites without tophus   4. Nonadherence to medication    Plan: Impression is resolution of acute gout flare of the left knee.  She does have bilateral patellofemoral arthralgia which is mild in nature.  She completed her prednisone  course for her gout flare, we will discontinue this going forward.  Now that she is out of her active flare, discussed with [name] to resume her allopurinol  200 mg daily.  She has had issue with medication adherence to this in the past, I both wrote down these instructions and placed this in her AVS which was printed out at the end of the visit.  I also reviewed low purine foods and did provide her with a handout for foods to avoid that were high in  purine that would worsen her gout.  She will continue her meloxicam  15 mg once daily as needed for arthritic related pain.  I will see her back in 3 months and we will repeat uric acid level to ensure this is downtrending after starting back on allopurinol .  Follow-up: Return in about 3 months (around 10/08/2024) for for gout, knee OA f/u (draw uric acid).   Meds & Orders: No orders of the defined types were placed in this encounter.  No orders of the defined types were placed in this encounter.    Procedures: No procedures performed      Clinical History: No specialty comments available.  She reports that she has quit smoking. She has never used smokeless tobacco.  Recent Labs    07/16/23 1619 10/30/23 0817 04/28/24 0845  LABURIC 6.7 8.6* 9.1*    Objective:    Physical Exam  Gen: Well-appearing, in no acute distress; non-toxic CV: Well-perfused. Warm.  Resp: Breathing unlabored on room air; no wheezing. Psych: Fluid speech in conversation; appropriate affect; normal thought process  Ortho Exam - Bilateral knees: There is no longer any effusion of the left knee, no redness or swelling of the right knee.  There is mild patellofemoral crepitus left greater than right knee.  Range of motion is preserved from 0-130 and 0-135 degrees.  Imaging: No results found.  Past Medical/Family/Surgical/Social History: Medications & Allergies reviewed per  EMR, new medications updated. Patient Active Problem List   Diagnosis Date Noted   Primary osteoarthritis of left shoulder 03/13/2024   Acute pain of left shoulder 03/13/2024   Screen for colon cancer 12/12/2022   Idiopathic gout of multiple sites 11/05/2022   Effusion of right elbow 11/04/2022   OAB (overactive bladder) 11/22/2021   Stage 3a chronic kidney disease (HCC) 11/02/2020   Hyperplastic colonic polyp 06/29/2020   Cervical cancer screening 06/28/2020   Vitamin D  deficiency disease 04/29/2019   Essential hypertension  04/29/2019   Hyperlipidemia with target LDL less than 100 04/29/2019   Routine general medical examination at a health care facility 06/04/2016   Visit for screening mammogram 06/04/2016   Insomnia 08/17/2015   Rectal incontinence 08/17/2015   ESOPHAGITIS, REFLUX 08/14/2007   Past Medical History:  Diagnosis Date   Arthritis    right hand   Chicken pox    Childhood   Diverticulosis of colon (without mention of hemorrhage)    GERD (gastroesophageal reflux disease)    Hiatal hernia    Hyperplastic colon polyp    Hypertension    Mass of finger of right hand 02/2013   right middle finger   Seasonal allergies    Sinus congestion 03/16/2013   cough, stuffy and runny nose   Urine incontinence    Vitamin D  deficiency    Wears partial dentures    upper   Family History  Problem Relation Age of Onset   Kidney disease Father    Heart disease Father    Stroke Father    Hypertension Father    Heart disease Mother    Depression Mother    Cancer Sister        lung   Breast cancer Sister        both breast removed   Colon cancer Neg Hx    Esophageal cancer Neg Hx    Past Surgical History:  Procedure Laterality Date   COLONOSCOPY  08/14/2007   562.10, 211.3   FOOT SURGERY Bilateral    exc. ingrown toenail great toe   TUBAL LIGATION  09/02/2002   Social History   Occupational History    Employer: [employer]  Tobacco Use   Smoking status: Former   Smokeless tobacco: Never   Tobacco comments:    quit smoking 20 years ago  Vaping Use   Vaping status: Never Used  Substance and Sexual Activity   Alcohol use: Yes    Alcohol/week: 14.0 standard drinks of alcohol    Types: 14 Glasses of wine per week    Comment: occasionally   Drug use: No   Sexual activity: Yes    Partners: Male    Birth control/protection: Post-menopausal    Comment: 1st intercourse- 56, partners- 4,

## 2024-07-09 NOTE — Progress Notes (Signed)
 Patient says that her knee is feeling much better. She says that the Prednisone  helped, and her knee is no longer bothering her. She has been using Lidocaine  patches on her knees, as well. She denies use of any other home treatments.

## 2024-07-09 NOTE — Patient Instructions (Addendum)
 [name]:  - Review the gout handout with which foods are good to eat and which are good to avoid  - You would take allopurinol  (100mg ) 2 tablets once a day, every day  - You may take meloxicam  15 mg, 1 tablet every day as needed for pain/knee pain  - F/u with Dr. Burnetta in 3 months

## 2024-07-20 ENCOUNTER — Other Ambulatory Visit: Payer: Self-pay | Admitting: Sports Medicine

## 2024-07-20 DIAGNOSIS — G8929 Other chronic pain: Secondary | ICD-10-CM

## 2024-07-20 DIAGNOSIS — M109 Gout, unspecified: Secondary | ICD-10-CM

## 2024-07-27 ENCOUNTER — Ambulatory Visit: Admitting: Internal Medicine

## 2024-08-02 ENCOUNTER — Encounter: Payer: Self-pay | Admitting: Internal Medicine

## 2024-08-02 ENCOUNTER — Ambulatory Visit (INDEPENDENT_AMBULATORY_CARE_PROVIDER_SITE_OTHER): Admitting: Internal Medicine

## 2024-08-02 VITALS — BP 158/86 | HR 84 | Temp 98.4°F | Resp 16 | Ht 61.0 in | Wt 160.0 lb

## 2024-08-02 DIAGNOSIS — R197 Diarrhea, unspecified: Secondary | ICD-10-CM | POA: Insufficient documentation

## 2024-08-02 DIAGNOSIS — I10 Essential (primary) hypertension: Secondary | ICD-10-CM

## 2024-08-02 DIAGNOSIS — M1A09X0 Idiopathic chronic gout, multiple sites, without tophus (tophi): Secondary | ICD-10-CM

## 2024-08-02 DIAGNOSIS — E559 Vitamin D deficiency, unspecified: Secondary | ICD-10-CM

## 2024-08-02 DIAGNOSIS — Z0000 Encounter for general adult medical examination without abnormal findings: Secondary | ICD-10-CM | POA: Diagnosis not present

## 2024-08-02 DIAGNOSIS — E785 Hyperlipidemia, unspecified: Secondary | ICD-10-CM | POA: Diagnosis not present

## 2024-08-02 DIAGNOSIS — Z0001 Encounter for general adult medical examination with abnormal findings: Secondary | ICD-10-CM | POA: Insufficient documentation

## 2024-08-02 DIAGNOSIS — K635 Polyp of colon: Secondary | ICD-10-CM

## 2024-08-02 DIAGNOSIS — Z1231 Encounter for screening mammogram for malignant neoplasm of breast: Secondary | ICD-10-CM

## 2024-08-02 LAB — URINALYSIS, ROUTINE W REFLEX MICROSCOPIC
Bilirubin Urine: NEGATIVE
Hgb urine dipstick: NEGATIVE
Ketones, ur: NEGATIVE
Leukocytes,Ua: NEGATIVE
Nitrite: NEGATIVE
RBC / HPF: NONE SEEN (ref 0–?)
Specific Gravity, Urine: >=1.03 — AB (ref 1.000–1.030)
Total Protein, Urine: NEGATIVE
Urine Glucose: NEGATIVE
Urobilinogen, UA: 0.2 (ref 0.0–1.0)
pH: 6 (ref 5.0–8.0)

## 2024-08-02 LAB — CBC WITH DIFFERENTIAL/PLATELET
Basophils Absolute: 0.1 K/uL (ref 0.0–0.1)
Basophils Relative: 0.8 % (ref 0.0–3.0)
Eosinophils Absolute: 0.1 K/uL (ref 0.0–0.7)
Eosinophils Relative: 0.9 % (ref 0.0–5.0)
HCT: 39.8 % (ref 36.0–46.0)
Hemoglobin: 13.3 g/dL (ref 12.0–15.0)
Lymphocytes Relative: 27.4 % (ref 12.0–46.0)
Lymphs Abs: 2.4 K/uL (ref 0.7–4.0)
MCHC: 33.4 g/dL (ref 30.0–36.0)
MCV: 88.1 fl (ref 78.0–100.0)
Monocytes Absolute: 0.6 K/uL (ref 0.1–1.0)
Monocytes Relative: 7.1 % (ref 3.0–12.0)
Neutro Abs: 5.5 K/uL (ref 1.4–7.7)
Neutrophils Relative %: 63.8 % (ref 43.0–77.0)
Platelets: 258 K/uL (ref 150.0–400.0)
RBC: 4.52 Mil/uL (ref 3.87–5.11)
RDW: 13.8 % (ref 11.5–15.5)
WBC: 8.6 K/uL (ref 4.0–10.5)

## 2024-08-02 LAB — TSH: TSH: 1.58 u[IU]/mL (ref 0.35–5.50)

## 2024-08-02 LAB — VITAMIN D 25 HYDROXY (VIT D DEFICIENCY, FRACTURES): VITD: 10.85 ng/mL — ABNORMAL LOW (ref 30.00–100.00)

## 2024-08-02 NOTE — Patient Instructions (Signed)

## 2024-08-02 NOTE — Progress Notes (Unsigned)
 Subjective:  Patient ID: Marie Torres, female    DOB: 06-29-1961  Age: 63 y.o. MRN: [id number]  CC: Annual Exam, Hypertension, and Hyperlipidemia   HPI ELVIN BANKER presents for a CPX and f/up ---  Discussed the use of AI scribe software for clinical note transcription with the patient, who gave verbal consent to proceed.  History of Present Illness Marie Torres is a 63 year old female who presents with unintentional weight loss and diarrhea.  She has experienced unintentional weight loss over the past two months, which she attributes to increased bowel movements and diarrhea. She believes this is related to her consumption of 'pink balsamic'.  She reports frequent diarrhea and has not undergone colon cancer screening. There is no family history of colon cancer, although her father or mother had cancer, and her sister and nephew had cancer. Her mother had dementia.  No chest pain, shortness of breath, dizziness, or lightheadedness. She reports that she stays active and denies any trouble with her breathing.  She has a history of gout and sees a rheumatologist for management. She does not take any medications for high blood pressure and denies any history of vitamin D  deficiency.  She works two jobs, does not smoke or drink alcohol, but drinks wine to relax after work.   Outpatient Medications Prior to Visit  Medication Sig Dispense Refill   meloxicam  (MOBIC ) 15 MG tablet Take 15 mg by mouth as needed for pain.     allopurinol  (ZYLOPRIM ) 100 MG tablet Take 2 tablets (200 mg total) by mouth daily. 120 tablet 1   acetaminophen  (TYLENOL ) 500 MG tablet Take 1 tablet (500 mg total) by mouth every 6 (six) hours as needed. 30 tablet 0   colchicine  0.6 MG tablet Take 1 tablet (0.6 mg total) by mouth 2 (two) times daily for 21 days. 40 tablet 0   meloxicam  (MOBIC ) 15 MG tablet TAKE 1 TABLET (15 MG TOTAL) BY MOUTH DAILY. 30 tablet 0   predniSONE  (DELTASONE ) 10 MG tablet Take 4  tablets (40mg  total) with breakfast for 3 days, then take Take 3 tablets (30mg  total) with breakfast for 3 days, then Take 2 tablets (20mg  total) with breakfast for 3 days, followed by 1 tablets (10mg  total) with breakfast for 3 days. Take until completion. This prescription should last 12 days total. 30 tablet 0   traMADol  (ULTRAM ) 50 MG tablet Take 1 tablet (50 mg total) by mouth every 6 (six) hours as needed for severe pain (pain score 7-10). 12 tablet 0   No facility-administered medications prior to visit.    ROS Review of Systems  Constitutional:  Negative for appetite change, chills, diaphoresis, fatigue and fever.  HENT: Negative.    Eyes: Negative.  Negative for visual disturbance.  Respiratory: Negative.  Negative for cough, chest tightness, shortness of breath and wheezing.   Cardiovascular:  Negative for chest pain, palpitations and leg swelling.  Gastrointestinal:  Positive for diarrhea. Negative for abdominal pain, blood in stool, constipation, nausea and vomiting.  Endocrine: Negative.   Genitourinary: Negative.  Negative for difficulty urinating.  Musculoskeletal:  Negative for arthralgias, joint swelling and myalgias.  Skin: Negative.   Neurological: Negative.  Negative for dizziness, weakness, light-headedness and headaches.  Hematological:  Negative for adenopathy. Does not bruise/bleed easily.  Psychiatric/Behavioral: Negative.      Objective:  BP (!) 158/86 (BP Location: Left Arm, Patient Position: Sitting, Cuff Size: Normal) Comment: BP (R) 152/92  Pulse 84   Temp  98.4 F (36.9 C) (Oral)   Resp 16   Ht 5' 1 (1.549 m)   Wt 160 lb (72.6 kg)   SpO2 97%   BMI 30.23 kg/m   BP Readings from Last 3 Encounters:  08/02/24 (!) 158/86  05/30/24 (!) 161/84  03/12/24 130/80    Wt Readings from Last 3 Encounters:  08/02/24 160 lb (72.6 kg)  04/28/24 161 lb 9.6 oz (73.3 kg)  03/12/24 165 lb 12.8 oz (75.2 kg)    Physical Exam Vitals reviewed.  Constitutional:       Appearance: Normal appearance.  HENT:     Nose: Nose normal.     Mouth/Throat:     Mouth: Mucous membranes are moist.  Eyes:     General: No scleral icterus.    Conjunctiva/sclera: Conjunctivae normal.  Cardiovascular:     Rate and Rhythm: Normal rate and regular rhythm. Occasional Extrasystoles are present.    Heart sounds: No murmur heard.    No friction rub. No gallop.     Comments: EKG--- SR with PAC's, 76 bpm No LVH, Q waves, or ST/T wave changes  Pulmonary:     Effort: Pulmonary effort is normal.     Breath sounds: No stridor. No wheezing, rhonchi or rales.  Abdominal:     General: Abdomen is flat.     Palpations: There is no mass.     Tenderness: There is no abdominal tenderness. There is no guarding.     Hernia: No hernia is present.  Musculoskeletal:     Cervical back: Neck supple.     Right lower leg: No edema.     Left lower leg: No edema.  Lymphadenopathy:     Cervical: No cervical adenopathy.  Skin:    General: Skin is warm and dry.     Findings: No rash.  Neurological:     General: No focal deficit present.     Mental Status: She is alert. Mental status is at baseline.  Psychiatric:        Mood and Affect: Mood normal.        Behavior: Behavior normal.     Lab Results  Component Value Date   WBC 8.6 08/02/2024   HGB 13.3 08/02/2024   HCT 39.8 08/02/2024   PLT 258.0 08/02/2024   GLUCOSE 71 04/28/2024   CHOL 199 08/02/2024   TRIG 165.0 (H) 08/02/2024   HDL 78.00 08/02/2024   LDLDIRECT 150.8 11/30/2013   LDLCALC 88 08/02/2024   ALT 16 08/02/2024   AST 22 08/02/2024   NA 140 04/28/2024   K 4.5 04/28/2024   CL 105 04/28/2024   CREATININE 0.94 04/28/2024   BUN 9 04/28/2024   CO2 24 04/28/2024   TSH 1.58 08/02/2024   HGBA1C 5.7 04/29/2019    No results found.  Assessment & Plan:   Essential hypertension- She has not achieved her BP goal. EKG is negative for LVH. Will start a CCB. -     TSH; Future -     Urinalysis, Routine w reflex  microscopic; Future -     CBC with Differential/Platelet; Future -     EKG 12-Lead -     amLODIPine  Besylate; Take 1 tablet (5 mg total) by mouth daily.  Dispense: 90 tablet; Refill: 0 -     AMB Referral [facility] Care Management  Hyperlipidemia with target LDL less than 100- Will start a statin for CV risk reduction. -     Lipid panel; Future -  Hepatic function panel; Future -     Rosuvastatin  Calcium ; Take 1 tablet (10 mg total) by mouth daily.  Dispense: 90 tablet; Refill: 0 -     AMB Referral [facility] Care Management  Hyperplastic colonic polyp, unspecified part of colon -     Ambulatory referral to Gastroenterology  Idiopathic chronic gout of multiple sites without tophus- She has not achieve her uric acid goal. -     Uric acid; Future -     Allopurinol ; Take 1 tablet (300 mg total) by mouth daily.  Dispense: 90 tablet; Refill: 0 -     AMB Referral [facility]BCI Care Management  Vitamin D  deficiency disease -     VITAMIN D  25 Hydroxy (Vit-D Deficiency, Fractures); Future -     Cholecalciferol ; Take 1 capsule (50,000 Units total) by mouth once a week.  Dispense: 12 capsule; Refill: 0 -     AMB Referral [facility]BCI Care Management  Visit for screening mammogram -     Digital Screening Mammogram, Left and Right; Future  Encounter for general adult medical examination with abnormal findings- Exam completed, labs reviewed, vaccines reviewed (she refused all) , cancer screenings addressed, pt ed material was given.   Diarrhea, unspecified type -     Tissue Transglutaminase Abs,IgG,IgA; Future     Follow-up: Return in about 3 months (around 11/02/2024).  Debby Molt, MD

## 2024-08-03 LAB — LIPID PANEL
Cholesterol: 199 mg/dL (ref 0–200)
HDL: 78 mg/dL
LDL Cholesterol: 88 mg/dL (ref 0–99)
NonHDL: 120.6
Total CHOL/HDL Ratio: 3
Triglycerides: 165 mg/dL — ABNORMAL HIGH (ref 0.0–149.0)
VLDL: 33 mg/dL (ref 0.0–40.0)

## 2024-08-03 LAB — HEPATIC FUNCTION PANEL
ALT: 16 U/L (ref 0–35)
AST: 22 U/L (ref 0–37)
Albumin: 4.1 g/dL (ref 3.5–5.2)
Alkaline Phosphatase: 60 U/L (ref 39–117)
Bilirubin, Direct: 0.1 mg/dL (ref 0.0–0.3)
Total Bilirubin: 0.4 mg/dL (ref 0.2–1.2)
Total Protein: 7.2 g/dL (ref 6.0–8.3)

## 2024-08-03 LAB — URIC ACID: Uric Acid, Serum: 10.5 mg/dL — ABNORMAL HIGH (ref 2.4–7.0)

## 2024-08-03 MED ORDER — ROSUVASTATIN CALCIUM 10 MG PO TABS: 10.0000 mg | ORAL_TABLET | Freq: Every day | ORAL | 0 refills | Status: DC

## 2024-08-03 MED ORDER — CHOLECALCIFEROL 1.25 MG (50000 UT) PO CAPS: 50000.0000 [IU] | ORAL_CAPSULE | ORAL | 0 refills | Status: AC

## 2024-08-03 MED ORDER — AMLODIPINE BESYLATE 5 MG PO TABS: 5.0000 mg | ORAL_TABLET | Freq: Every day | ORAL | 0 refills | Status: DC

## 2024-08-03 MED ORDER — ALLOPURINOL 300 MG PO TABS: 300.0000 mg | ORAL_TABLET | Freq: Every day | ORAL | 0 refills | Status: DC

## 2024-08-05 ENCOUNTER — Telehealth: Payer: Self-pay | Admitting: *Deleted

## 2024-08-05 NOTE — Progress Notes (Signed)
 Care Guide Pharmacy Note  08/05/2024 Name: Marie Torres MRN: [id number] DOB: 01-13-1961  Referred By: Joshua Debby CROME, MD Reason for referral: Call Attempt #1 and Complex Care Management (Outreach to schedule referral with pharmacist )   Marie Torres is a 63 y.o. year old female who is a primary care patient of Joshua Debby CROME, MD.  Marie Torres was referred to the pharmacist for assistance related to: HTN  An unsuccessful telephone outreach was attempted today to contact the patient who was referred to the pharmacy team for assistance with medication management. Additional attempts will be made to contact the patient.  Thedford Franks, CMA [place]  [facility], [facility] Guide Direct Dial: [phone]  Fax: [phone] Website: [email or web address]

## 2024-08-06 ENCOUNTER — Ambulatory Visit: Payer: Self-pay | Admitting: Internal Medicine

## 2024-08-06 NOTE — Progress Notes (Signed)
 Care Guide Pharmacy Note  08/06/2024 Name: KONA YUSUF MRN: [id number] DOB: [birth year]  Referred By: Joshua Debby CROME, MD Reason for referral: Call Attempt #1 and Complex Care Management (Outreach to schedule referral with pharmacist )   ZIOMARA BIRENBAUM is a 63 y.o. year old female who is a primary care patient of Joshua Debby CROME, MD.  Sharlet JINNY Gavel was referred to the pharmacist for assistance related to: HTN  A second unsuccessful telephone outreach was attempted today to contact the patient who was referred to the pharmacy team for assistance with medication management. Additional attempts will be made to contact the patient.  Thedford Franks, CMA, Care Guide [facility]  [facility], [facility] Guide Direct Dial: [phone]  Fax: [phone] Website: [email or web address]

## 2024-08-09 NOTE — Progress Notes (Signed)
 Care Guide Pharmacy Note  08/09/2024 Name: Marie Torres MRN: [id number] DOB: 01-22-1961  Referred By: Joshua Debby CROME, MD Reason for referral: Call Attempt #1 and Complex Care Management (Outreach to schedule referral with pharmacist )   Marie Torres is a 63 y.o. year old female who is a primary care patient of Joshua Debby CROME, MD.  Marie Torres was referred to the pharmacist for assistance related to: HTN  A third unsuccessful telephone outreach was attempted today to contact the patient who was referred to the pharmacy team for assistance with medication management. The Population Health team is pleased to engage with this patient at any time in the future upon receipt of referral and should he/she be interested in assistance from the Population Health team.  Thedford Franks, CMA [facility]  [facility], [facility] Guide Direct Dial: [phone]  Fax: [phone] Website: [email or web address]

## 2024-09-10 ENCOUNTER — Ambulatory Visit: Payer: Self-pay

## 2024-09-10 NOTE — Telephone Encounter (Signed)
 Pt has been scheduled to see Dr burns next week

## 2024-09-10 NOTE — Telephone Encounter (Signed)
 FYI Only or Action Required?: FYI only for provider: appointment scheduled on 09/13/24.  Patient was last seen in primary care on 08/02/2024 by Joshua Debby CROME, MD.  Called Nurse Triage reporting Diarrhea.  Symptoms began about a month ago.  Interventions attempted: Nothing.  Symptoms are: stable.  Triage Disposition: See PCP When Office is Open (Within 3 Days)  Patient/caregiver understands and will follow disposition?: Yes Reason for Disposition  [1] MILD diarrhea (e.g., 1-3 or more stools than normal in past 24 hours) AND [2] present >  7 days  (Exception: Chronic diarrhea that is not worse.)  Answer Assessment - Initial Assessment Questions Patient states she's lost a whole lot of weight, states 50 pounds over the last couple months. Working 2 jobs and drinks wine at night when getting off work to relax, states new within the last 3 months. Advised patient to not drink wine the next few nights to see if that makes a difference.   1. DIARRHEA SEVERITY: How bad is the diarrhea? How many more stools have you had in the past 24 hours than normal?      1-2 times in the morning  2. ONSET: When did the diarrhea begin?      A month  3. STOOL DESCRIPTION:  How loose or watery is the diarrhea? What is the stool color? Is there any blood or mucous in the stool?     Loose and watery   4. VOMITING: Are you also vomiting? If Yes, ask: How many times in the past 24 hours?      Denies  5. ABDOMEN PAIN: Are you having any abdomen pain? If Yes, ask: What does it feel like? (e.g., crampy, dull, intermittent, constant)      Denies  6. ABDOMEN PAIN SEVERITY: If present, ask: How bad is the pain?  (e.g., Scale 1-10; mild, moderate, or severe)     Denies  7. ANTIBIOTIC USE: Are you taking antibiotics now or have you taken antibiotics in the past 2 months?       Denies  8. OTHER SYMPTOMS: Do you have any other symptoms? (e.g., fever, blood in stool)        Denies  Protocols used: Diarrhea-A-AH

## 2024-09-10 NOTE — Telephone Encounter (Signed)
Please advise as MD is out of office

## 2024-09-10 NOTE — Telephone Encounter (Signed)
 Pt would ideally see PCP please for ROV if symptoms appear to be ongoing      thanks

## 2024-09-12 NOTE — Progress Notes (Unsigned)
    Subjective:    Patient ID: Marie Torres, female    DOB: October 25, 1961, 63 y.o.   MRN: [id number]      HPI Marie Torres is here for No chief complaint on file.        Medications and allergies reviewed with patient and updated if appropriate.  Current Outpatient Medications on File Prior to Visit  Medication Sig Dispense Refill   allopurinol  (ZYLOPRIM ) 300 MG tablet Take 1 tablet (300 mg total) by mouth daily. 90 tablet 0   amLODipine  (NORVASC ) 5 MG tablet Take 1 tablet (5 mg total) by mouth daily. 90 tablet 0   Cholecalciferol  1.25 MG (50000 UT) capsule Take 1 capsule (50,000 Units total) by mouth once a week. 12 capsule 0   meloxicam  (MOBIC ) 15 MG tablet Take 15 mg by mouth as needed for pain.     rosuvastatin  (CRESTOR ) 10 MG tablet Take 1 tablet (10 mg total) by mouth daily. 90 tablet 0   No current facility-administered medications on file prior to visit.    Review of Systems     Objective:  There were no vitals filed for this visit. BP Readings from Last 3 Encounters:  08/02/24 (!) 158/86  05/30/24 (!) 161/84  03/12/24 130/80   Wt Readings from Last 3 Encounters:  08/02/24 160 lb (72.6 kg)  04/28/24 161 lb 9.6 oz (73.3 kg)  03/12/24 165 lb 12.8 oz (75.2 kg)   There is no height or weight on file to calculate BMI.    Physical Exam         Assessment & Plan:    See Problem List for Assessment and Plan of chronic medical problems.     This encounter was created in error - please disregard.

## 2024-09-13 ENCOUNTER — Encounter: Admitting: Internal Medicine

## 2024-09-13 NOTE — Patient Instructions (Signed)
      Blood work was ordered.       Medications changes include :   None    A referral was ordered and someone will call you to schedule an appointment.     No follow-ups on file.

## 2024-10-08 ENCOUNTER — Ambulatory Visit: Admitting: Sports Medicine

## 2024-10-15 NOTE — Progress Notes (Signed)
" °  Subjective Patient ID: Marie Torres is a 63 y.o. female.  Chief Complaint  Patient presents with   Occ med    C/o lower right back pain preventing walking, standing, bending, and twisting.    The following information was reviewed by members of the visit team:  Allergies      HPI - C/o lower right back pain preventing walking, standing, bending, and twisting. 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 Admits to feeling no pain until Thursday night [12/18]. Denies any injury or accident at home or second job (does admit to having a second job). Supervisor states he assigned 3 people to lift those assemblies on Friday and Monday. EE states they only kind of helped. Supervisor called RN to [facility] with wheelchair for EE. EE states she can't walk and even sitting hurts. EE able to stand on own and refused any form of assistance getting to or into wheelchair.  Review of Systems - afebrile, VSS, eyes PERRLA, AAO x3, Unable to perform entirety of CNS check due to no tolerance for movement and squeezing eyes shut. EE denies allergies or taking any medication in last 24 hours. EE denies any previous back injury. EE denies any history of gall bladder, kidney, liver, GI, appendix, spine. EE denies pregnancy or menstruation ability. EE unsure of last BM but convinced she is regular.  Objective Physical Exam - 2 inch moderate tension muscle on left flank between iliac crest, lowest rib,and kidney. No muscle spasms. No left flank pain to palpation. 3 less than 1 inch minor to moderate muscle tension spots noted right lumber region. No muscle spasms. EE reacts with full body jumps to slightest touch on right lumbar region..  Assessment/Plan Diagnoses and all orders for this visit:  Unspecified conditions influencing health status -     naproxen  sodium (ANAPROX ) tablet 220 mg  Other orders -     Ice to affected area with no result or  change in condition. -     Apply heat to affected area with great result and improvement in pain level. Pt able to stand and walk again. Muscle tension in left flank remains, muscle tension in right lumbar greatly reduced. Moderate pain to palpation of right side only.   Electronically signed: Tanda JONELLE Pool, RN 10/15/2024  1:26 PM   "

## 2024-10-16 ENCOUNTER — Emergency Department (HOSPITAL_COMMUNITY)
Admission: EM | Admit: 2024-10-16 | Discharge: 2024-10-17 | Disposition: A | Discharge status: Home / Self Care | Attending: Emergency Medicine | Admitting: Emergency Medicine

## 2024-10-16 ENCOUNTER — Emergency Department (HOSPITAL_COMMUNITY)

## 2024-10-16 ENCOUNTER — Encounter (HOSPITAL_COMMUNITY): Payer: Self-pay | Admitting: *Deleted

## 2024-10-16 ENCOUNTER — Other Ambulatory Visit: Payer: Self-pay

## 2024-10-16 DIAGNOSIS — X500XXA Overexertion from strenuous movement or load, initial encounter: Secondary | ICD-10-CM | POA: Insufficient documentation

## 2024-10-16 DIAGNOSIS — S39012A Strain of muscle, fascia and tendon of lower back, initial encounter: Secondary | ICD-10-CM | POA: Diagnosis not present

## 2024-10-16 DIAGNOSIS — R1031 Right lower quadrant pain: Secondary | ICD-10-CM | POA: Diagnosis not present

## 2024-10-16 DIAGNOSIS — I10 Essential (primary) hypertension: Secondary | ICD-10-CM | POA: Insufficient documentation

## 2024-10-16 DIAGNOSIS — R10A1 Flank pain, right side: Secondary | ICD-10-CM | POA: Diagnosis present

## 2024-10-16 DIAGNOSIS — Y990 Civilian activity done for income or pay: Secondary | ICD-10-CM | POA: Diagnosis not present

## 2024-10-16 LAB — CBC WITH DIFFERENTIAL/PLATELET
Abs Immature Granulocytes: 0.02 K/uL (ref 0.00–0.07)
Basophils Absolute: 0 K/uL (ref 0.0–0.1)
Basophils Relative: 0 %
Eosinophils Absolute: 0.1 K/uL (ref 0.0–0.5)
Eosinophils Relative: 1 %
HCT: 38.7 % (ref 36.0–46.0)
Hemoglobin: 13.2 g/dL (ref 12.0–15.0)
Immature Granulocytes: 0 %
Lymphocytes Relative: 33 %
Lymphs Abs: 3.1 K/uL (ref 0.7–4.0)
MCH: 30.6 pg (ref 26.0–34.0)
MCHC: 34.1 g/dL (ref 30.0–36.0)
MCV: 89.8 fL (ref 80.0–100.0)
Monocytes Absolute: 0.7 K/uL (ref 0.1–1.0)
Monocytes Relative: 7 %
Neutro Abs: 5.5 K/uL (ref 1.7–7.7)
Neutrophils Relative %: 59 %
Platelets: 287 K/uL (ref 150–400)
RBC: 4.31 MIL/uL (ref 3.87–5.11)
RDW: 12.6 % (ref 11.5–15.5)
WBC: 9.4 K/uL (ref 4.0–10.5)
nRBC: 0 % (ref 0.0–0.2)

## 2024-10-16 LAB — COMPREHENSIVE METABOLIC PANEL WITH GFR
ALT: 16 U/L (ref 0–44)
AST: 24 U/L (ref 15–41)
Albumin: 3.9 g/dL (ref 3.5–5.0)
Alkaline Phosphatase: 66 U/L (ref 38–126)
Anion gap: 12 (ref 5–15)
BUN: 12 mg/dL (ref 8–23)
CO2: 22 mmol/L (ref 22–32)
Calcium: 9 mg/dL (ref 8.9–10.3)
Chloride: 107 mmol/L (ref 98–111)
Creatinine, Ser: 0.82 mg/dL (ref 0.44–1.00)
GFR, Estimated: >60 mL/min
Glucose, Bld: 86 mg/dL (ref 70–99)
Potassium: 4.1 mmol/L (ref 3.5–5.1)
Sodium: 142 mmol/L (ref 135–145)
Total Bilirubin: 0.3 mg/dL (ref 0.0–1.2)
Total Protein: 7.1 g/dL (ref 6.5–8.1)

## 2024-10-16 LAB — URINALYSIS, ROUTINE W REFLEX MICROSCOPIC
Bilirubin Urine: NEGATIVE
Glucose, UA: NEGATIVE mg/dL
Hgb urine dipstick: NEGATIVE
Ketones, ur: NEGATIVE mg/dL
Leukocytes,Ua: NEGATIVE
Nitrite: NEGATIVE
Protein, ur: NEGATIVE mg/dL
Specific Gravity, Urine: <1.005 — ABNORMAL LOW (ref 1.005–1.030)
pH: 6 (ref 5.0–8.0)

## 2024-10-16 LAB — LIPASE, BLOOD: Lipase: 30 U/L (ref 11–51)

## 2024-10-16 MED ORDER — MORPHINE SULFATE (PF) 4 MG/ML IV SOLN: 4.0000 mg | Freq: Once | INTRAVENOUS | Status: DC

## 2024-10-16 MED ORDER — OXYCODONE-ACETAMINOPHEN 5-325 MG PO TABS
1.0000 | ORAL_TABLET | Freq: Once | ORAL | Status: DC
  Filled 2024-10-16: qty 1

## 2024-10-16 MED ORDER — LIDOCAINE 5 % EX PTCH
1.0000 | MEDICATED_PATCH | CUTANEOUS | Status: DC
  Administered 2024-10-16: 1 via TRANSDERMAL
  Filled 2024-10-16: qty 1

## 2024-10-16 MED ORDER — LIDOCAINE 5 % EX PTCH: 1.0000 | MEDICATED_PATCH | CUTANEOUS | 0 refills | Status: AC

## 2024-10-16 NOTE — ED Provider Notes (Signed)
 " [facility] Provider Note   CSN: [id number] Arrival date & time: 10/16/24  1918     Patient presents with: Back Pain   Marie Torres is a 63 y.o. female.   Patient with history of GERD, hypertension presents today with complaints of right sided back/hip/abdominal pain. Reports that pain began initially 1 week ago when she was was lifting boxes at work. Reports that she went to employee health and she was told to rest and use a heating pad which she has been doing without improvement. Also took an aleve  with minimal improvement. Denies any pain radiating down her extremities or numbness/tingling. No nausea, vomiting, or diarrhea. No weakness in her extremities. No fevers or chills.  No loss of bowel or bladder function or saddle anesthesia.  The history is provided by the patient. No language interpreter was used.  Back Pain      Prior to Admission medications  Medication Sig Start Date End Date Taking? Authorizing Provider  allopurinol  (ZYLOPRIM ) 300 MG tablet Take 1 tablet (300 mg total) by mouth daily. 08/03/24   Joshua Debby CROME, MD  amLODipine  (NORVASC ) 5 MG tablet Take 1 tablet (5 mg total) by mouth daily. 08/03/24   Joshua Debby CROME, MD  Cholecalciferol  1.25 MG (50000 UT) capsule Take 1 capsule (50,000 Units total) by mouth once a week. 08/03/24   Joshua Debby CROME, MD  meloxicam  (MOBIC ) 15 MG tablet Take 15 mg by mouth as needed for pain.    [provider]  rosuvastatin  (CRESTOR ) 10 MG tablet Take 1 tablet (10 mg total) by mouth daily. 08/03/24   Joshua Debby CROME, MD    Allergies: Shellfish allergy    Review of Systems  Musculoskeletal:  Positive for back pain.  All other systems reviewed and are negative.   Updated Vital Signs BP (!) 160/82 (BP Location: Right Arm)   Pulse 92   Temp (!) 97.4 F (36.3 C)   Resp 16   Ht 5' 1 (1.549 m)   Wt 68.9 kg   SpO2 96%   BMI 28.70 kg/m   Physical Exam Vitals and nursing note  reviewed.  Constitutional:      General: She is not in acute distress.    Appearance: Normal appearance. She is normal weight. She is not ill-appearing, toxic-appearing or diaphoretic.  HENT:     Head: Normocephalic and atraumatic.  Cardiovascular:     Rate and Rhythm: Normal rate.  Pulmonary:     Effort: Pulmonary effort is normal. No respiratory distress.  Abdominal:     Tenderness: There is abdominal tenderness in the right lower quadrant. There is no guarding or rebound.  Musculoskeletal:        General: Normal range of motion.     Cervical back: Normal range of motion.     Comments: TTP noted to the lower lumbar spine, associated right sided perispinous muscles, and right flank and right hip.  No step-offs, lesions, deformity, or overlying skin changes.  DP and PT pulses intact and 2+ bilaterally. 5/5 strength and sensation noted to BLE. Negative SLR. Ambulatory   Skin:    General: Skin is warm and dry.  Neurological:     General: No focal deficit present.     Mental Status: She is alert.  Psychiatric:        Mood and Affect: Mood normal.        Behavior: Behavior normal.     (all labs ordered are  listed, but only abnormal results are displayed) Labs Reviewed  URINALYSIS, ROUTINE W REFLEX MICROSCOPIC - Abnormal; Notable for the following components:      Result Value   Specific Gravity, Urine <1.005 (*)    All other components within normal limits  COMPREHENSIVE METABOLIC PANEL WITH GFR  LIPASE, BLOOD  CBC WITH DIFFERENTIAL/PLATELET    EKG: None  Radiology: CT L-SPINE NO CHARGE Result Date: 10/16/2024 EXAM: CT OF THE LUMBAR SPINE WITHOUT CONTRAST 10/16/2024 10:18:46 PM TECHNIQUE: CT of the lumbar spine was performed without the administration of intravenous contrast. Multiplanar reformatted images are provided for review. Automated exposure control, iterative reconstruction, and/or weight based adjustment of the mA/kV was utilized to reduce the radiation dose to as  low as reasonably achievable. COMPARISON: None available. CLINICAL HISTORY: FINDINGS: BONES AND ALIGNMENT: Normal vertebral body heights. No acute fracture or suspicious bone lesion. Normal alignment. DEGENERATIVE CHANGES: Mild facet arthropathy at L4-L5 and L5-S1. Intervertebral disc space height is maintained. No severe spinal canal narrowing. No severe neural foraminal narrowing. SOFT TISSUES: No acute abnormality. IMPRESSION: 1. No acute findings. Electronically signed by: Norman Gatlin MD 10/16/2024 10:33 PM EST RP Workstation: [id number]   CT Renal Stone Study Result Date: 10/16/2024 EXAM: CT UROGRAM 10/16/2024 10:18:46 PM TECHNIQUE: CT of the abdomen and pelvis was performed without the administration of intravenous contrast as per CT urogram protocol. Multiplanar reformatted images as well as MIP urogram images are provided for review. Automated exposure control, iterative reconstruction, and/or weight based adjustment of the mA/kV was utilized to reduce the radiation dose to as low as reasonably achievable. COMPARISON: None available. CLINICAL HISTORY: Abdominal/flank pain, stone suspected. FINDINGS: LOWER CHEST: No acute abnormality. LIVER: The liver is unremarkable. GALLBLADDER AND BILE DUCTS: Gallbladder is unremarkable. No biliary ductal dilatation. SPLEEN: No acute abnormality. PANCREAS: No acute abnormality. ADRENAL GLANDS: No acute abnormality. KIDNEYS, URETERS AND BLADDER: No stones in the kidneys or ureters. No hydronephrosis. No perinephric or periureteral stranding. Urinary bladder is unremarkable. GI AND BOWEL: Stomach demonstrates no acute abnormality. Colonic diverticulosis without evidence of diverticulitis. Normal appendix. There is no bowel obstruction. PERITONEUM AND RETROPERITONEUM: No ascites. No free air. VASCULATURE: Aortic atherosclerotic calcification. Aorta is normal in caliber. LYMPH NODES: No lymphadenopathy. REPRODUCTIVE ORGANS: No acute abnormality. BONES AND SOFT TISSUES:  No acute osseous abnormality. No focal soft tissue abnormality. IMPRESSION: 1. No acute abnormality. Electronically signed by: Norman Gatlin MD 10/16/2024 10:31 PM EST RP Workstation: [id number]   DG Hip Unilat W or Wo Pelvis 2-3 Views Right Result Date: 10/16/2024 EXAM: 2 or more VIEW(S) XRAY OF THE HIPS 10/16/2024 08:17:00 PM COMPARISON: None available. CLINICAL HISTORY: pain FINDINGS: BONES AND JOINTS: No acute fracture. No malalignment. Heterotopic ossification adjacent to the left greater trochanter. Mild degenerative changes of the right hip. SOFT TISSUES: The soft tissues are unremarkable. IMPRESSION: 1. Mild degenerative changes of the right hip. No acute abnormality noted. Electronically signed by: Oneil Devonshire MD 10/16/2024 08:28 PM EST RP Workstation: [id number]     Procedures   Medications Ordered in the ED  oxyCODONE -acetaminophen  (PERCOCET/ROXICET) 5-325 MG per tablet 1 tablet (1 tablet Oral Patient Refused/Not Given 10/16/24 2217)  morphine  (PF) 4 MG/ML injection 4 mg (4 mg Intravenous Patient Refused/Not Given 10/16/24 2218)  lidocaine  (LIDODERM ) 5 % 1 patch (1 patch Transdermal Patch Applied 10/16/24 2232)  Medical Decision Making Amount and/or Complexity of Data Reviewed Labs: ordered. Radiology: ordered.  Risk Prescription drug management.   This patient is a 63 y.o. female who presents to the ED for concern of back pain, abdominal pain, this involves an extensive number of treatment options, and is a complaint that carries with it a high risk of complications and morbidity. The emergent differential diagnosis prior to evaluation includes, but is not limited to,  AAA, renal vascular thrombosis, mesenteric ischemia, pyelonephritis, nephrolithiasis, cystitis, biliary colic, pancreatitis, PUD, appendicitis, diverticulitis, bowel obstruction, Ectopic Pregnancy, PID/TOA, Ovarian cyst, Ovarian torsion  This is not an exhaustive  differential.   Past Medical History / Co-morbidities / Social History:  has a past medical history of Arthritis, Chicken pox, Diverticulosis of colon (without mention of hemorrhage), GERD (gastroesophageal reflux disease), Hiatal hernia, Hyperplastic colon polyp, Hypertension, Mass of finger of right hand (02/2013), Seasonal allergies, Sinus congestion (03/16/2013), Urine incontinence, Vitamin D  deficiency, and Wears partial dentures.  Additional history: Chart reviewed. Pertinent results include: seen yesterday for these complaints and prescribed naproxen   Physical Exam: Physical exam performed. The pertinent findings include:   RLQ TTP, no rebound or guarding  TTP noted to the lower lumbar spine, associated right sided perispinous muscles, and right flank and right hip.  No step-offs, lesions, deformity, or overlying skin changes.  DP and PT pulses intact and 2+ bilaterally. 5/5 strength and sensation noted to BLE. Negative SLR. Ambulatory   Lab Tests: I ordered, and personally interpreted labs.  The pertinent results include: No acute laboratory abnormalities   Imaging Studies: I ordered imaging studies including DG right hip, CT renal and l spine. I independently visualized and interpreted imaging which showed   DG right hip: 1. Mild degenerative changes of the right hip. No acute abnormality noted.   CT renal: negative  CT l spine: negative  I agree with the radiologist interpretation.   Medications: Offered pain medication which patient declined, opted for lidocaine  patch instead   Disposition: After consideration of the diagnostic results and the patients response to treatment, I feel that emergency department workup does not suggest an emergent condition requiring admission or immediate intervention beyond what has been performed at this time. The plan is: Discharge with close outpatient follow-up and return precautions.  Patient's workup is benign, she has no neurologic  deficits to suspect cauda equina, no indication to warrant further evaluation with MRI imaging.  Discussed same with patient who is understanding and in agreement.  Will prescribe lidocaine  patches per her request as well.  Suspect muscle strain.  Evaluation and diagnostic testing in the emergency department does not suggest an emergent condition requiring admission or immediate intervention beyond what has been performed at this time.  Plan for discharge with close PCP follow-up.  Patient is understanding and amenable with plan, educated on red flag symptoms that would prompt immediate return.  Patient discharged in stable condition.  Final diagnoses:  Strain of lumbar region, initial encounter    ED Discharge Orders          Ordered    lidocaine  (LIDODERM ) 5 %  Every 24 hours        10/16/24 2314          An After Visit Summary was printed and given to the patient.      Nora Lauraine DELENA DEVONNA 10/16/24 2315    Yolande Lamar BROCKS, MD 10/23/24 [phone]  "

## 2024-10-16 NOTE — ED Triage Notes (Signed)
 The pt is c/o rt hip pain

## 2024-10-16 NOTE — ED Triage Notes (Signed)
 Complaining of low back pain from lifting boxes at work. Has been hurting for the last 2 - 3 days.

## 2024-10-16 NOTE — Progress Notes (Signed)
 Subjective:   Patient ID: Marie Torres is a 63 y.o. female.  Objective:   Physical Exam Vitals reviewed.  Constitutional:      Appearance: Normal appearance.  Eyes:     Pupils: Pupils are equal, round, and reactive to light.  Cardiovascular:     Rate and Rhythm: Normal rate and regular rhythm.  Pulmonary:     Effort: Pulmonary effort is normal.  Musculoskeletal:        General: Normal range of motion.  Neurological:     Mental Status: She is alert and oriented to person, place, and time.       Assessment/Plan:   Employee voiced wanting more heat therapy, that it helped yesterday. Heat applied x10 mins.  Oral Aleve  220mg  in clinic administered. Icy-hot XL applied to right hip per request.   Her supervisor has her on a different job duty today which is easier work for her. She is off on Sunday and plans to rest   Diagnoses and all orders for this visit:  Problems influencing health status -     naproxen  sodium (ANAPROX ) tablet 220 mg  Other orders -     allopurinoL  (ZYLOPRIM ) 300 mg tablet; Take 300 mg by mouth daily. -     amLODIPine  (NORVASC ) 5 mg tablet; Take 5 mg by mouth daily.

## 2024-10-16 NOTE — Discharge Instructions (Addendum)
 Your back pain is most likely due to a muscular strain.  There is been a lot of research on back pain, unfortunately the only thing that seems to really help is Tylenol  and ibuprofen .  Relative rest is also important to not lift greater than 10 pounds bending or twisting at the waist.  Please follow-up with your family physician.  The other thing that really seems to benefit patients is physical therapy which your doctor may send you for.  Please return to the emergency department for new numbness or weakness to your arms or legs. Difficulty with urinating or urinating or pooping on yourself.  Also if you cannot feel toilet paper when you wipe or get a fever.   Take 4 over the counter ibuprofen  tablets 3 times a day or 2 over-the-counter naproxen  tablets twice a day for pain. Also take tylenol  1000mg (2 extra strength) four times a day.   I have also given you a prescription for lidocaine  patches that you can wear over the area that hurts.  Return if development of any new or worsening symptoms.

## 2024-10-16 NOTE — ED Provider Triage Note (Signed)
 Emergency Medicine Provider Triage Evaluation Note  Marie Torres , a 63 y.o. female  was evaluated in triage.  Pt complains of hip pain. Pain to R hip after heavy lifting a few days ago, pain worse with movement, non radiating. Also have urge to urinate without burning on urination.  No fever, chills, leg pain, focal numbness or focal weakness or rash.  Review of Systems  Positive: As above Negative: As above  Physical Exam  BP (!) 160/82 (BP Location: Right Arm)   Pulse 92   Temp (!) 97.4 F (36.3 C)   Resp 16   Ht 5' 1 (1.549 m)   Wt 68.9 kg   SpO2 96%   BMI 28.70 kg/m  Gen:   Awake, no distress   Resp:  Normal effort  MSK:   Moves extremities without difficulty  Other:  Tearful, able to ambulate  Medical Decision Making  Medically screening exam initiated at 7:37 PM.  Appropriate orders placed.  MALAIYA PACZKOWSKI was informed that the remainder of the evaluation will be completed by another provider, this initial triage assessment does not replace that evaluation, and the importance of remaining in the ED until their evaluation is complete.     Nivia Colon, PA-C 10/16/24 [phone]

## 2024-10-20 ENCOUNTER — Ambulatory Visit: Admitting: Internal Medicine

## 2024-10-20 ENCOUNTER — Encounter: Payer: Self-pay | Admitting: Internal Medicine

## 2024-10-20 ENCOUNTER — Other Ambulatory Visit (HOSPITAL_COMMUNITY): Payer: Self-pay

## 2024-10-20 ENCOUNTER — Telehealth: Payer: Self-pay

## 2024-10-20 VITALS — BP 152/88 | HR 65 | Temp 98.0°F | Resp 16 | Ht 61.0 in | Wt 154.0 lb

## 2024-10-20 DIAGNOSIS — I10 Essential (primary) hypertension: Secondary | ICD-10-CM

## 2024-10-20 DIAGNOSIS — E785 Hyperlipidemia, unspecified: Secondary | ICD-10-CM | POA: Diagnosis not present

## 2024-10-20 DIAGNOSIS — K635 Polyp of colon: Secondary | ICD-10-CM | POA: Diagnosis not present

## 2024-10-20 DIAGNOSIS — M1A09X0 Idiopathic chronic gout, multiple sites, without tophus (tophi): Secondary | ICD-10-CM

## 2024-10-20 DIAGNOSIS — M150 Primary generalized (osteo)arthritis: Secondary | ICD-10-CM | POA: Diagnosis not present

## 2024-10-20 MED ORDER — TRAMADOL HCL ER 100 MG PO TB24: 100.0000 mg | ORAL_TABLET | Freq: Every day | ORAL | 0 refills | Status: AC | PRN

## 2024-10-20 MED ORDER — ROSUVASTATIN CALCIUM 10 MG PO TABS: 10.0000 mg | ORAL_TABLET | Freq: Every day | ORAL | 0 refills | Status: AC

## 2024-10-20 MED ORDER — TRIAMTERENE-HCTZ 37.5-25 MG PO CAPS: 1.0000 | ORAL_CAPSULE | Freq: Every day | ORAL | 0 refills | Status: AC

## 2024-10-20 MED ORDER — ALLOPURINOL 300 MG PO TABS: 300.0000 mg | ORAL_TABLET | Freq: Every day | ORAL | 0 refills | Status: AC

## 2024-10-20 MED ORDER — AMLODIPINE BESYLATE 5 MG PO TABS: 5.0000 mg | ORAL_TABLET | Freq: Every day | ORAL | 0 refills | Status: AC

## 2024-10-20 NOTE — Progress Notes (Unsigned)
 "  Subjective:  Patient ID: Marie Torres, female    DOB: [birth year]  Age: 63 y.o. MRN: [id number]  CC: Back Pain (R lower back pain for a week. Started while she was at work ), Hypertension, and Hyperlipidemia   HPI Marie Torres presents for f/up ----  Discussed the use of AI scribe software for clinical note transcription with the patient, who gave verbal consent to proceed.  History of Present Illness Marie Torres is a 63 year old female who presents with low back pain after lifting heavy panels at work.  She has been experiencing low back pain since last week after lifting heavy panels at work. The pain is described as an ache located in the lower back without radiation. It is exacerbated by bending down and returning to an upright position. No numbness, weakness, or tingling is associated with the pain.  She has been using lidocaine  patches for pain relief but has not been taking any oral medications. She expresses a need for additional pain management, stating 'I need a bag' when describing the pain experienced during certain movements.  She reports frequent bowel movements, which she associates with a shot she received, although she is uncertain about the details of the injection. No abdominal pain, nausea, or vomiting is present.  No radiation of the back pain, numbness, weakness, tingling, headache, blurred vision, abdominal pain, nausea, or vomiting. She reports frequent bowel movements.     Outpatient Medications Prior to Visit  Medication Sig Dispense Refill   lidocaine  (LIDODERM ) 5 % Place 1 patch onto the skin daily. Remove & Discard patch within 12 hours or as directed by MD 30 patch 0   Cholecalciferol  1.25 MG (50000 UT) capsule Take 1 capsule (50,000 Units total) by mouth once a week. (Patient not taking: Reported on 10/20/2024) 12 capsule 0   allopurinol  (ZYLOPRIM ) 300 MG tablet Take 1 tablet (300 mg total) by mouth daily. (Patient not taking: Reported on  10/20/2024) 90 tablet 0   amLODipine  (NORVASC ) 5 MG tablet Take 1 tablet (5 mg total) by mouth daily. (Patient not taking: Reported on 10/20/2024) 90 tablet 0   meloxicam  (MOBIC ) 15 MG tablet Take 15 mg by mouth as needed for pain. (Patient not taking: Reported on 10/20/2024)     rosuvastatin  (CRESTOR ) 10 MG tablet Take 1 tablet (10 mg total) by mouth daily. (Patient not taking: Reported on 10/20/2024) 90 tablet 0   No facility-administered medications prior to visit.    ROS Review of Systems  Constitutional:  Negative for appetite change, chills, diaphoresis, fatigue and fever.  HENT: Negative.  Negative for trouble swallowing.   Eyes: Negative.   Respiratory: Negative.  Negative for cough, chest tightness, shortness of breath and wheezing.   Cardiovascular:  Negative for chest pain, palpitations and leg swelling.  Gastrointestinal: Negative.  Negative for abdominal pain, constipation, diarrhea, nausea and vomiting.  Endocrine: Negative.   Genitourinary:  Negative for difficulty urinating and dysuria.  Musculoskeletal:  Positive for arthralgias and back pain. Negative for joint swelling and myalgias.  Neurological:  Negative for dizziness, weakness, light-headedness and headaches.  Hematological:  Negative for adenopathy. Does not bruise/bleed easily.  Psychiatric/Behavioral: Negative.      Objective:  BP (!) 152/88 (BP Location: Left Arm, Patient Position: Sitting, Cuff Size: Normal)   Pulse 65   Temp 98 F (36.7 C) (Oral)   Resp 16   Ht 5' 1 (1.549 m)   Wt 154 lb (69.9 kg)   SpO2  97%   BMI 29.10 kg/m   BP Readings from Last 3 Encounters:  10/20/24 (!) 152/88  10/17/24 (!) 158/89  09/13/24 (!) 142/78    Wt Readings from Last 3 Encounters:  10/20/24 154 lb (69.9 kg)  10/16/24 151 lb 14.4 oz (68.9 kg)  09/13/24 152 lb (68.9 kg)    Physical Exam Vitals reviewed.  Constitutional:      Appearance: Normal appearance.  HENT:     Nose: Nose normal.     Mouth/Throat:      Mouth: Mucous membranes are moist.  Eyes:     General: No scleral icterus.    Conjunctiva/sclera: Conjunctivae normal.  Cardiovascular:     Rate and Rhythm: Normal rate and regular rhythm.     Heart sounds: No murmur heard.    No friction rub. No gallop.  Pulmonary:     Effort: Pulmonary effort is normal.     Breath sounds: No stridor. No wheezing, rhonchi or rales.  Abdominal:     General: Abdomen is flat.     Palpations: There is no mass.     Tenderness: There is no abdominal tenderness. There is no guarding.     Hernia: No hernia is present.  Musculoskeletal:        General: Normal range of motion.     Cervical back: Neck supple.     Thoracic back: Normal.     Lumbar back: Normal. No tenderness or bony tenderness. Normal range of motion. Negative right straight leg raise test and negative left straight leg raise test.     Right lower leg: No edema.     Left lower leg: No edema.  Lymphadenopathy:     Cervical: No cervical adenopathy.  Skin:    General: Skin is warm and dry.  Neurological:     General: No focal deficit present.     Mental Status: She is alert. Mental status is at baseline.  Psychiatric:        Mood and Affect: Mood normal.        Behavior: Behavior normal.     Lab Results  Component Value Date   WBC 9.4 10/16/2024   HGB 13.2 10/16/2024   HCT 38.7 10/16/2024   PLT 287 10/16/2024   GLUCOSE 86 10/16/2024   CHOL 199 08/02/2024   TRIG 165.0 (H) 08/02/2024   HDL 78.00 08/02/2024   LDLDIRECT 150.8 11/30/2013   LDLCALC 88 08/02/2024   ALT 16 10/16/2024   AST 24 10/16/2024   NA 142 10/16/2024   K 4.1 10/16/2024   CL 107 10/16/2024   CREATININE 0.82 10/16/2024   BUN 12 10/16/2024   CO2 22 10/16/2024   TSH 1.58 08/02/2024   HGBA1C 5.7 04/29/2019    CT L-SPINE NO CHARGE Result Date: 10/16/2024 EXAM: CT OF THE LUMBAR SPINE WITHOUT CONTRAST 10/16/2024 10:18:46 PM TECHNIQUE: CT of the lumbar spine was performed without the administration of  intravenous contrast. Multiplanar reformatted images are provided for review. Automated exposure control, iterative reconstruction, and/or weight based adjustment of the mA/kV was utilized to reduce the radiation dose to as low as reasonably achievable. COMPARISON: None available. CLINICAL HISTORY: FINDINGS: BONES AND ALIGNMENT: Normal vertebral body heights. No acute fracture or suspicious bone lesion. Normal alignment. DEGENERATIVE CHANGES: Mild facet arthropathy at L4-L5 and L5-S1. Intervertebral disc space height is maintained. No severe spinal canal narrowing. No severe neural foraminal narrowing. SOFT TISSUES: No acute abnormality. IMPRESSION: 1. No acute findings. Electronically signed by: Norman Gatlin MD 10/16/2024 10:33 PM  EST RP Workstation: [id number]   CT Renal Stone Study Result Date: 10/16/2024 EXAM: CT UROGRAM 10/16/2024 10:18:46 PM TECHNIQUE: CT of the abdomen and pelvis was performed without the administration of intravenous contrast as per CT urogram protocol. Multiplanar reformatted images as well as MIP urogram images are provided for review. Automated exposure control, iterative reconstruction, and/or weight based adjustment of the mA/kV was utilized to reduce the radiation dose to as low as reasonably achievable. COMPARISON: None available. CLINICAL HISTORY: Abdominal/flank pain, stone suspected. FINDINGS: LOWER CHEST: No acute abnormality. LIVER: The liver is unremarkable. GALLBLADDER AND BILE DUCTS: Gallbladder is unremarkable. No biliary ductal dilatation. SPLEEN: No acute abnormality. PANCREAS: No acute abnormality. ADRENAL GLANDS: No acute abnormality. KIDNEYS, URETERS AND BLADDER: No stones in the kidneys or ureters. No hydronephrosis. No perinephric or periureteral stranding. Urinary bladder is unremarkable. GI AND BOWEL: Stomach demonstrates no acute abnormality. Colonic diverticulosis without evidence of diverticulitis. Normal appendix. There is no bowel obstruction. PERITONEUM  AND RETROPERITONEUM: No ascites. No free air. VASCULATURE: Aortic atherosclerotic calcification. Aorta is normal in caliber. LYMPH NODES: No lymphadenopathy. REPRODUCTIVE ORGANS: No acute abnormality. BONES AND SOFT TISSUES: No acute osseous abnormality. No focal soft tissue abnormality. IMPRESSION: 1. No acute abnormality. Electronically signed by: Norman Gatlin MD 10/16/2024 10:31 PM EST RP Workstation: [id number]   DG Hip Unilat W or Wo Pelvis 2-3 Views Right Result Date: 10/16/2024 EXAM: 2 or more VIEW(S) XRAY OF THE HIPS 10/16/2024 08:17:00 PM COMPARISON: None available. CLINICAL HISTORY: pain FINDINGS: BONES AND JOINTS: No acute fracture. No malalignment. Heterotopic ossification adjacent to the left greater trochanter. Mild degenerative changes of the right hip. SOFT TISSUES: The soft tissues are unremarkable. IMPRESSION: 1. Mild degenerative changes of the right hip. No acute abnormality noted. Electronically signed by: Oneil Devonshire MD 10/16/2024 08:28 PM EST RP Workstation: [id number]   Estimated Creatinine Clearance: 61.1 mL/min (by C-G formula based on SCr of 0.82 mg/dL).   Assessment & Plan:  Hyperplastic colonic polyp, unspecified part of colon -     Ambulatory referral to Gastroenterology  Essential hypertension- She has not achieved her BP goal. -     amLODIPine  Besylate; Take 1 tablet (5 mg total) by mouth daily.  Dispense: 90 tablet; Refill: 0 -     Triamterene -HCTZ; Take 1 each (1 capsule total) by mouth daily.  Dispense: 90 capsule; Refill: 0 -     AMB Referral [facility] Care Management  Hyperlipidemia with target LDL less than 100 -     Rosuvastatin  Calcium ; Take 1 tablet (10 mg total) by mouth daily.  Dispense: 90 tablet; Refill: 0 -     AMB Referral [facility] Care Management  Primary osteoarthritis involving multiple joints -     traMADol  HCl ER; Take 1 tablet (100 mg total) by mouth daily as needed for pain.  Dispense: 90 tablet; Refill: 0 -     AMB Referral [facility] Care  Management  Idiopathic chronic gout of multiple sites without tophus -     Allopurinol ; Take 1 tablet (300 mg total) by mouth daily.  Dispense: 90 tablet; Refill: 0     Follow-up: Return in about 3 months (around 01/18/2025).  Debby Molt, MD "

## 2024-10-20 NOTE — Telephone Encounter (Signed)
 Pharmacy Patient Advocate Encounter   Received notification from CoverMyMeds that prior authorization for Tramadol  100mg  ER tab is required/requested.   Insurance verification completed.   The patient is insured through [facility].   Per test claim: PA required; PA submitted to above mentioned insurance via Latent Key/confirmation #/[id number] Status is pending

## 2024-10-20 NOTE — Patient Instructions (Signed)
 Hypertension, Adult High blood pressure (hypertension) is when the force of blood pumping through the arteries is too strong. The arteries are the blood vessels that carry blood from the heart throughout the body. Hypertension forces the heart to work harder to pump blood and may cause arteries to become narrow or stiff. Untreated or uncontrolled hypertension can lead to a heart attack, heart failure, a stroke, kidney disease, and other problems. A blood pressure reading consists of a higher number over a lower number. Ideally, your blood pressure should be below 120/80. The first ("top") number is called the systolic pressure. It is a measure of the pressure in your arteries as your heart beats. The second ("bottom") number is called the diastolic pressure. It is a measure of the pressure in your arteries as the heart relaxes. What are the causes? The exact cause of this condition is not known. There are some conditions that result in high blood pressure. What increases the risk? Certain factors may make you more likely to develop high blood pressure. Some of these risk factors are under your control, including: Smoking. Not getting enough exercise or physical activity. Being overweight. Having too much fat, sugar, calories, or salt (sodium) in your diet. Drinking too much alcohol. Other risk factors include: Having a personal history of heart disease, diabetes, high cholesterol, or kidney disease. Stress. Having a family history of high blood pressure and high cholesterol. Having obstructive sleep apnea. Age. The risk increases with age. What are the signs or symptoms? High blood pressure may not cause symptoms. Very high blood pressure (hypertensive crisis) may cause: Headache. Fast or irregular heartbeats (palpitations). Shortness of breath. Nosebleed. Nausea and vomiting. Vision changes. Severe chest pain, dizziness, and seizures. How is this diagnosed? This condition is diagnosed by  measuring your blood pressure while you are seated, with your arm resting on a flat surface, your legs uncrossed, and your feet flat on the floor. The cuff of the blood pressure monitor will be placed directly against the skin of your upper arm at the level of your heart. Blood pressure should be measured at least twice using the same arm. Certain conditions can cause a difference in blood pressure between your right and left arms. If you have a high blood pressure reading during one visit or you have normal blood pressure with other risk factors, you may be asked to: Return on a different day to have your blood pressure checked again. Monitor your blood pressure at home for 1 week or longer. If you are diagnosed with hypertension, you may have other blood or imaging tests to help your health care provider understand your overall risk for other conditions. How is this treated? This condition is treated by making healthy lifestyle changes, such as eating healthy foods, exercising more, and reducing your alcohol intake. You may be referred for counseling on a healthy diet and physical activity. Your health care provider may prescribe medicine if lifestyle changes are not enough to get your blood pressure under control and if: Your systolic blood pressure is above 130. Your diastolic blood pressure is above 80. Your personal target blood pressure may vary depending on your medical conditions, your age, and other factors. Follow these instructions at home: Eating and drinking  Eat a diet that is high in fiber and potassium, and low in sodium, added sugar, and fat. An example of this eating plan is called the DASH diet. DASH stands for Dietary Approaches to Stop Hypertension. To eat this way: Eat  plenty of fresh fruits and vegetables. Try to fill one half of your plate at each meal with fruits and vegetables. Eat whole grains, such as whole-wheat pasta, brown rice, or whole-grain bread. Fill about one  fourth of your plate with whole grains. Eat or drink low-fat dairy products, such as skim milk or low-fat yogurt. Avoid fatty cuts of meat, processed or cured meats, and poultry with skin. Fill about one fourth of your plate with lean proteins, such as fish, chicken without skin, beans, eggs, or tofu. Avoid pre-made and processed foods. These tend to be higher in sodium, added sugar, and fat. Reduce your daily sodium intake. Many people with hypertension should eat less than 1,500 mg of sodium a day. Do not drink alcohol if: Your health care provider tells you not to drink. You are pregnant, may be pregnant, or are planning to become pregnant. If you drink alcohol: Limit how much you have to: 0-1 drink a day for women. 0-2 drinks a day for men. Know how much alcohol is in your drink. In the [place] one drink equals one 12 oz bottle of beer (355 mL), one 5 oz glass of wine (148 mL), or one 1 oz glass of hard liquor (44 mL). Lifestyle  Work with your health care provider to maintain a healthy body weight or to lose weight. Ask what an ideal weight is for you. Get at least 30 minutes of exercise that causes your heart to beat faster (aerobic exercise) most days of the week. Activities may include walking, swimming, or biking. Include exercise to strengthen your muscles (resistance exercise), such as Pilates or lifting weights, as part of your weekly exercise routine. Try to do these types of exercises for 30 minutes at least 3 days a week. Do not use any products that contain nicotine or tobacco. These products include cigarettes, chewing tobacco, and vaping devices, such as e-cigarettes. If you need help quitting, ask your health care provider. Monitor your blood pressure at home as told by your health care provider. Keep all follow-up visits. This is important. Medicines Take over-the-counter and prescription medicines only as told by your health care provider. Follow directions carefully. Blood  pressure medicines must be taken as prescribed. Do not skip doses of blood pressure medicine. Doing this puts you at risk for problems and can make the medicine less effective. Ask your health care provider about side effects or reactions to medicines that you should watch for. Contact a health care provider if you: Think you are having a reaction to a medicine you are taking. Have headaches that keep coming back (recurring). Feel dizzy. Have swelling in your ankles. Have trouble with your vision. Get help right away if you: Develop a severe headache or confusion. Have unusual weakness or numbness. Feel faint. Have severe pain in your chest or abdomen. Vomit repeatedly. Have trouble breathing. These symptoms may be an emergency. Get help right away. Call 911. Do not wait to see if the symptoms will go away. Do not drive yourself to the hospital. Summary Hypertension is when the force of blood pumping through your arteries is too strong. If this condition is not controlled, it may put you at risk for serious complications. Your personal target blood pressure may vary depending on your medical conditions, your age, and other factors. For most people, a normal blood pressure is less than 120/80. Hypertension is treated with lifestyle changes, medicines, or a combination of both. Lifestyle changes include losing weight, eating a healthy,  low-sodium diet, exercising more, and limiting alcohol. This information is not intended to replace advice given to you by your health care provider. Make sure you discuss any questions you have with your health care provider. Document Revised: 08/21/2021 Document Reviewed: 08/21/2021 Elsevier Patient Education  2024 [employer].

## 2024-10-20 NOTE — Telephone Encounter (Signed)
 Pharmacy Patient Advocate Encounter  Received notification from [facility] that Prior Authorization for Tramadol  100mg  ER tabs has been APPROVED from 10/20/24 to 04/20/25   PA #/Case ID/Reference #: [id number]

## 2024-10-22 NOTE — Telephone Encounter (Signed)
 Unable to reach patient. Left a very detailed message about the patient being approved for her medication.

## 2024-10-27 ENCOUNTER — Telehealth: Payer: Self-pay | Admitting: *Deleted

## 2024-10-27 NOTE — Progress Notes (Signed)
 Care Guide Pharmacy Note  10/27/2024 Name: STEPHANE JUNKINS MRN: [id number] DOB: 04-29-1961  Referred By: Joshua Debby CROME, MD Reason for referral: Call Attempt #1 and Complex Care Management (Outreach to schedule referral with pharmacist )   MELVINIA ASHBY is a 63 y.o. year old female who is a primary care patient of Joshua Debby CROME, MD.  Sharlet JINNY Gavel was referred to the pharmacist for assistance related to: HTN  An unsuccessful telephone outreach was attempted today to contact the patient who was referred to the pharmacy team for assistance with medication management. Additional attempts will be made to contact the patient.  Thedford Franks, CMA [place]  [facility], [facility] Guide Direct Dial: [phone]  Fax: [phone] Website: [email or web address]

## 2024-10-29 NOTE — Progress Notes (Signed)
 Care Guide Pharmacy Note  10/29/2024 Name: Marie Torres MRN: [id number] DOB: 1961-04-25  Referred By: Joshua Debby CROME, MD Reason for referral: Call Attempt #1 and Complex Care Management (Outreach to schedule referral with pharmacist )   Marie Torres is a 64 y.o. year old female who is a primary care patient of Joshua Debby CROME, MD.  Marie Torres was referred to the pharmacist for assistance related to: HTN  A second unsuccessful telephone outreach was attempted today to contact the patient who was referred to the pharmacy team for assistance with medication management. Additional attempts will be made to contact the patient.  Thedford Franks, CMA [place]  [facility], [facility] Guide Direct Dial: [phone]  Fax: [phone] Website: [email or web address]

## 2024-11-01 NOTE — Progress Notes (Signed)
 Care Guide Pharmacy Note  11/01/2024 Name: CUMI SANAGUSTIN MRN: [id number] DOB: [birth year]  Referred By: Joshua Debby CROME, MD Reason for referral: Call Attempt #1 and Complex Care Management (Outreach to schedule referral with pharmacist )   NIKCOLE EISCHEID is a 64 y.o. year old female who is a primary care patient of Joshua Debby CROME, MD.  Sharlet JINNY Gavel was referred to the pharmacist for assistance related to: HTN  A third unsuccessful telephone outreach was attempted today to contact the patient who was referred to the pharmacy team for assistance with medication management. The Population Health team is pleased to engage with this patient at any time in the future upon receipt of referral and should he/she be interested in assistance from the Population Health team.  Thedford Franks, CMA [facility]  [facility], [facility] Guide, Lead Direct Dial: [phone]  Fax: [phone]
